# Patient Record
Sex: Male | Born: 1965 | Hispanic: No | Marital: Married
Health system: Midwestern US, Community
[De-identification: ages and names within clinical notes are randomized; demographics above are authoritative.]

## PROBLEM LIST (undated history)

## (undated) DIAGNOSIS — Z1211 Encounter for screening for malignant neoplasm of colon: Secondary | ICD-10-CM

## (undated) DIAGNOSIS — E119 Type 2 diabetes mellitus without complications: Secondary | ICD-10-CM

## (undated) DIAGNOSIS — E785 Hyperlipidemia, unspecified: Secondary | ICD-10-CM

## (undated) DIAGNOSIS — Z86018 Personal history of other benign neoplasm: Secondary | ICD-10-CM

## (undated) DIAGNOSIS — G4730 Sleep apnea, unspecified: Secondary | ICD-10-CM

## (undated) DIAGNOSIS — K219 Gastro-esophageal reflux disease without esophagitis: Secondary | ICD-10-CM

## (undated) DIAGNOSIS — I10 Essential (primary) hypertension: Secondary | ICD-10-CM

## (undated) HISTORY — DX: Gastro-esophageal reflux disease without esophagitis: K21.9

## (undated) HISTORY — DX: Essential (primary) hypertension: I10

## (undated) HISTORY — PX: TONSILLECTOMY AND ADENOIDECTOMY: SUR1326

## (undated) HISTORY — DX: Hyperlipidemia, unspecified: E78.5

## (undated) HISTORY — PX: BACK SURGERY: SHX140

---
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
## 1995-01-13 HISTORY — PX: CHOLECYSTECTOMY: SHX55

## 2002-05-16 ENCOUNTER — Encounter: Payer: Self-pay | Admitting: Family Medicine

## 2002-05-16 LAB — CONVERTED CEMR LAB
Blood Glucose, Fasting: 97 mg/dL
RBC count: 5.26 10*6/uL
TSH: 1.61 microintl units/mL
WBC, blood: 7.6 10*3/uL

## 2002-08-30 ENCOUNTER — Encounter: Payer: Self-pay | Admitting: Family Medicine

## 2002-08-30 LAB — CONVERTED CEMR LAB
Blood Glucose, Fasting: 103 mg/dL
RBC count: 5.05 10*6/uL
WBC, blood: 9.4 10*3/uL

## 2004-03-21 ENCOUNTER — Ambulatory Visit: Payer: Self-pay | Admitting: Chiropractic Medicine

## 2004-03-25 ENCOUNTER — Ambulatory Visit: Payer: Self-pay | Admitting: Family Medicine

## 2004-05-08 ENCOUNTER — Ambulatory Visit: Payer: Self-pay | Admitting: Family Medicine

## 2004-05-08 LAB — CONVERTED CEMR LAB
Blood Glucose, Fasting: 103 mg/dL
TSH: 1.89 u[IU]/mL

## 2004-05-12 HISTORY — PX: LUMBAR DISC SURGERY: SHX700

## 2004-05-13 ENCOUNTER — Ambulatory Visit: Payer: Self-pay | Admitting: Family Medicine

## 2004-05-21 ENCOUNTER — Ambulatory Visit (HOSPITAL_COMMUNITY): Admission: RE | Admit: 2004-05-21 | Discharge: 2004-05-22 | Payer: Self-pay | Admitting: Neurosurgery

## 2004-08-14 ENCOUNTER — Ambulatory Visit: Payer: Self-pay | Admitting: Family Medicine

## 2004-09-26 ENCOUNTER — Ambulatory Visit: Payer: Self-pay | Admitting: Family Medicine

## 2005-01-20 ENCOUNTER — Ambulatory Visit: Payer: Self-pay | Admitting: Family Medicine

## 2005-01-27 ENCOUNTER — Ambulatory Visit: Payer: Self-pay | Admitting: Family Medicine

## 2005-04-01 ENCOUNTER — Encounter: Payer: Self-pay | Admitting: Family Medicine

## 2005-04-01 LAB — CONVERTED CEMR LAB: PSA: 0.95 ng/mL

## 2005-04-21 ENCOUNTER — Ambulatory Visit: Payer: Self-pay | Admitting: Family Medicine

## 2005-08-25 ENCOUNTER — Ambulatory Visit: Payer: Self-pay | Admitting: Family Medicine

## 2005-08-25 LAB — CONVERTED CEMR LAB
Blood Glucose, Fasting: 101 mg/dL
TSH: 1.38 microintl units/mL

## 2005-08-27 ENCOUNTER — Ambulatory Visit: Payer: Self-pay | Admitting: Family Medicine

## 2005-12-17 ENCOUNTER — Ambulatory Visit: Payer: Self-pay | Admitting: Family Medicine

## 2006-08-26 ENCOUNTER — Encounter: Payer: Self-pay | Admitting: Family Medicine

## 2006-08-26 DIAGNOSIS — K589 Irritable bowel syndrome without diarrhea: Secondary | ICD-10-CM | POA: Insufficient documentation

## 2006-08-26 DIAGNOSIS — R7309 Other abnormal glucose: Secondary | ICD-10-CM | POA: Insufficient documentation

## 2006-08-26 DIAGNOSIS — K219 Gastro-esophageal reflux disease without esophagitis: Secondary | ICD-10-CM | POA: Insufficient documentation

## 2006-08-26 DIAGNOSIS — E785 Hyperlipidemia, unspecified: Secondary | ICD-10-CM | POA: Insufficient documentation

## 2006-08-26 DIAGNOSIS — F17200 Nicotine dependence, unspecified, uncomplicated: Secondary | ICD-10-CM | POA: Insufficient documentation

## 2006-08-26 DIAGNOSIS — K2940 Chronic atrophic gastritis without bleeding: Secondary | ICD-10-CM | POA: Insufficient documentation

## 2006-08-26 DIAGNOSIS — I10 Essential (primary) hypertension: Secondary | ICD-10-CM | POA: Insufficient documentation

## 2006-08-31 ENCOUNTER — Ambulatory Visit: Payer: Self-pay | Admitting: Family Medicine

## 2006-08-31 LAB — CONVERTED CEMR LAB
ALT: 37 units/L (ref 0–53)
AST: 34 units/L (ref 0–37)
Albumin: 4.2 g/dL (ref 3.5–5.2)
Alkaline Phosphatase: 35 units/L — ABNORMAL LOW (ref 39–117)
BUN: 15 mg/dL (ref 6–23)
Basophils Absolute: 0 10*3/uL (ref 0.0–0.1)
Basophils Relative: 0.2 % (ref 0.0–1.0)
Bilirubin, Direct: 0.1 mg/dL (ref 0.0–0.3)
CO2: 30 meq/L (ref 19–32)
Calcium: 9.1 mg/dL (ref 8.4–10.5)
Chloride: 107 meq/L (ref 96–112)
Cholesterol: 194 mg/dL (ref 0–200)
Creatinine, Ser: 1 mg/dL (ref 0.4–1.5)
Eosinophils Absolute: 0.3 10*3/uL (ref 0.0–0.6)
Eosinophils Relative: 4.1 % (ref 0.0–5.0)
GFR calc Af Amer: 106 mL/min
GFR calc non Af Amer: 88 mL/min
Glucose, Bld: 109 mg/dL — ABNORMAL HIGH (ref 70–99)
HCT: 41.4 % (ref 39.0–52.0)
HDL: 29.5 mg/dL — ABNORMAL LOW (ref >39.0)
Hemoglobin: 14 g/dL (ref 13.0–17.0)
LDL Cholesterol: 130 mg/dL — ABNORMAL HIGH (ref 0–99)
Lymphocytes Relative: 34.1 % (ref 12.0–46.0)
MCHC: 33.9 g/dL (ref 30.0–36.0)
MCV: 90.9 fL (ref 78.0–100.0)
Monocytes Absolute: 0.6 10*3/uL (ref 0.2–0.7)
Monocytes Relative: 8.8 % (ref 3.0–11.0)
Neutro Abs: 3.3 10*3/uL (ref 1.4–7.7)
Neutrophils Relative %: 52.8 % (ref 43.0–77.0)
Platelets: 203 10*3/uL (ref 150–400)
Potassium: 4.4 meq/L (ref 3.5–5.1)
RBC: 4.55 M/uL (ref 4.22–5.81)
RDW: 12.3 % (ref 11.5–14.6)
Sodium: 143 meq/L (ref 135–145)
TSH: 1.73 microintl units/mL (ref 0.35–5.50)
Total Bilirubin: 0.7 mg/dL (ref 0.3–1.2)
Total CHOL/HDL Ratio: 6.6
Total Protein: 6.4 g/dL (ref 6.0–8.3)
Triglycerides: 174 mg/dL — ABNORMAL HIGH (ref 0–149)
VLDL: 35 mg/dL (ref 0–40)
WBC: 6.3 10*3/uL (ref 4.5–10.5)

## 2006-09-03 ENCOUNTER — Ambulatory Visit: Payer: Self-pay | Admitting: Family Medicine

## 2007-02-24 ENCOUNTER — Telehealth: Payer: Self-pay | Admitting: Family Medicine

## 2007-09-08 ENCOUNTER — Ambulatory Visit: Payer: Self-pay | Admitting: Family Medicine

## 2007-09-12 ENCOUNTER — Ambulatory Visit: Payer: Self-pay | Admitting: Family Medicine

## 2007-09-12 LAB — CONVERTED CEMR LAB
ALT: 30 units/L (ref 0–53)
AST: 26 units/L (ref 0–37)
Albumin: 4.4 g/dL (ref 3.5–5.2)
Alkaline Phosphatase: 39 units/L (ref 39–117)
BUN: 23 mg/dL (ref 6–23)
Basophils Absolute: 0 10*3/uL (ref 0.0–0.1)
Basophils Relative: 0.5 % (ref 0.0–3.0)
Bilirubin, Direct: 0.1 mg/dL (ref 0.0–0.3)
CO2: 28 meq/L (ref 19–32)
Calcium: 9.4 mg/dL (ref 8.4–10.5)
Chloride: 108 meq/L (ref 96–112)
Cholesterol: 248 mg/dL (ref 0–200)
Creatinine, Ser: 1.1 mg/dL (ref 0.4–1.5)
Creatinine,U: 203.5 mg/dL
Direct LDL: 115.8 mg/dL
Eosinophils Absolute: 0.3 10*3/uL (ref 0.0–0.7)
Eosinophils Relative: 4.4 % (ref 0.0–5.0)
GFR calc Af Amer: 95 mL/min
GFR calc non Af Amer: 78 mL/min
Glucose, Bld: 98 mg/dL (ref 70–99)
HCT: 42 % (ref 39.0–52.0)
HDL: 36.8 mg/dL — ABNORMAL LOW (ref >39.0)
Hemoglobin: 14.6 g/dL (ref 13.0–17.0)
Lymphocytes Relative: 32.8 % (ref 12.0–46.0)
MCHC: 34.7 g/dL (ref 30.0–36.0)
MCV: 88.9 fL (ref 78.0–100.0)
Microalb Creat Ratio: 3.9 mg/g (ref 0.0–30.0)
Microalb, Ur: 0.8 mg/dL (ref 0.0–1.9)
Monocytes Absolute: 0.8 10*3/uL (ref 0.1–1.0)
Monocytes Relative: 11.4 % (ref 3.0–12.0)
Neutro Abs: 3.7 10*3/uL (ref 1.4–7.7)
Neutrophils Relative %: 50.9 % (ref 43.0–77.0)
Platelets: 194 10*3/uL (ref 150–400)
Potassium: 4.5 meq/L (ref 3.5–5.1)
RBC: 4.72 M/uL (ref 4.22–5.81)
RDW: 12.7 % (ref 11.5–14.6)
Sodium: 142 meq/L (ref 135–145)
TSH: 1.41 microintl units/mL (ref 0.35–5.50)
Total Bilirubin: 0.7 mg/dL (ref 0.3–1.2)
Total CHOL/HDL Ratio: 6.7
Total Protein: 6.9 g/dL (ref 6.0–8.3)
Triglycerides: 316 mg/dL (ref 0–149)
VLDL: 63 mg/dL — ABNORMAL HIGH (ref 0–40)
WBC: 7.2 10*3/uL (ref 4.5–10.5)

## 2008-02-23 ENCOUNTER — Telehealth: Payer: Self-pay | Admitting: Family Medicine

## 2008-06-05 ENCOUNTER — Ambulatory Visit: Payer: Self-pay | Admitting: Family Medicine

## 2008-09-05 ENCOUNTER — Ambulatory Visit: Payer: Self-pay | Admitting: Family Medicine

## 2008-09-05 LAB — CONVERTED CEMR LAB
ALT: 42 units/L (ref 0–53)
AST: 58 units/L — ABNORMAL HIGH (ref 0–37)
Albumin: 4.4 g/dL (ref 3.5–5.2)
Alkaline Phosphatase: 44 units/L (ref 39–117)
BUN: 17 mg/dL (ref 6–23)
Basophils Absolute: 0.1 10*3/uL (ref 0.0–0.1)
Basophils Relative: 1.2 % (ref 0.0–3.0)
Bilirubin, Direct: 0 mg/dL (ref 0.0–0.3)
CO2: 29 meq/L (ref 19–32)
Calcium: 9.3 mg/dL (ref 8.4–10.5)
Chloride: 105 meq/L (ref 96–112)
Cholesterol: 204 mg/dL — ABNORMAL HIGH (ref 0–200)
Creatinine, Ser: 1.1 mg/dL (ref 0.4–1.5)
Creatinine,U: 161.7 mg/dL
Direct LDL: 125.8 mg/dL
Eosinophils Absolute: 0.3 10*3/uL (ref 0.0–0.7)
Eosinophils Relative: 3.5 % (ref 0.0–5.0)
GFR calc non Af Amer: 77.87 mL/min (ref >60)
Glucose, Bld: 104 mg/dL — ABNORMAL HIGH (ref 70–99)
HCT: 43.8 % (ref 39.0–52.0)
HDL: 33.7 mg/dL — ABNORMAL LOW (ref >39.00)
Hemoglobin: 15 g/dL (ref 13.0–17.0)
Lymphocytes Relative: 23.7 % (ref 12.0–46.0)
Lymphs Abs: 1.9 10*3/uL (ref 0.7–4.0)
MCHC: 34.3 g/dL (ref 30.0–36.0)
MCV: 89.5 fL (ref 78.0–100.0)
Microalb Creat Ratio: 2.5 mg/g (ref 0.0–30.0)
Microalb, Ur: 0.4 mg/dL (ref 0.0–1.9)
Monocytes Absolute: 0.8 10*3/uL (ref 0.1–1.0)
Monocytes Relative: 9.9 % (ref 3.0–12.0)
Neutro Abs: 4.9 10*3/uL (ref 1.4–7.7)
Neutrophils Relative %: 61.7 % (ref 43.0–77.0)
Platelets: 184 10*3/uL (ref 150.0–400.0)
Potassium: 4.6 meq/L (ref 3.5–5.1)
RBC: 4.9 M/uL (ref 4.22–5.81)
RDW: 13.1 % (ref 11.5–14.6)
Sodium: 140 meq/L (ref 135–145)
TSH: 1.7 microintl units/mL (ref 0.35–5.50)
Total Bilirubin: 0.7 mg/dL (ref 0.3–1.2)
Total CHOL/HDL Ratio: 6
Total Protein: 7.2 g/dL (ref 6.0–8.3)
Triglycerides: 221 mg/dL — ABNORMAL HIGH (ref 0.0–149.0)
VLDL: 44.2 mg/dL — ABNORMAL HIGH (ref 0.0–40.0)
WBC: 8 10*3/uL (ref 4.5–10.5)

## 2008-09-06 LAB — CONVERTED CEMR LAB: Vit D, 25-Hydroxy: 21 ng/mL — ABNORMAL LOW (ref 30–89)

## 2008-09-10 ENCOUNTER — Ambulatory Visit: Payer: Self-pay | Admitting: Family Medicine

## 2008-12-14 ENCOUNTER — Ambulatory Visit: Payer: Self-pay | Admitting: Family Medicine

## 2008-12-16 LAB — CONVERTED CEMR LAB: Vit D, 25-Hydroxy: 12 ng/mL — ABNORMAL LOW (ref 30–89)

## 2008-12-19 ENCOUNTER — Ambulatory Visit: Payer: Self-pay | Admitting: Family Medicine

## 2008-12-19 DIAGNOSIS — E559 Vitamin D deficiency, unspecified: Secondary | ICD-10-CM | POA: Insufficient documentation

## 2008-12-26 ENCOUNTER — Encounter: Payer: Self-pay | Admitting: Family Medicine

## 2009-01-18 ENCOUNTER — Encounter: Payer: Self-pay | Admitting: Family Medicine

## 2009-01-22 DIAGNOSIS — C4359 Malignant melanoma of other part of trunk: Secondary | ICD-10-CM | POA: Insufficient documentation

## 2009-02-07 DIAGNOSIS — C439 Malignant melanoma of skin, unspecified: Secondary | ICD-10-CM

## 2009-02-07 HISTORY — DX: Malignant melanoma of skin, unspecified: C43.9

## 2009-03-18 ENCOUNTER — Ambulatory Visit: Payer: Self-pay | Admitting: Family Medicine

## 2009-03-19 LAB — CONVERTED CEMR LAB: Vit D, 25-Hydroxy: 30 ng/mL (ref 30–89)

## 2009-03-21 ENCOUNTER — Ambulatory Visit: Payer: Self-pay | Admitting: Family Medicine

## 2009-03-21 DIAGNOSIS — J301 Allergic rhinitis due to pollen: Secondary | ICD-10-CM | POA: Insufficient documentation

## 2009-06-17 ENCOUNTER — Ambulatory Visit: Payer: Self-pay | Admitting: Family Medicine

## 2009-06-18 LAB — CONVERTED CEMR LAB: Vit D, 25-Hydroxy: 35 ng/mL (ref 30–89)

## 2009-06-24 ENCOUNTER — Ambulatory Visit: Payer: Self-pay | Admitting: Family Medicine

## 2009-08-20 ENCOUNTER — Encounter (INDEPENDENT_AMBULATORY_CARE_PROVIDER_SITE_OTHER): Payer: Self-pay | Admitting: *Deleted

## 2009-09-19 ENCOUNTER — Ambulatory Visit: Payer: Self-pay | Admitting: Family Medicine

## 2009-09-19 LAB — CONVERTED CEMR LAB
PSA, Free Pct: 34 (ref >25)
PSA, Free: 0.2 ng/mL
PSA: 0.59 ng/mL (ref 0.10–4.00)

## 2009-09-24 ENCOUNTER — Encounter: Payer: Self-pay | Admitting: Family Medicine

## 2009-09-26 LAB — CONVERTED CEMR LAB
BUN: 16 mg/dL (ref 6–23)
CO2: 27 meq/L (ref 19–32)
Calcium: 9.3 mg/dL (ref 8.4–10.5)
Chloride: 104 meq/L (ref 96–112)
Cholesterol: 204 mg/dL — ABNORMAL HIGH (ref 0–200)
Creatinine, Ser: 1 mg/dL (ref 0.4–1.5)
Direct LDL: 133.9 mg/dL
GFR calc non Af Amer: 85.51 mL/min (ref >60)
Glucose, Bld: 106 mg/dL — ABNORMAL HIGH (ref 70–99)
HDL: 31.9 mg/dL — ABNORMAL LOW (ref >39.00)
Potassium: 4.7 meq/L (ref 3.5–5.1)
Sodium: 140 meq/L (ref 135–145)
Total CHOL/HDL Ratio: 6
Triglycerides: 219 mg/dL — ABNORMAL HIGH (ref 0.0–149.0)
VLDL: 43.8 mg/dL — ABNORMAL HIGH (ref 0.0–40.0)

## 2010-01-16 ENCOUNTER — Ambulatory Visit
Admission: RE | Admit: 2010-01-16 | Discharge: 2010-01-16 | Payer: Self-pay | Source: Home / Self Care | Attending: Family Medicine | Admitting: Family Medicine

## 2010-01-16 DIAGNOSIS — J018 Other acute sinusitis: Secondary | ICD-10-CM | POA: Insufficient documentation

## 2010-02-11 NOTE — Assessment & Plan Note (Signed)
Summary: 3 MONTH FOLLOW UP/[facility]   Vital Signs:  Patient profile:   45 year old male Weight:      265.75 pounds Temp:     98.6 degrees F oral Pulse rate:   76 / minute Pulse rhythm:   regular BP sitting:   134 / 80  (left arm) Cuff size:   large  Vitals Entered By: Sydell Axon LPN (March 21, 2009 9:00 AM) CC: 3 Month follow-up, has nasal congestion and right ear pain   History of Present Illness: Larry Edwards is here today for a 3 mo f/u on Vit D labs which were measured at 12 3 months ago.  He also has complaints of congestion and bilateral ear pain which is worse on the right side.  He also has watery eyes to the point of not wearing his contacts, some clear sputum production and some slight headaches.  The symptoms have lasted 3-4 days.  He denies any fever, cough, or sorethroat.  There is a history of allergies this time of the year.    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 Take 1 Tablet By Mouth Once A Day 2)  Vitamin D 1000 Unit Tabs (Cholecalciferol) .... Take Two By Mouth Daily 3)  Ergocalciferol 50000 Unit Caps (Ergocalciferol) .... One Tab By Mouth Weekly.  Allergies: No Known Drug Allergies  Physical Exam  General:  Well-developed,well-nourished,in no acute distress; alert,appropriate and cooperative throughout examination Head:  Normocephalic and atraumatic without obvious abnormalities. No apparent alopecia or balding. Eyes:  Conjunctiva clear bilat. Ears:  External ear exam shows no significant lesions or deformities.  Otoscopic examination  reveals clear canals, tympanic membranes are intact without bulging but show some dullness. Nose:  External nasal examination shows no deformity or inflammation. Nasal mucosa are pink and moist without lesions or exudates. Mouth:  Oral mucosa and oropharynx without lesions or exudates.  Teeth in good repair. Neck:  No deformities, masses, or tenderness noted. Lungs:  Normal respiratory effort, chest expands symmetrically. Lungs are clear to auscultation, no crackles or wheezes. Heart:  Normal rate and regular rhythm. S1 and S2 normal without gallop, murmur, click, rub or other extra sounds.   Impression & Recommendations:  Problem # 1:  VITAMIN D DEFICIENCY (ICD-268.9) Assessment Improved Will switch from weekly to two times a day daily.  Problem # 2:  HAY FEVER (ICD-477.0) Assessment: New Will try OTC Antihist and Tyl for ETD. May need Patanol if things worsen. Give a call.  Problem # 3:  HYPERTENSION (ICD-401.9) Assessment: Unchanged Good control. Cannot use NSAIDS for inflammation however. His updated medication list for this problem includes:    Atenolol 50 Mg Tabs (Atenolol) .Marland Kitchen... Take 1 tablet by mouth once a day  BP today: 134/80 Prior BP: 134/74 (12/19/2008)  Labs Reviewed: K+: 4.6 (09/05/2008) Creat: : 1.1 (09/05/2008)   Chol: 204 (09/05/2008)   HDL: 33.70 (09/05/2008)   LDL: DEL (09/08/2007)   TG: 221.0 (09/05/2008)  Complete Medication List: 1)  Atenolol 50 Mg Tabs (Atenolol) .... Take 1 tablet by mouth once a day 2)  Ergocalciferol 50000 Unit Caps (Ergocalciferol) .... One tab by mouth weekly.  Patient Instructions: 1)  RTC 3mos, Vit D lvl prior 2)  Take Vit D 1000Iu two times a day.  3)  Take Claritin, Allegra or Zyrtec daily  4)  Take Tyl ES 2 tabs by mouth three times a day   Current Allergies (reviewed today): No known allergies

## 2010-02-11 NOTE — Consult Note (Signed)
Summary: Dr[facility],Note  Dr[facility],Note   Imported By: Beau Fanny 01/23/2009 15:49:27  _____________________________________________________________________  External Attachment:    Type:   Image     Comment:   External Document  Appended Document: Dr[facility],Note    Clinical Lists Changes  Problems: Added new problem of MALIGNANT MELANOMA IN SITU OF BACK (ICD-172.5)

## 2010-02-11 NOTE — Assessment & Plan Note (Signed)
Summary: CPX/DR SCHALLER PT/RBH R/S FROM 9/6   Vital Signs:  Patient profile:   45 year old male Height:      70.50 inches Weight:      270 pounds BMI:     38.33 Temp:     99.3 degrees F oral Pulse rate:   76 / minute Pulse rhythm:   regular BP sitting:   122 / 82  (right arm) Cuff size:   large  Vitals Entered By: Linde Gillis CMA Duncan Dull) (September 19, 2009 8:23 AM) CC: complete physicial, Schaller patient   History of Present Illness: 45 yo pt new to me here for CPX with no complaints.  Vitamin D deficiency- records reviewed, at one point Vit D was 12, most recently 35.  Feels much less tired.  Still taking 1000 International Units two times a day.  PSA was offered at work, wonders if he should get one while he is here. Has not had labs drawn since last year.  Denies any difficulty starting stream, weak stream or nocturia.  Melanoma insitu- s/p wide resection.  Doing well.  Current Medications (verified): 1)  Atenolol 50 Mg Tabs (Atenolol) .... Take 1 Tablet By Mouth Once A Day 2)  Vitamin D 1000 Unit Tabs (Cholecalciferol) .... Take One By Mouth Two Times A Day  Allergies (verified): No Known Drug Allergies  Past History:  Past Medical History: Last updated: 08/26/2006 [id number] Hyperlipidemia: 04/2002 Hypertension: 05/2002  Past Surgical History: Last updated: 09/03/2006 TONSILS AND ADNEOIDS REMOVED AT AGE 53 LUMBAR, L5-S1:(12/02/1995) CHOLECYSTECTOMY:(01/1995) BARIUM ENEMA NML EGD:(04/1995) NML ABD. ULTRASOUND : NORMAL(DR,SIEGAL):04/1995 LUMBAR SURGERY , DISECTOMY:(05/2004)  Family History: Last updated: 09/13/2008 Father:DECEASED 51 YOA CHF Mother: DECEASED 49 YOA, Ovarian Ca  SISTER A 38 CV: + FATHER HBP: NEGATIVE DM: + MOTHER, + FATHER GOUT/ARTHRITIS: PROSTATE CANCER: BREAST/OVARIAN/UTERINE CANCER:  COLON CANCER: DEPRESSION: NEGATIVE ETOH/DRUG ABUSE: NEGATIVE OTHER: STROKE - NEGATIVE  Social History: Last updated: 08/26/2006 Marital  Status: Married 02/2005 Children:NONE  Occupation:[employer] [place]   Risk Factors: Alcohol Use: <1 (2008-09-13) Caffeine Use: 4 (09-13-2008) Exercise: yes (Sep 13, 2008)  Risk Factors: Smoking Status: current (Sep 13, 2008) Packs/Day: <1 PPD SINCE 23 YOA (September 13, 2008) Cans of tobacco/wk: no (2008/09/13) Passive Smoke Exposure: no (09-13-2008)  Review of Systems      See HPI General:  Denies malaise. Eyes:  Denies blurring. ENT:  Denies difficulty swallowing. CV:  Denies chest pain or discomfort. Resp:  Denies shortness of breath. GI:  Denies abdominal pain, bloody stools, and change in bowel habits. GU:  Denies dysuria, erectile dysfunction, urinary frequency, and urinary hesitancy. MS:  Denies joint pain, joint redness, and joint swelling. Derm:  Denies rash. Neuro:  Denies weakness. Psych:  Denies anxiety and depression. Endo:  Denies cold intolerance and heat intolerance. Heme:  Denies abnormal bruising and bleeding.  Physical Exam  General:  Well-developed,well-nourished,in no acute distress; alert,appropriate and cooperative throughout examination Head:  Normocephalic and atraumatic without obvious abnormalities. No apparent alopecia or balding. Eyes:  Conjunctiva clear bilat. Ears:  External ear exam shows no significant lesions or deformities.  Otoscopic examination reveals clear canals, tympanic membranes are intact without bulging but show some dullness. Nose:  External nasal examination shows no deformity or inflammation. Nasal mucosa are pink and moist without lesions or exudates. Mouth:  Oral mucosa and oropharynx without lesions or exudates.  Teeth in good repair. Lungs:  Normal respiratory effort, chest expands symmetrically. Lungs are clear to auscultation, no crackles or wheezes. Heart:  Normal rate and regular rhythm. S1  and S2 normal without gallop, murmur, click, rub or other extra sounds. Abdomen:  Bowel sounds positive,abdomen soft and non-tender  without masses, organomegaly or hernias noted. Mildly obese and protuberant. Msk:  No deformity or scoliosis noted of thoracic or lumbar spine.   Extremities:  No clubbing, cyanosis, edema, or deformity noted with normal full range of motion of all joints.   Neurologic:  No cranial nerve deficits noted. Station and gait are normal. Plantar reflexes are down-going bilaterally. DTRs are symmetrical throughout. Sensory, motor and coordinative functions appear intact. Skin:  Intact without suspicious lesions or rashes but for two lesions on the back,large well healed scar on upper back (right) Psych:  Cognition and judgment appear intact. Alert and cooperative with normal attention span and concentration. No apparent delusions, illusions, hallucinations   Impression & Recommendations:  Problem # 1:  Preventive Health Care (ICD-V70.0) Reviewed preventive care protocols, scheduled due services, and updated immunizations Discussed nutrition, exercise, diet, and healthy lifestyle.  FLP, BMET, PSA today.  Problem # 2:  VITAMIN D DEFICIENCY (ICD-268.9) Assessment: Unchanged stable on 1000 International Units two times a day.  Complete Medication List: 1)  Atenolol 50 Mg Tabs (Atenolol) .... Take 1 tablet by mouth once a day 2)  Vitamin D 1000 Unit Tabs (Cholecalciferol) .... Take one by mouth two times a day  Other Orders: Venipuncture ([address]) TLB-Lipid Panel (80061-LIPID) TLB-BMP (Basic Metabolic Panel-BMET) (80048-METABOL) T-PSA Total ([address])  Current Allergies (reviewed today): No known allergies

## 2010-02-11 NOTE — Letter (Signed)
Summary: Nadara Eaton letter  [place] at [facility]  [address] [place], [place] [address]   Phone: [phone]  Fax: [phone]       08/20/2009 MRN: [id number]  DAQUAWN SEELMAN [address] [place], [place]  [address]  Dear Mr. [facility] announce the retirement of Arta Silence, M.D., from full-time practice at the [facility] office effective July 11, 2009 and his plans of returning part-time.  It is important to Dr. Hetty Ely and to our practice that you understand that [facility] has seven physicians in our office for your health care needs.  We will continue to offer the same exceptional care that you have today.    Dr. Hetty Ely has spoken to many of you about his plans for retirement and returning part-time in the fall.   We will continue to work with you through the transition to schedule appointments for you in the office and meet the high standards that [facility] is committed to.   Again, it is with great pleasure that we share the news that Dr. Hetty Ely will return to [facility] in October of 2011 with a reduced schedule.    If you have any questions, or would like to request an appointment with one of our physicians, please call us at [phone] and press the option for Scheduling an appointment.  We take pleasure in providing you with excellent patient care and look forward to seeing you at your next office visit.  Our [facility] Physicians are:  Tillman Abide, M.D. Laurita Quint, M.D. Roxy Manns, M.D. Kerby Nora, M.D. Hannah Beat, M.D. Ruthe Mannan, M.D. We proudly welcomed Raechel Ache, M.D. and Eustaquio Boyden, M.D. to the practice in July/August 2011.  Sincerely,  [facility]

## 2010-02-11 NOTE — Progress Notes (Signed)
Summary: refill atenolol  Phone Note Refill Request Message from:  Fax from Pharmacy  Refills Requested: Medication #1:  ATENOLOL 50 MG TABS Take 1 tablet by mouth once a day. Faxed form from [facility] is on your shelf.  Initial call taken by: Lowella Petties,  February 23, 2008 8:48 AM  Follow-up for Phone Call        signed.  Additional Follow-up for Phone Call Additional follow up Details #1::        Faxed to [facility]. Additional Follow-up by: Lowella Petties,  February 23, 2008 9:12 AM

## 2010-02-11 NOTE — Letter (Signed)
Summary: Generic Letter  [place] at [facility]  [address] [place], [place] [address]   Phone: [phone]  Fax: [phone]    09/24/2009  ADOM SCHOENECK [address] [place], [place]  [address]  Dear Mr. Whiston,     We have been unable to reach you by telephone.  Please call our office at your earliest convenience.  When calling ask to speak with Lowella Bandy, Medical Assistant for Dr. Ruthe Mannan.        Sincerely,  Linde Gillis CMA (AAMA), for  Dr. Ruthe Mannan, MD

## 2010-02-11 NOTE — Assessment & Plan Note (Signed)
Summary: [email or web address]   Vital Signs:  Patient profile:   45 year old male Height:      70.50 inches Weight:      265 pounds BMI:     37.62 Temp:     98.8 degrees F oral Pulse rate:   72 / minute Pulse rhythm:   regular BP sitting:   130 / 60  (left arm) Cuff size:   large  Vitals Entered By: Providence Crosby LPN (September 21, 2008 2:51 PM) CC: check up   History of Present Illness: Pt here for pinched nerve in the back, seeing a Chiropracter for that also. He also has some plantar fascitis and has seen a podiatrist who showed him stretching.  Preventive Screening-Counseling & Management  Alcohol-Tobacco     Alcohol drinks/day: <1     Alcohol type: occas beer 1/mo     Smoking Status: current     Smoking Cessation Counseling: no     Packs/Day: <1 PPD SINCE 23 YOA     Year Started: 1988     Cans of tobacco/week: no     Passive Smoke Exposure: no  Caffeine-Diet-Exercise     Caffeine use/day: 4     Does Patient Exercise: yes     Type of exercise: gym     Times/week: 3  Problems Prior to Update: 1)  Uri  (ICD-465.9) 2)  Examination, Routine Medical  (ICD-V70.0) 3)  Hyperglycemia  (ICD-790.29) 4)  Tobacco Abuse  (ICD-305.1) 5)  Gastritis, Chronic  (ICD-535.10) 6)  Irritable Bowel Syndrome  (ICD-564.1) 7)  Hypertension  (ICD-401.9) 8)  Hyperlipidemia  (ICD-272.4) 9)  Gerd  (ICD-530.81)  Medications Prior to Update: 1)  Atenolol 50 Mg Tabs (Atenolol) .... Take 1 Tablet By Mouth Once A Day 2)  Augmentin 500-125 Mg Tabs (Amoxicillin-Pot Clavulanate) .... One Tab By Mouth Three Times A Day  Allergies (verified): No Known Drug Allergies  Past History:  Past Medical History: Last updated: 08/26/2006 [id number] Hyperlipidemia: 04/2002 Hypertension: 05/2002  Past Surgical History: Last updated: 09/03/2006 TONSILS AND ADNEOIDS REMOVED AT AGE 42 LUMBAR, L5-S1:(12/02/1995) CHOLECYSTECTOMY:(01/1995) BARIUM ENEMA NML EGD:(04/1995) NML ABD. ULTRASOUND : NORMAL(DR,SIEGAL):04/1995  LUMBAR SURGERY , DISECTOMY:(05/2004)  Family History: Last updated: September 21, 2008 Father:DECEASED 17 YOA CHF Mother: DECEASED 58 YOA, Ovarian Ca  SISTER A 38 CV: + FATHER HBP: NEGATIVE DM: + MOTHER, + FATHER GOUT/ARTHRITIS: PROSTATE CANCER: BREAST/OVARIAN/UTERINE CANCER:  COLON CANCER: DEPRESSION: NEGATIVE ETOH/DRUG ABUSE: NEGATIVE OTHER: STROKE - NEGATIVE  Social History: Last updated: 08/26/2006 Marital Status: Married 02/2005 Children:NONE  Occupation:[employer] - [place]   Risk Factors: Alcohol Use: <1 (09/21/2008) Caffeine Use: 4 (Sep 21, 2008) Exercise: yes (2008/09/21)  Risk Factors: Smoking Status: current (2008-09-21) Packs/Day: <1 PPD SINCE 23 YOA (2008-09-21) Cans of tobacco/wk: no (09/21/2008) Passive Smoke Exposure: no (Sep 21, 2008)  Family History: Father:DECEASED 6 YOA CHF Mother: DECEASED 70 YOA, Ovarian Ca  SISTER A 38 CV: + FATHER HBP: NEGATIVE DM: + MOTHER, + FATHER GOUT/ARTHRITIS: PROSTATE CANCER: BREAST/OVARIAN/UTERINE CANCER:  COLON CANCER: DEPRESSION: NEGATIVE ETOH/DRUG ABUSE: NEGATIVE OTHER: STROKE - NEGATIVE  Social History: Packs/Day:  <1 PPD SINCE 23 YOA Caffeine use/day:  4 Does Patient Exercise:  yes  Review of Systems General:  Denies chills, fatigue, fever, loss of appetite, malaise, sleep disorder, sweats, weakness, and weight loss. Eyes:  Denies blurring, discharge, double vision, eye irritation, eye pain, halos, itching, light sensitivity, red eye, vision loss-1 eye, and vision loss-both eyes. ENT:  Denies decreased hearing, difficulty swallowing, ear discharge, earache, hoarseness, nasal congestion, nosebleeds, postnasal drainage, ringing in  ears, sinus pressure, and sore throat. CV:  Denies bluish discoloration of lips or nails, chest pain or discomfort, difficulty breathing at night, difficulty breathing while lying down, fainting, fatigue, leg cramps with exertion, lightheadness, near fainting, palpitations, shortness of  breath with exertion, swelling of feet, swelling of hands, and weight gain. Resp:  Denies chest discomfort, chest pain with inspiration, cough, coughing up blood, excessive snoring, hypersomnolence, morning headaches, pleuritic, shortness of breath, sputum productive, and wheezing. GI:  Denies abdominal pain, bloody stools, change in bowel habits, constipation, dark tarry stools, diarrhea, excessive appetite, gas, hemorrhoids, indigestion, loss of appetite, nausea, vomiting, vomiting blood, and yellowish skin color. GU:  Denies decreased libido, discharge, dysuria, erectile dysfunction, genital sores, hematuria, incontinence, nocturia, urinary frequency, and urinary hesitancy. MS:  Complains of joint pain; sere HPI. Derm:  Denies changes in color of skin, changes in nail beds, dryness, excessive perspiration, flushing, hair loss, insect bite(s), itching, lesion(s), poor wound healing, and rash. Neuro:  Denies brief paralysis, difficulty with concentration, disturbances in coordination, falling down, headaches, inability to speak, memory loss, numbness, poor balance, seizures, sensation of room spinning, tingling, tremors, visual disturbances, and weakness.   Impression & Recommendations:  Problem # 1:  EXAMINATION, ROUTINE MEDICAL (ICD-V70.0) Assessment Comment Only  Problem # 2:  HYPERGLYCEMIA (ICD-790.29) Assessment: Unchanged  Stable, watch sweets and carbs.  Labs Reviewed: Creat: 1.1 (09/05/2008)     Problem # 3:  TOBACCO ABUSE (ICD-305.1) Assessment: Improved Down to 3/4 pack. Try to cont decreasing to none. If I can do anything, let me know.  Problem # 4:  GASTRITIS, CHRONIC (ICD-535.10) Assessment: Unchanged  Stable.  Discussed use of medication, as well as lifestyle changes.   Problem # 5:  IRRITABLE BOWEL SYNDROME (ICD-564.1) Assessment: Unchanged Well controlled.  Problem # 6:  HYPERTENSION (ICD-401.9) Assessment: Improved Stable, cont curr meds. His updated  medication list for this problem includes:    Atenolol 50 Mg Tabs (Atenolol) .Marland Kitchen... Take 1 tablet by mouth once a day  BP today: 130/60 Prior BP: 140/70 (06/05/2008)  Labs Reviewed: K+: 4.6 (09/05/2008) Creat: : 1.1 (09/05/2008)   Chol: 204 (09/05/2008)   HDL: 33.70 (09/05/2008)   LDL: DEL (09/08/2007)   TG: 221.0 (09/05/2008)  Problem # 7:  HYPERLIPIDEMIA (ICD-272.4) Assessment: Unchanged Adequate but can be improved...discussed approaches for Trig, LDL, and HDL Labs Reviewed: SGOT: 58 (09/05/2008)   SGPT: 42 (09/05/2008)   HDL:33.70 (09/05/2008), 36.8 (09/08/2007)  LDL:DEL (09/08/2007), 130 (08/31/2006)  Chol:204 (09/05/2008), 248 (09/08/2007)  Trig:221.0 (09/05/2008), 316 (09/08/2007)  Problem # 8:  GERD (ICD-530.81) Assessment: Unchanged Stable.  Complete Medication List: 1)  Atenolol 50 Mg Tabs (Atenolol) .... Take 1 tablet by mouth once a day  Patient Instructions: 1)  Take Vit D 1000IU twice a day. 2)  RTC 3 mos for recheck Vit D lvl prior 286.9  Appended Document: [email or web address].dlo    Clinical Lists Changes  Observations: Added new observation of PSYCH COMM: Cognition and judgment appear intact. Alert and cooperative with normal attention span and concentration. No apparent delusions, illusions, hallucinations (09/10/2008 15:17) Added new observation of INGUIN NODES: No significant adenopathy (09/10/2008 15:17) Added new observation of CERVIC NODES: No lymphadenopathy noted (09/10/2008 15:17) Added new observation of INSTRUCTIONS: See Derm. (09/10/2008 15:17) Added new observation of SKIN SQ INSP: Intact without suspicious lesions or rashes but for two lesions on the back, one variously hyperpigmented upper back above scapulae on right side, second lumbar area midline like vascular confluence. (09/10/2008 15:17) Added new observation of NEURO EXAM:  No cranial nerve deficits noted. Station and gait are normal. Plantar reflexes are down-going bilaterally. DTRs are symmetrical  throughout. Sensory, motor and coordinative functions appear intact. (09/10/2008 15:17) Added new observation of EXTREMITIES: No clubbing, cyanosis, edema, or deformity noted with normal full range of motion of all joints.   (09/10/2008 15:17) Added new observation of PEDAL PULSE: R and L carotid,radial,femoral,dorsalis pedis and posterior tibial pulses are full and equal bilaterally (09/10/2008 15:17) Added new observation of MSK EXAM: No deformity or scoliosis noted of thoracic or lumbar spine.   (09/10/2008 15:17) Added new observation of PROSTATEEXAM: Prostate gland firm and smooth, no enlargement, nodularity, tenderness, mass, asymmetry or induration. 10-20gms. (09/10/2008 15:17) Added new observation of GU EXAM GEN: Testes bilaterally descended without nodularity, tenderness or masses. No scrotal masses or lesions. No penis lesions or urethral discharge. (09/10/2008 15:17) Added new observation of RECTAL EXAM: No external abnormalities noted. Normal sphincter tone. No rectal masses or tenderness. G neg. (09/10/2008 15:17) Added new observation of ABDOMEN EXAM: Bowel sounds positive,abdomen soft and non-tender without masses, organomegaly or hernias noted. Mildly obese and protuberant. (09/10/2008 15:17) Added new observation of HEART EXAM: Normal rate and regular rhythm. S1 and S2 normal without gallop, murmur, click, rub or other extra sounds. (09/10/2008 15:17) Added new observation of LUNG EXAM: Normal respiratory effort, chest expands symmetrically. Lungs are clear to auscultation, no crackles or wheezes. (09/10/2008 15:17) Added new observation of BREAST INSP: No masses or gynecomastia noted (09/10/2008 15:17) Added new observation of PE CHST WALL: No deformities, masses, tenderness or gynecomastia noted. (09/10/2008 15:17) Added new observation of NECK EXAM: No deformities, masses, or tenderness noted. (09/10/2008 15:17) Added new observation of ORAL EXAM: Oral mucosa and oropharynx without  lesions or exudates.  Teeth in good repair. (09/10/2008 15:17) Added new observation of NOSE EXAM: External nasal examination shows no deformity or inflammation. Nasal mucosa are pink and moist without lesions or exudates but inflamed. (09/10/2008 15:17) Added new observation of EAR EXAM: External ear exam shows no significant lesions or deformities.  Otoscopic examination reveals clear canals, tympanic membranes are intact bilaterally without bulging, retraction, inflammation or discharge. Hearing is grossly normal bilaterally. (09/10/2008 15:17) Added new observation of EYE EXAM: Conjunctiva clear bilaterally.  (09/10/2008 15:17) Added new observation of HD/FACE INSP: Normocephalic and atraumatic without obvious abnormalities. No apparent alopecia or balding. Sinuses NT. (09/10/2008 15:17) Added new observation of PEADULT: Shaune Leeks MD ~General`Gen appear ~Head`hd/face insp ~Eyes`Eye exam ~Ears`Ear exam ~Nose`Nose exam ~Mouth`Oral exam ~Neck`NECK EXAM ~Chest Wall`PE Chst Wall ~Breasts`Breast Insp ~Lungs`lung exam ~Heart`Heart exam ~Abdomen`Abdomen exam ~Rectal`Rectal exam ~Genitalia`GU exam gen ~Prostate`ProstateExam ~Msk`MSK EXAM ~Pulses`pedal pulse ~Extremities`Extremities ~Neurologic`Neuro exam ~Skin`skin sq insp ~Cervical Nodes`cervic nodes ~Inguinal Nodes`inguin nodes ~Psych`psych comm (09/10/2008 15:17) Added new observation of GEN APPEAR: Well-developed,well-nourished,in no acute distress; alert,appropriate and cooperative throughout examination, carries his weight well and does not appear obese. (09/10/2008 15:17)        Physical Exam  General:  Well-developed,well-nourished,in no acute distress; alert,appropriate and cooperative throughout examination, carries his weight well and does not appear obese. Head:  Normocephalic and atraumatic without obvious abnormalities. No apparent alopecia or balding. Sinuses NT. Eyes:  Conjunctiva clear bilaterally.  Ears:  External ear  exam shows no significant lesions or deformities.  Otoscopic examination reveals clear canals, tympanic membranes are intact bilaterally without bulging, retraction, inflammation or discharge. Hearing is grossly normal bilaterally. Nose:  External nasal examination shows no deformity or inflammation. Nasal mucosa are pink and moist without lesions or exudates but inflamed. Mouth:  Oral mucosa and oropharynx without lesions or exudates.  Teeth in good repair. Neck:  No deformities, masses, or tenderness noted. Chest Wall:  No deformities, masses, tenderness or gynecomastia noted. Breasts:  No masses or gynecomastia noted Lungs:  Normal respiratory effort, chest expands symmetrically. Lungs are clear to auscultation, no crackles or wheezes. Heart:  Normal rate and regular rhythm. S1 and S2 normal without gallop, murmur, click, rub or other extra sounds. Abdomen:  Bowel sounds positive,abdomen soft and non-tender without masses, organomegaly or hernias noted. Mildly obese and protuberant. Rectal:  No external abnormalities noted. Normal sphincter tone. No rectal masses or tenderness. G neg. Genitalia:  Testes bilaterally descended without nodularity, tenderness or masses. No scrotal masses or lesions. No penis lesions or urethral discharge. Prostate:  Prostate gland firm and smooth, no enlargement, nodularity, tenderness, mass, asymmetry or induration. 10-20gms. Msk:  No deformity or scoliosis noted of thoracic or lumbar spine.   Pulses:  R and L carotid,radial,femoral,dorsalis pedis and posterior tibial pulses are full and equal bilaterally Extremities:  No clubbing, cyanosis, edema, or deformity noted with normal full range of motion of all joints.   Neurologic:  No cranial nerve deficits noted. Station and gait are normal. Plantar reflexes are down-going bilaterally. DTRs are symmetrical throughout. Sensory, motor and coordinative functions appear intact. Skin:  Intact without suspicious lesions or  rashes but for two lesions on the back, one variously hyperpigmented upper back above scapulae on right side, second lumbar area midline like vascular confluence. Cervical Nodes:  No lymphadenopathy noted Inguinal Nodes:  No significant adenopathy Psych:  Cognition and judgment appear intact. Alert and cooperative with normal attention span and concentration. No apparent delusions, illusions, hallucinations   Patient Instructions: 1)  See Derm.

## 2010-02-11 NOTE — Assessment & Plan Note (Signed)
Summary: 3 MONTHF OLLOW UP/[facility]   Vital Signs:  Patient profile:   45 year old male Weight:      269 pounds BMI:     38.19 Temp:     99.0 degrees F oral Pulse rate:   80 / minute Pulse rhythm:   regular BP sitting:   118 / 78  (left arm) Cuff size:   large  Vitals Entered By: Sydell Axon LPN (June 24, 2009 9:02 AM) CC: 3 Month follow-up   History of Present Illness: Pt here for 3 month f/u. He took Loratidine for the hay fever and has done well. Now no longer on it.  he has foot pain...does stretching. Overall he feel well and has no complaints. He feels slightly less fatigued with Vit D replacement.   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 Take 1 Tablet By Mouth Once A Day 2)  Ergocalciferol 50000 Unit Caps (Ergocalciferol) .... One Tab By Mouth Weekly.  Allergies: No Known Drug Allergies  Physical Exam  General:  Well-developed,well-nourished,in no acute distress; alert,appropriate and cooperative throughout examination Head:  Normocephalic and atraumatic without obvious abnormalities. No apparent alopecia or balding. Eyes:  Conjunctiva clear bilat. Ears:  External ear exam shows no significant lesions or deformities.  Otoscopic examination reveals clear canals, tympanic membranes are intact without bulging but show some dullness. Nose:  External nasal examination shows no deformity or inflammation. Nasal mucosa are pink and moist without lesions or exudates. Mouth:  Oral mucosa and oropharynx without lesions or exudates.   Teeth in good repair. Neck:  No deformities, masses, or tenderness noted. Chest Wall:  No deformities, masses, tenderness or gynecomastia noted. Lungs:  Normal respiratory effort, chest expands symmetrically. Lungs are clear to auscultation, no crackles or wheezes. Heart:  Normal rate and regular rhythm. S1 and S2 normal without gallop, murmur, click, rub or other extra sounds.   Impression & Recommendations:  Problem # 1:  HAY FEVER (ICD-477.0) Assessment Improved Season over. He had good effect with Claritin OTC Generic. Discussed anticipating next year.  Problem # 2:  VITAMIN D DEFICIENCY (ICD-268.9) Assessment: Improved Still therapeutic now on OTC meds. Cont two times a day.  Problem # 3:  HYPERTENSION (ICD-401.9) Assessment: Unchanged Stable. His updated medication list for this problem includes:    Atenolol 50 Mg Tabs (Atenolol) .Marland Kitchen... Take 1 tablet by mouth once a day  BP today: 118/78 Prior BP: 134/80 (03/21/2009)  Labs Reviewed: K+: 4.6 (09/05/2008) Creat: : 1.1 (09/05/2008)   Chol: 204 (09/05/2008)   HDL: 33.70 (09/05/2008)   LDL: DEL (09/08/2007)   TG: 221.0 (09/05/2008)  Complete Medication List: 1)  Atenolol 50 Mg Tabs (Atenolol) .... Take 1 tablet by mouth once a day 2)  Vitamin D 1000 Unit Tabs (Cholecalciferol) .... Take one by mouth two times a day  Patient Instructions: 1)  RTC as scheduled with Dr Dayton Martes.  Current Allergies (reviewed today): No known allergies

## 2010-02-11 NOTE — Assessment & Plan Note (Signed)
Summary: 3 MONTH F/U/[facility] R/S FROM 12/18/08   Vital Signs:  Patient profile:   45 year old male Weight:      265.31 pounds Temp:     98.4 degrees F oral Pulse rate:   76 / minute Pulse rhythm:   regular BP sitting:   134 / 74  (left arm) Cuff size:   large  Vitals Entered By: Sydell Axon LPN (December 19, 2008 9:00 AM) CC: Cold X 2 weeks, cough, congestion   History of Present Illness: Pt here for recheck of Vit D....has been taking 1000 two times a day since seen 3 mos ago when he was at 77, now at 38!!  He has a cold now. He is taking Guaifenesin as directed. It has staarted breaking up and he is fee eling better. Congestion with minimal cough, no headache, reasonably clear discharge.  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 Take 1 Tablet By Mouth Once A Day  Allergies: No Known Drug Allergies  Physical Exam  General:  Well-developed,well-nourished,in no acute distress; alert,appropriate and cooperative throughout examination, carries his weight well and does not appear obese. Head:  Normocephalic and atraumatic without obvious abnormalities. No apparent alopecia or balding. Sinuses NT. Eyes:  Conjunctiva clear bilaterally.  Ears:  External ear exam shows no significant lesions or deformities.  Otoscopic examination reveals clear canals, tympanic membranes are intact bilaterally without bulging, retraction, inflammation or discharge. Hearing is grossly normal bilaterally. Nose:  External nasal examination shows no deformity or inflammation. Nasal mucosa are pink and moist without lesions or  exudates but inflamed bilat, clear discharge.. Mouth:  Oral mucosa and oropharynx without lesions or exudates.  Teeth in good repair. Mild PND. Neck:  No deformities, masses, or tenderness noted. Lungs:  Normal respiratory effort, chest expands symmetrically. Lungs are clear to auscultation, no crackles or wheezes. Heart:  Normal rate and regular rhythm. S1 and S2 normal without gallop, murmur, click, rub or other extra sounds.   Impression & Recommendations:  Problem # 1:  VITAMIN D DEFICIENCY (ICD-268.9) Assessment Deteriorated Go to 50000Iu weekly. Script given.  Recheck 3 mos again.  Problem # 2:  URI (ICD-465.9) Assessment: New Cont guaifenesin. Call if worsens.  Complete Medication List: 1)  Atenolol 50 Mg Tabs (Atenolol) .... Take 1 tablet by mouth once a day 2)  Vitamin D 1000 Unit Tabs (Cholecalciferol) .... Take two by mouth daily 3)  Ergocalciferol 50000 Unit Caps (Ergocalciferol) .... One tab by mouth weekly.  Patient Instructions: 1)  Start Vit D 50000Iu weekly. 2)  RTC 3 mos, Vit D prior 268.9 Prescriptions: ERGOCALCIFEROL 50000 UNIT CAPS (ERGOCALCIFEROL) one tab by mouth weekly.  #12 x 1   Entered and Authorized by:   Shaune Leeks MD   Signed by:   Shaune Leeks MD on 12/19/2008   Method used:   Print then Give to Patient   RxID:   [id number] ERGOCALCIFEROL 50000 UNIT CAPS (ERGOCALCIFEROL) one tab by mouth weekly.  #4 x 4   Entered and Authorized by:   Shaune Leeks MD   Signed by:   Shaune Leeks  MD on 12/19/2008   Method used:   Electronically to        [facility]  [address] (retail)       [address]       [place], [place]  [address]       Ph: [phone] or [phone]       Fax: [phone]   RxID:   [id number]   Current Allergies (reviewed today): No known allergies

## 2010-02-13 NOTE — Assessment & Plan Note (Signed)
Summary: head cold/alc   Vital Signs:  Patient profile:   45 year old male Height:      70.50 inches Weight:      276.75 pounds BMI:     39.29 Temp:     98.9 degrees F oral Pulse rate:   69 / minute Pulse rhythm:   regular BP sitting:   120 / 80  (left arm) Cuff size:   large  Vitals Entered By: Linde Gillis CMA Duncan Dull) (January 16, 2010 9:41 AM) CC: head cold   History of Present Illness: 45 yo here for head cold.  Started over one week ago with runny nose, dry cough. Now has progressed to purulent nasal discharge, sinus pressure, productive cough. Has chills, subjective fevers. Taking Mucinex and Naproxen. Feels symptoms are getting worse. No CP or SOB.  Current Medications (verified): 1)  Atenolol 50 Mg Tabs (Atenolol) .... Take 1 Tablet By Mouth Once A Day 2)  Vitamin D 1000 Unit Tabs (Cholecalciferol) .... Take One By Mouth Two Times A Day 3)  Augmentin 875-125 Mg Tabs (Amoxicillin-Pot Clavulanate) .Marland Kitchen.. 1 By Mouth 2 Times Daily X 10 Days  Allergies (verified): No Known Drug Allergies  Past History:  Past Medical History: Last updated: 08/26/2006 [id number] Hyperlipidemia: 04/2002 Hypertension: 05/2002  Past Surgical History: Last updated: 09/03/2006 TONSILS AND ADNEOIDS REMOVED AT AGE 34 LUMBAR, L5-S1:(12/02/1995) CHOLECYSTECTOMY:(01/1995) BARIUM ENEMA NML EGD:(04/1995) NML ABD. ULTRASOUND : NORMAL(DR,SIEGAL):04/1995 LUMBAR SURGERY , DISECTOMY:(05/2004)  Family History: Last updated: September 21, 2008 Father:DECEASED 74 YOA CHF Mother: DECEASED 48 YOA, Ovarian Ca  SISTER A 38 CV: + FATHER HBP: NEGATIVE DM: + MOTHER, + FATHER GOUT/ARTHRITIS: PROSTATE CANCER: BREAST/OVARIAN/UTERINE CANCER:  COLON CANCER: DEPRESSION: NEGATIVE ETOH/DRUG ABUSE: NEGATIVE OTHER: STROKE - NEGATIVE  Social History: Last updated: 08/26/2006 Marital Status: Married 02/2005 Children:NONE  Occupation:[employer] - [place]   Risk Factors: Alcohol Use: <1  (September 21, 2008) Caffeine Use: 4 (09-21-2008) Exercise: yes (21-Sep-2008)  Risk Factors: Smoking Status: current (2008/09/21) Packs/Day: <1 PPD SINCE 23 YOA (09-21-2008) Cans of tobacco/wk: no (Sep 21, 2008) Passive Smoke Exposure: no (09/21/2008)  Review of Systems      See HPI General:  Complains of chills and fever. ENT:  Complains of earache, nasal congestion, postnasal drainage, sinus pressure, and sore throat. CV:  Denies chest pain or discomfort. Resp:  Complains of cough; denies shortness of breath and wheezing.  Physical Exam  General:  Well-developed,well-nourished,in no acute distress; alert,appropriate and cooperative throughout examination VSS, nontoxic appearing Eyes:  Conjunctiva clear bilat. Ears:  R TM bulging.   Left TM dull Nose:  boggy turbinates, sinuses TTP throughout Mouth:  pharyngeal erythema.   Lungs:  Normal respiratory effort, chest expands symmetrically. Lungs are clear to auscultation, no crackles or wheezes. Heart:  Normal rate and regular rhythm. S1 and S2 normal without gallop, murmur, click, rub or other extra sounds. Extremities:  No clubbing, cyanosis, edema, or deformity noted with normal full range of motion of all joints.   Neurologic:  No cranial nerve deficits noted. Station and gait are normal. Plantar reflexes are down-going bilaterally. DTRs are symmetrical throughout. Sensory, motor and coordinative functions appear intact. Skin:  Intact without suspicious lesions or rashes but for two lesions on the back,large well healed scar on upper back (right) Psych:  Cognition and judgment appear intact. Alert and cooperative with normal attention span and concentration. No apparent delusions, illusions, hallucinations   Impression & Recommendations:  Problem # 1:  OTHER ACUTE SINUSITIS (ICD-461.8) Assessment New Given duration and progression of symptoms, will treat  for bacterial sinusitis with Augmentin.  Supportive care as per pt instructions.    His updated medication list for this problem includes:    Augmentin 875-125 Mg Tabs (Amoxicillin-pot clavulanate) .Marland Kitchen... 1 by mouth 2 times daily x 10 days  Complete Medication List: 1)  Atenolol 50 Mg Tabs (Atenolol) .... Take 1 tablet by mouth once a day 2)  Vitamin D 1000 Unit Tabs (Cholecalciferol) .... Take one by mouth two times a day 3)  Augmentin 875-125 Mg Tabs (Amoxicillin-pot clavulanate) .Marland Kitchen.. 1 by mouth 2 times daily x 10 days  Patient Instructions: 1)  Take antibiotic as directed.  Drink lots of fluids.  Treat sympotmatically with Mucinex, nasal saline irrigation, and Tylenol/Ibuprofen. Also try claritin D or zyrtec D over the counter- two times a day as needed ( have to sign for them at pharmacy). You can use warm compresses.  Cough suppressant at night. Call if not improving as expected in 5-7 days.  Prescriptions: AUGMENTIN 875-125 MG TABS (AMOXICILLIN-POT CLAVULANATE) 1 by mouth 2 times daily x 10 days  #20 x 0   Entered and Authorized by:   Ruthe Mannan MD   Signed by:   Ruthe Mannan MD on 01/16/2010   Method used:   Print then Give to Patient   RxID:   [id number]    Orders Added: 1)  Est. Patient Level III [14782]    Current Allergies (reviewed today): No known allergies  Rx for Atenolol faxed to [facility].  Linde Gillis CMA (AAMA)  January 16, 2010 10:06 AM

## 2010-02-14 NOTE — Consult Note (Signed)
Summary: Dr[facility],Note  Dr[facility],Note   Imported By: Beau Fanny 01/01/2009 11:11:22  _____________________________________________________________________  External Attachment:    Type:   Image     Comment:   External Document

## 2010-05-30 NOTE — Op Note (Signed)
NAME:  Larry Edwards, Larry Edwards             ACCOUNT NO.:  [id number]   MEDICAL RECORD NO.:  [id number]          PATIENT TYPE:  OIB   LOCATION:  2899                         FACILITY:  [facility]   PHYSICIAN:  Donalee Citrin, M.D.        DATE OF BIRTH:  Aug 08, 1965   DATE OF PROCEDURE:  05/21/2004  DATE OF DISCHARGE:                                 OPERATIVE REPORT   PREOPERATIVE DIAGNOSIS:  Right L5 radiculopathy from ruptured disk, L4-5  right.   POSTOPERATIVE DIAGNOSIS:  Right L5 radiculopathy from ruptured disk, L4-5  right.   OPERATION PERFORMED:  Lumbar laminectomy with microscopic diskectomy, L4-5  right, with microscopic dissection of the right L5 nerve root.   SURGEON:  Donalee Citrin, M.D.   ASSISTANT:  Tia Alert, MD   ANESTHESIA:  General endotracheal.   INDICATIONS FOR PROCEDURE:  The patient is a very pleasant 45 year old  gentleman who has had longstanding chronic back pain, who has had  progressive worsening back and right leg pain with posterior thigh pain  radiating down to the top of his foot and big toe as well as the outside of  his foot with numbness and tingling in the same distribution. Preoperative  imaging showed a large ruptured disk extending rightward into the right L5  nerve root as well as migrating a little bit superiorly behind the 4 body  compressing the central canal.  Due to the patient's unilateral symptoms and  failure of conservative treatment, the patient was recommended laminectomy  and microdiskectomy.  I extensively went over the risks and benefits with  him and he understands and agrees to proceed forward.   DESCRIPTION OF PROCEDURE:  The patient was brought to the operating room  where he was induced under general anesthesia, positioned prone on the  Wilson frame.  The back was prepped and draped in the usual sterile fashion.  Preoperative x-ray localized the L4-5 disk space and midline incision was  made after infiltration of 10 cc of lidocaine and  Bovie electrocautery was  used to take down the subcutaneous tissues.  Subperiosteal dissection was  carried out lamina of what was believed to be L4 and L5 on the right.  Intraoperative x-ray confirmed localization of the L5-S1 disk space and  attention was taken one interspace above this and the subperiosteal  dissection was carried out to the lamina of the level above and then the  retractor was repositioned.  Using high speed drill the medial aspect of the  facet complex, inferior aspect of the lamina and the lateral aspect of the  spinous process was drilled down.  Then using a 2 and 3 mm Kerrison punch,  the inferior aspect of the lamina of L4 was removed, medial aspect of the  facet complex, superior aspect of lamina at L5.  The ligament was noted to  be markedly hypertrophied. This was dissected off the thecal sac with a 4  Penfield and removed in piecemeal fashion.  Then the operating microscope  was draped and brought into the field.  Under microscopic illumination, the  remainder of the ligament was freed  up from the lateral gutter and the  laminectomy was extended out laterally underneath the undersurface of the  medial facet complex.  There was noted to be a large piece of inferior facet  compressing the proximal L5 nerve root.  This was all underbitten and  removed, decompressing the dorsal aspect of the L5 nerve root.  Then the L5  nerve root was dissected free from a large bulbous disk bulging fragment  that was still contained in the ligament with a 4 Penfield and using a  D'Errico nerve root retractor, this was reflected medially.  Annulotomy was  then made.  Pituitary rongeurs were used to __________ disk space.  Several  large fragments of disk were removed from underneath the ligament.  The  superior aspect of the disk space behind the 4 body.  After this was all  removed, the annulus was then markedly collapsed.  The disk space was noted  to be cleaned out, the thecal  sac was decompressed, the lateral, medial,  cephalad and caudad aspect of the thecal sac and the L5 nerve root was  explored with a coronary dilator with no evidence of further stenosis.  Then  the wound was copiously irrigated and meticulous hemostasis was maintained.  Gelfoam was overlaid on top of the dura.  The muscle and fascia were  reapproximated with 0 interrupted Vicryl and the subcutaneous tissues with 2-  0 interrupted Vicryl and the skin was closed with running 4-0 subcuticular.  Benzoin and Steri-Strips were applied.  The patient was taken to the  recovery room in stable condition.  At the end of the case all needle and  sponge counts were correct.      GC/MEDQ  D:  05/21/2004  T:  05/21/2004  Job:  [id number]

## 2010-09-22 ENCOUNTER — Other Ambulatory Visit: Payer: Self-pay

## 2010-09-25 ENCOUNTER — Other Ambulatory Visit: Payer: Self-pay | Admitting: Family Medicine

## 2010-09-25 ENCOUNTER — Encounter: Payer: Self-pay | Admitting: Family Medicine

## 2010-09-25 DIAGNOSIS — E559 Vitamin D deficiency, unspecified: Secondary | ICD-10-CM

## 2010-09-25 DIAGNOSIS — I10 Essential (primary) hypertension: Secondary | ICD-10-CM

## 2010-09-25 DIAGNOSIS — C4359 Malignant melanoma of other part of trunk: Secondary | ICD-10-CM

## 2010-09-25 DIAGNOSIS — R7309 Other abnormal glucose: Secondary | ICD-10-CM

## 2010-09-25 DIAGNOSIS — E785 Hyperlipidemia, unspecified: Secondary | ICD-10-CM

## 2010-09-29 ENCOUNTER — Other Ambulatory Visit (INDEPENDENT_AMBULATORY_CARE_PROVIDER_SITE_OTHER): Payer: BC Managed Care – PPO

## 2010-09-29 DIAGNOSIS — C4359 Malignant melanoma of other part of trunk: Secondary | ICD-10-CM

## 2010-09-29 DIAGNOSIS — R7309 Other abnormal glucose: Secondary | ICD-10-CM

## 2010-09-29 DIAGNOSIS — E785 Hyperlipidemia, unspecified: Secondary | ICD-10-CM

## 2010-09-29 DIAGNOSIS — E559 Vitamin D deficiency, unspecified: Secondary | ICD-10-CM

## 2010-09-29 LAB — LDL CHOLESTEROL, DIRECT: Direct LDL: 135.9 mg/dL

## 2010-09-29 LAB — HEPATIC FUNCTION PANEL
ALT: 31 U/L (ref 0–53)
AST: 26 U/L (ref 0–37)
Albumin: 4.4 g/dL (ref 3.5–5.2)
Alkaline Phosphatase: 41 U/L (ref 39–117)
Bilirubin, Direct: 0 mg/dL (ref 0.0–0.3)
Total Bilirubin: 0.2 mg/dL — ABNORMAL LOW (ref 0.3–1.2)
Total Protein: 7.2 g/dL (ref 6.0–8.3)

## 2010-09-29 LAB — CBC WITH DIFFERENTIAL/PLATELET
Basophils Absolute: 0 10*3/uL (ref 0.0–0.1)
Basophils Relative: 0.5 % (ref 0.0–3.0)
Eosinophils Absolute: 0.2 10*3/uL (ref 0.0–0.7)
Eosinophils Relative: 3.3 % (ref 0.0–5.0)
HCT: 44.1 % (ref 39.0–52.0)
Hemoglobin: 14.7 g/dL (ref 13.0–17.0)
Lymphocytes Relative: 33 % (ref 12.0–46.0)
Lymphs Abs: 2.4 10*3/uL (ref 0.7–4.0)
MCHC: 33.3 g/dL (ref 30.0–36.0)
MCV: 89.9 fl (ref 78.0–100.0)
Monocytes Absolute: 0.6 10*3/uL (ref 0.1–1.0)
Monocytes Relative: 8.2 % (ref 3.0–12.0)
Neutro Abs: 3.9 10*3/uL (ref 1.4–7.7)
Neutrophils Relative %: 55 % (ref 43.0–77.0)
Platelets: 194 10*3/uL (ref 150.0–400.0)
RBC: 4.9 Mil/uL (ref 4.22–5.81)
RDW: 13.9 % (ref 11.5–14.6)
WBC: 7.2 10*3/uL (ref 4.5–10.5)

## 2010-09-29 LAB — RENAL FUNCTION PANEL
Albumin: 4.4 g/dL (ref 3.5–5.2)
BUN: 19 mg/dL (ref 6–23)
CO2: 27 mEq/L (ref 19–32)
Calcium: 9.3 mg/dL (ref 8.4–10.5)
Chloride: 105 mEq/L (ref 96–112)
Creatinine, Ser: 1 mg/dL (ref 0.4–1.5)
GFR: 82.28 mL/min (ref >60.00)
Glucose, Bld: 120 mg/dL — ABNORMAL HIGH (ref 70–99)
Phosphorus: 3.1 mg/dL (ref 2.3–4.6)
Potassium: 4.7 mEq/L (ref 3.5–5.1)
Sodium: 139 mEq/L (ref 135–145)

## 2010-09-29 LAB — MICROALBUMIN / CREATININE URINE RATIO
Creatinine,U: 175.5 mg/dL
Microalb Creat Ratio: 0.5 mg/g (ref 0.0–30.0)
Microalb, Ur: 0.8 mg/dL (ref 0.0–1.9)

## 2010-09-29 LAB — LIPID PANEL
Cholesterol: 236 mg/dL — ABNORMAL HIGH (ref 0–200)
HDL: 39.7 mg/dL (ref >39.00)
Total CHOL/HDL Ratio: 6
Triglycerides: 282 mg/dL — ABNORMAL HIGH (ref 0.0–149.0)
VLDL: 56.4 mg/dL — ABNORMAL HIGH (ref 0.0–40.0)

## 2010-09-29 LAB — TSH: TSH: 0.51 u[IU]/mL (ref 0.35–5.50)

## 2010-09-30 LAB — VITAMIN D 25 HYDROXY (VIT D DEFICIENCY, FRACTURES): Vit D, 25-Hydroxy: 47 ng/mL (ref 30–89)

## 2010-10-01 ENCOUNTER — Encounter: Payer: Self-pay | Admitting: Family Medicine

## 2010-10-02 ENCOUNTER — Encounter: Payer: Self-pay | Admitting: Family Medicine

## 2010-10-02 ENCOUNTER — Ambulatory Visit (INDEPENDENT_AMBULATORY_CARE_PROVIDER_SITE_OTHER): Payer: BC Managed Care – PPO | Admitting: Family Medicine

## 2010-10-02 VITALS — BP 130/64 | HR 76 | Temp 98.6°F | Ht 70.0 in | Wt 265.2 lb

## 2010-10-02 DIAGNOSIS — E785 Hyperlipidemia, unspecified: Secondary | ICD-10-CM

## 2010-10-02 DIAGNOSIS — E559 Vitamin D deficiency, unspecified: Secondary | ICD-10-CM

## 2010-10-02 DIAGNOSIS — I10 Essential (primary) hypertension: Secondary | ICD-10-CM

## 2010-10-02 DIAGNOSIS — Z0000 Encounter for general adult medical examination without abnormal findings: Secondary | ICD-10-CM

## 2010-10-02 DIAGNOSIS — F17200 Nicotine dependence, unspecified, uncomplicated: Secondary | ICD-10-CM

## 2010-10-02 DIAGNOSIS — R7309 Other abnormal glucose: Secondary | ICD-10-CM

## 2010-10-02 LAB — HEMOGLOBIN A1C: Hgb A1c MFr Bld: 6.3 % (ref 4.6–6.5)

## 2010-10-02 NOTE — Patient Instructions (Signed)
Good to see you. Triglycerides are too high.  Weight loss (even small amount) can decrease triglycerides.  Decrease added sugars, eliminate trans fats, increase fiber and limit alcohol.  All these changes together can drop triglycerides by almost 50%. Follow eat right diet.

## 2010-10-02 NOTE — Progress Notes (Signed)
45 yo here for CPX.  Hyperglycemia- Fasting CBG continues to increase.   120 this month.  Denies increased thirst or urination. Admits to liking starches and sweet drinks.  He cannot remember if he was actually fasting that morning, thinks he may have had some mountain dew. Has lost weight since earlier this year, trying to walk more and cut back on fried foods.   Wt Readings from Last 3 Encounters:  10/02/10 265 lb 4 oz (120.317 kg)  01/16/10 276 lb 12 oz (125.533 kg)  09/19/09 270 lb (122.471 kg)   Tobacco abuse- smokes 15 cigs per day, not ready to quit again.   Vitamin D deficiency- records reviewed, at one point Vit D was 12, most recently 66. Feels much less tired. Still taking 1000 International Units two times a day.   HLD- Lab Results  Component Value Date   CHOL 236* 09/29/2010   CHOL 204* 09/19/2009   CHOL 204* 09/05/2008   Lab Results  Component Value Date   HDL 39.70 09/29/2010   HDL 16.10* 09/19/2009   HDL 96.04* 09/05/2008   Lab Results  Component Value Date   LDLCALC 130* 08/31/2006   Lab Results  Component Value Date   TRIG 282.0* 09/29/2010   TRIG 219.0* 09/19/2009   TRIG 221.0* 09/05/2008    Melanoma insitu- s/p wide resection. Doing well.   Patient Active Problem List  Diagnoses  . MALIGNANT MELANOMA IN SITU OF BACK  . VITAMIN D DEFICIENCY  . HYPERLIPIDEMIA  . TOBACCO ABUSE  . HYPERTENSION  . HAY FEVER  . GERD  . GASTRITIS, CHRONIC  . IRRITABLE BOWEL SYNDROME  . HYPERGLYCEMIA  . OTHER ACUTE SINUSITIS   Past Medical History  Diagnosis Date  . GERD (gastroesophageal reflux disease)   . Hyperlipidemia   . Hypertension    Past Surgical History  Procedure Date  . Tonsillectomy and adenoidectomy     age 11  . Cholecystectomy 01/1995  . Lumbar disc surgery 05/2004   History  Substance Use Topics  . Smoking status: Not on file  . Smokeless tobacco: Not on file  . Alcohol Use:    Family History  Problem Relation Age of Onset  . Cancer  Mother     ovarian  . Diabetes Mother   . Diabetes Father    Allergies not on file Current Outpatient Prescriptions on File Prior to Visit  Medication Sig Dispense Refill  . atenolol (TENORMIN) 50 MG tablet Take 50 mg by mouth daily.        . cholecalciferol (VITAMIN D) 1000 UNITS tablet Take 1,000 Units by mouth 2 (two) times daily.         The PMH, PSH, Social History, Family History, Medications, and allergies have been reviewed in [facility], and have been updated if relevant.   ROS: Patient reports no  vision/ hearing changes,anorexia, weight change, fever ,adenopathy, persistant / recurrent hoarseness, swallowing issues, chest pain, edema,persistant / recurrent cough, hemoptysis, dyspnea(rest, exertional, paroxysmal nocturnal), gastrointestinal  bleeding (melena, rectal bleeding), abdominal pain, excessive heart burn, GU symptoms(dysuria, hematuria, pyuria, voiding/incontinence  Issues) syncope, focal weakness, severe memory loss, concerning skin lesions, depression, anxiety, abnormal bruising/bleeding, major joint swelling.     Physical Exam  BP 130/64  Pulse 76  Temp(Src) 98.6 F (37 C) (Oral)  Ht 5\' 10"  (1.778 m)  Wt 265 lb 4 oz (120.317 kg)  BMI 38.06 kg/m2  General:  overweght male in NAD Eyes:  PERRL Ears:  External ear exam shows no significant  lesions or deformities.  Otoscopic examination reveals clear canals, tympanic membranes are intact bilaterally without bulging, retraction, inflammation or discharge. Hearing is grossly normal bilaterally. Nose:  External nasal examination shows no deformity or inflammation. Nasal mucosa are pink and moist without lesions or exudates. Mouth:  Oral mucosa and oropharynx without lesions or exudates.  Teeth in good repair. Neck:  no carotid bruit or thyromegaly no cervical or supraclavicular lymphadenopathy  Lungs:  Normal respiratory effort, chest expands symmetrically. Lungs are clear to auscultation, no crackles or wheezes. Heart:   Normal rate and regular rhythm. S1 and S2 normal without gallop, murmur, click, rub or other extra sounds. Abdomen:  Bowel sounds positive,abdomen soft and non-tender without masses, organomegaly or hernias noted. Genitalia:  Testes bilaterally descended without nodularity, tenderness or masses. No scrotal masses or lesions. No penis lesions or urethral discharge. Prostate:  Prostate gland firm and smooth, 1 plus enlargement, no nodularity, tenderness, mass, asymmetry or induration. Pulses:  R and L posterior tibial pulses are full and equal bilaterally  Extremities:  no edema   Assessment and Plan: 1. Routine general medical examination at a health care facility   Reviewed preventive care protocols, scheduled due services, and updated immunizations Discussed nutrition, exercise, diet, and healthy lifestyle.    2. HYPERGLYCEMIA   Deteriorated.   Check a1c today since pt not sure if he was fasting. Eat right handout given and discussed. See pt instructions for details.  HgB A1c  3. HYPERLIPIDEMIA   Elevated TG. See above.   4. HYPERTENSION   Stable.    5. VITAMIN D DEFICIENCY   Resolved.  Doing well.   6. TOBACCO ABUSE   Unchanged.  Counseling given.

## 2010-12-25 ENCOUNTER — Other Ambulatory Visit: Payer: Self-pay | Admitting: Family Medicine

## 2010-12-25 DIAGNOSIS — R7309 Other abnormal glucose: Secondary | ICD-10-CM

## 2010-12-30 ENCOUNTER — Other Ambulatory Visit (INDEPENDENT_AMBULATORY_CARE_PROVIDER_SITE_OTHER): Payer: BC Managed Care – PPO

## 2010-12-30 DIAGNOSIS — R7309 Other abnormal glucose: Secondary | ICD-10-CM

## 2010-12-30 LAB — HEMOGLOBIN A1C: Hgb A1c MFr Bld: 6.1 % (ref 4.6–6.5)

## 2011-02-05 ENCOUNTER — Other Ambulatory Visit: Payer: Self-pay

## 2011-02-05 NOTE — Telephone Encounter (Signed)
Pt said needs new rx from Dr Dayton Mart[facility] 50 mg taking 1 daily. Please call pt when refill done at [phone]. Pt said if problem or need new form to call him since [facility].

## 2011-02-06 MED ORDER — ATENOLOL 50 MG PO TABS: 50.0000 mg | ORAL_TABLET | Freq: Every day | ORAL | Status: DC

## 2011-02-06 NOTE — Telephone Encounter (Signed)
Rx sent to [facility] #[id number] with 3 refills, patient advised via message left on cell phone voicemail.

## 2011-03-04 ENCOUNTER — Ambulatory Visit (INDEPENDENT_AMBULATORY_CARE_PROVIDER_SITE_OTHER): Payer: BC Managed Care – PPO | Admitting: Family Medicine

## 2011-03-04 ENCOUNTER — Encounter: Payer: Self-pay | Admitting: Family Medicine

## 2011-03-04 VITALS — BP 122/78 | HR 68 | Temp 98.8°F | Wt 265.8 lb

## 2011-03-04 DIAGNOSIS — J018 Other acute sinusitis: Secondary | ICD-10-CM

## 2011-03-04 DIAGNOSIS — J029 Acute pharyngitis, unspecified: Secondary | ICD-10-CM

## 2011-03-04 LAB — POCT RAPID STREP A (OFFICE): Rapid Strep A Screen: NEGATIVE

## 2011-03-04 MED ORDER — FLUTICASONE PROPIONATE 50 MCG/ACT NA SUSP: 2.0000 | Freq: Every day | NASAL | Status: DC

## 2011-03-04 MED ORDER — AMOXICILLIN-POT CLAVULANATE 875-125 MG PO TABS: 1.0000 | ORAL_TABLET | Freq: Two times a day (BID) | ORAL | Status: AC

## 2011-03-04 NOTE — Assessment & Plan Note (Signed)
Anticipate developing sinusitis. Discussed likely viral but given duration and progression, provided with WASP script and dicsussed reasons to fill. See pt instructions for supportive care.

## 2011-03-04 NOTE — Progress Notes (Signed)
  Subjective:    Patient ID: Larry Edwards, male    DOB: Jan 23, 1965, 46 y.o.   MRN: [id number]  HPI CC: ST  9d h/o sinus drainage, ST.  Glands feel swollen as well.  Feeling some ear ache R>L.  Concerned moving into chest.  Some abd upset.  +PNdrainage. Initially getting better, but today feeling worse he has felt.  Has tried tylenol and guaifenesin.  No fevers/chills, minimal cough, no rashes, HA, tooth pain.  No smokers at home.  No sick contacts at home.  No h/o asthma, COPD.  Quit smoking 1 wk ago!  Review of Systems Per HPI    Objective:   Physical Exam  Nursing note and vitals reviewed. Constitutional: He appears well-developed and well-nourished. No distress.  HENT:  Head: Normocephalic and atraumatic.  Right Ear: Hearing, tympanic membrane, external ear and ear canal normal.  Left Ear: Hearing, tympanic membrane, external ear and ear canal normal.  Nose: Nose normal. No mucosal edema or rhinorrhea. Right sinus exhibits no maxillary sinus tenderness and no frontal sinus tenderness. Left sinus exhibits no maxillary sinus tenderness and no frontal sinus tenderness.  Mouth/Throat: Uvula is midline and mucous membranes are normal. Posterior oropharyngeal edema and posterior oropharyngeal erythema present. No oropharyngeal exudate or tonsillar abscesses.       + PNDrainage  Eyes: Conjunctivae and EOM are normal. Pupils are equal, round, and reactive to light. No scleral icterus.  Neck: Normal range of motion. Neck supple.  Cardiovascular: Normal rate, regular rhythm, normal heart sounds and intact distal pulses.   No murmur heard. Pulmonary/Chest: Effort normal and breath sounds normal. No respiratory distress. He has no wheezes. He has no rales.  Lymphadenopathy:    He has no cervical adenopathy.  Skin: Skin is warm and dry. No rash noted.       Assessment & Plan:

## 2011-03-04 NOTE — Patient Instructions (Signed)
You have a sinus infection, likely viral. Take medicine as prescribed: flonase for next week.  If not better, start augmentin Push fluids and plenty of rest. Nasal saline irrigation or neti pot to help drain sinuses. May use simple mucinex with plenty of fluid to help mobilize mucous. Let us know if fever >101.5, trouble opening/closing mouth, difficulty swallowing, or worsening - you may need to be seen again.

## 2011-09-23 ENCOUNTER — Other Ambulatory Visit: Payer: Self-pay | Admitting: Family Medicine

## 2011-09-23 DIAGNOSIS — Z0000 Encounter for general adult medical examination without abnormal findings: Secondary | ICD-10-CM

## 2011-09-23 DIAGNOSIS — E785 Hyperlipidemia, unspecified: Secondary | ICD-10-CM

## 2011-09-30 ENCOUNTER — Other Ambulatory Visit (INDEPENDENT_AMBULATORY_CARE_PROVIDER_SITE_OTHER): Payer: BC Managed Care – PPO

## 2011-09-30 DIAGNOSIS — Z0000 Encounter for general adult medical examination without abnormal findings: Secondary | ICD-10-CM

## 2011-09-30 DIAGNOSIS — E785 Hyperlipidemia, unspecified: Secondary | ICD-10-CM

## 2011-09-30 LAB — LIPID PANEL
Cholesterol: 205 mg/dL — ABNORMAL HIGH (ref 0–200)
HDL: 29.7 mg/dL — ABNORMAL LOW (ref >39.00)
Total CHOL/HDL Ratio: 7
Triglycerides: 297 mg/dL — ABNORMAL HIGH (ref 0.0–149.0)
VLDL: 59.4 mg/dL — ABNORMAL HIGH (ref 0.0–40.0)

## 2011-09-30 LAB — COMPREHENSIVE METABOLIC PANEL
ALT: 38 U/L (ref 0–53)
AST: 27 U/L (ref 0–37)
Albumin: 4.1 g/dL (ref 3.5–5.2)
Alkaline Phosphatase: 33 U/L — ABNORMAL LOW (ref 39–117)
BUN: 18 mg/dL (ref 6–23)
CO2: 30 mEq/L (ref 19–32)
Calcium: 9 mg/dL (ref 8.4–10.5)
Chloride: 105 mEq/L (ref 96–112)
Creatinine, Ser: 1.1 mg/dL (ref 0.4–1.5)
GFR: 80.13 mL/min (ref >60.00)
Glucose, Bld: 111 mg/dL — ABNORMAL HIGH (ref 70–99)
Potassium: 4.5 mEq/L (ref 3.5–5.1)
Sodium: 140 mEq/L (ref 135–145)
Total Bilirubin: 0.5 mg/dL (ref 0.3–1.2)
Total Protein: 6.7 g/dL (ref 6.0–8.3)

## 2011-09-30 LAB — LDL CHOLESTEROL, DIRECT: Direct LDL: 117 mg/dL

## 2011-10-05 ENCOUNTER — Encounter: Payer: BC Managed Care – PPO | Admitting: Family Medicine

## 2011-10-06 ENCOUNTER — Ambulatory Visit (INDEPENDENT_AMBULATORY_CARE_PROVIDER_SITE_OTHER): Payer: BC Managed Care – PPO | Admitting: Family Medicine

## 2011-10-06 ENCOUNTER — Encounter: Payer: Self-pay | Admitting: Family Medicine

## 2011-10-06 VITALS — BP 132/80 | HR 68 | Temp 98.0°F | Ht 69.5 in | Wt 274.0 lb

## 2011-10-06 DIAGNOSIS — F17200 Nicotine dependence, unspecified, uncomplicated: Secondary | ICD-10-CM

## 2011-10-06 DIAGNOSIS — R7309 Other abnormal glucose: Secondary | ICD-10-CM

## 2011-10-06 DIAGNOSIS — R739 Hyperglycemia, unspecified: Secondary | ICD-10-CM

## 2011-10-06 DIAGNOSIS — E785 Hyperlipidemia, unspecified: Secondary | ICD-10-CM

## 2011-10-06 DIAGNOSIS — I10 Essential (primary) hypertension: Secondary | ICD-10-CM

## 2011-10-06 MED ORDER — METFORMIN HCL 500 MG PO TABS: 500.0000 mg | ORAL_TABLET | Freq: Every day | ORAL | Status: DC

## 2011-10-06 NOTE — Progress Notes (Signed)
46 yo here for CPX.  Quit smoking 7 months ago- cold [place]. Has gained 9 pounds since he quit smoking.  Hyperglycemia- Fasting CBG still elevated but improved. TG elevated, HDL low.  Lab Results  Component Value Date   CHOL 205* 09/30/2011   HDL 29.70* 09/30/2011   LDLCALC 130* 08/31/2006   LDLDIRECT 117.0 09/30/2011   TRIG 297.0* 09/30/2011   CHOLHDL 7 09/30/2011      Denies increased thirst or urination. Admits to liking starches and sweet drinks.  He cannot remember if he was actually fasting that morning, thinks he may have had some mountain dew. Has lost weight since earlier this year, trying to walk more and cut back on fried foods.  Does have strong FH of DM.  Wt Readings from Last 3 Encounters:  10/06/11 274 lb (124.286 kg)  03/04/11 265 lb 12 oz (120.543 kg)  10/02/10 265 lb 4 oz (120.317 kg)       Patient Active Problem List  Diagnosis  . MALIGNANT MELANOMA IN SITU OF BACK  . VITAMIN D DEFICIENCY  . HYPERLIPIDEMIA  . TOBACCO ABUSE  . HYPERTENSION  . HAY FEVER  . GERD  . GASTRITIS, CHRONIC  . IRRITABLE BOWEL SYNDROME  . HYPERGLYCEMIA  . Other acute sinusitis  . Routine general medical examination at a health care facility   Past Medical History  Diagnosis Date  . GERD (gastroesophageal reflux disease)   . Hyperlipidemia   . Hypertension    Past Surgical History  Procedure Date  . Tonsillectomy and adenoidectomy     age 42  . Cholecystectomy 01/1995  . Lumbar disc surgery 05/2004   History  Substance Use Topics  . Smoking status: Former Smoker    Quit date: 02/25/2011  . Smokeless tobacco: Not on file  . Alcohol Use: Yes     rare   Family History  Problem Relation Age of Onset  . Cancer Mother     ovarian  . Diabetes Mother   . Diabetes Father    No Known Allergies Current Outpatient Prescriptions on File Prior to Visit  Medication Sig Dispense Refill  . atenolol (TENORMIN) 50 MG tablet Take 1 tablet (50 mg total) by mouth daily.  90  tablet  3  . cholecalciferol (VITAMIN D) 1000 UNITS tablet Take 1,000 Units by mouth 2 (two) times daily.        . fish oil-omega-3 fatty acids 1000 MG capsule Take 2 g by mouth 2 (two) times daily.       The PMH, PSH, Social History, Family History, Medications, and allergies have been reviewed in [facility], and have been updated if relevant.   ROS: Patient reports no  vision/ hearing changes,anorexia, weight change, fever ,adenopathy, persistant / recurrent hoarseness, swallowing issues, chest pain, edema,persistant / recurrent cough, hemoptysis, dyspnea(rest, exertional, paroxysmal nocturnal), gastrointestinal  bleeding (melena, rectal bleeding), abdominal pain, excessive heart burn, GU symptoms(dysuria, hematuria, pyuria, voiding/incontinence  Issues) syncope, focal weakness, severe memory loss, concerning skin lesions, depression, anxiety, abnormal bruising/bleeding, major joint swelling.     Physical Exam  BP 132/80  Pulse 68  Temp 98 F (36.7 C)  Ht 5' 9.5" (1.765 m)  Wt 274 lb (124.286 kg)  BMI 39.88 kg/m2 Wt Readings from Last 3 Encounters:  10/06/11 274 lb (124.286 kg)  03/04/11 265 lb 12 oz (120.543 kg)  10/02/10 265 lb 4 oz (120.317 kg)     General:  overweght male in NAD Eyes:  PERRL Ears:  External ear exam shows  no significant lesions or deformities.  Otoscopic examination reveals clear canals, tympanic membranes are intact bilaterally without bulging, retraction, inflammation or discharge. Hearing is grossly normal bilaterally. Nose:  External nasal examination shows no deformity or inflammation. Nasal mucosa are pink and moist without lesions or exudates. Mouth:  Oral mucosa and oropharynx without lesions or exudates.  Teeth in good repair. Neck:  no carotid bruit or thyromegaly no cervical or supraclavicular lymphadenopathy  Lungs:  Normal respiratory effort, chest expands symmetrically. Lungs are clear to auscultation, no crackles or wheezes. Heart:  Normal rate and  regular rhythm. S1 and S2 normal without gallop, murmur, click, rub or other extra sounds. Abdomen:  Bowel sounds positive,abdomen soft and non-tender without masses, organomegaly or hernias noted. Genitalia:  Testes bilaterally descended without nodularity, tenderness or masses. No scrotal masses or lesions. No penis lesions or urethral discharge. Prostate:  Prostate gland firm and smooth, 1 plus enlargement, no nodularity, tenderness, mass, asymmetry or induration. Pulses:  R and L posterior tibial pulses are full and equal bilaterally  Extremities:  no edema   Assessment and Plan: 1. Routine general medical examination at a health care facility   Reviewed preventive care protocols, scheduled due services, and updated immunizations Discussed nutrition, exercise, diet, and healthy lifestyle.    2. HYPERGLYCEMIA   Given low HDL and high TG and strong FH of DM, will start Meformin 500 mg daily. Follow up labs in 3 months. The patient indicates understanding of these issues and agrees with the plan.     3. HYPERLIPIDEMIA   Elevated TG. See above.   4. HYPERTENSION   Stable.

## 2011-10-06 NOTE — Patient Instructions (Addendum)
We are adding Metformin 500 mg daily. Please come in 3 months to recheck your labs- cholesterol, CMET, a1c.  Try Debrox drops over the counter.  Start exercising!!!

## 2011-10-09 ENCOUNTER — Ambulatory Visit (INDEPENDENT_AMBULATORY_CARE_PROVIDER_SITE_OTHER): Payer: BC Managed Care – PPO | Admitting: Family Medicine

## 2011-10-09 ENCOUNTER — Encounter: Payer: Self-pay | Admitting: Family Medicine

## 2011-10-09 VITALS — BP 120/70 | HR 88 | Temp 98.1°F | Ht 69.5 in | Wt 271.5 lb

## 2011-10-09 DIAGNOSIS — H109 Unspecified conjunctivitis: Secondary | ICD-10-CM

## 2011-10-09 MED ORDER — POLYMYXIN B-TRIMETHOPRIM 10000-0.1 UNIT/ML-% OP SOLN: 1.0000 [drp] | OPHTHALMIC | Status: AC

## 2011-10-09 NOTE — Patient Instructions (Addendum)
Bacterial Conjunctivitis Conjunctivitis is an irritation (inflammation) of the clear membrane that covers the white part of the eye (conjunctiva). The irritation can also happen on the underside of the eyelids. Conjunctivitis makes the eye red or pink in color. This is what is commonly known as pink eye. CAUSES   Infection from a germ (bacteria) on the surface of the eye.   Infection from the irritation or injury of nearby tissues such as the eyelids or cornea.   More serious inflammation or infection on the inside of the eye.   Other eye diseases.   The use of certain eye medications.  SYMPTOMS  The normally white color of the eye or the underside of the eyelid is usually pink or red in color. The pink eye is usually associated with irritation, tearing and some sensitivity to light. Bacterial conjunctivitis is often associated with a thick, yellowish discharge from the eye. If a discharge is present, there may also be some blurred vision in the affected eye. DIAGNOSIS  Conjunctivitis is diagnosed by an eye exam. The eye specialist looks for changes in the surface tissues of the eye which take on changes that point to the specific type of conjunctivitis. A sample of any discharge may be collected on a Q-Tip (sterile swap). The sample will be sent to a lab to see whether or not the inflammation is caused by bacterial or viral infection. TREATMENT  Bacterial conjunctivitis is treated with medicines that kill germs (antibiotics). Drops are most often used. However, antibiotic ointments are available and may be preferred by some patients. Antibiotics by mouth (oral) are sometimes used. Artificial tears or eye washes may ease discomfort. HOME CARE INSTRUCTIONS   To ease discomfort, apply a cool, clean wash cloth to the eye for 10 to 20 minutes, 3 to 4 times a day.   Gently wipe away any drainage from the eye with a warm, wet washcloth or a cotton ball.   Wash your hands often with soap. Use paper  towels to dry.   Do not share towels or wash cloths. This may spread the infection.   Change or wash your pillow case every day.   You should not use eye make-up until the infection is gone.   Do not operate machinery or drive if vision is blurred.   Stop using contacts lenses. Ask your eye professional how to sterilize or replace them before using again. This depends on the type of contact lenses used.   Do not touch the edge of the eyelid with the eye drop bottle or ointment tube when applying medications to the affected eye. This will stop you from spreading the infection to the other eye or to others. Do as your caregiver tell you.  SEEK IMMEDIATE MEDICAL CARE IF:   The infection has not improved within 3 days of beginning treatment.   A yellow discharge from the eye develops.   Pain in the eye increases.   The redness is spreading.   Vision becomes blurred.   An oral temperature above 102 F (38.9 C) develops, or as your caregiver suggests.   Facial pain, redness or swelling develops.   Any problems that may be related to the prescribed medicine develops.  MAKE SURE YOU:   Understand these instructions.   Will watch your condition.   Will get help right away if you are not doing well or get worse.  Document Released: 12/29/2004 Document Revised: 12/18/2010 Document Reviewed: 08/18/2007 [employer] Patient Information 2012 [employer]. 

## 2011-10-09 NOTE — Progress Notes (Signed)
SUBJECTIVE:  46 y.o. male with burning, redness, discharge and mattering in right eye for 2 days.  No other symptoms.  No significant prior ophthalmological history. No change in visual acuity, no photophobia, no severe eye pain.  Patient Active Problem List  Diagnosis  . MALIGNANT MELANOMA IN SITU OF BACK  . VITAMIN D DEFICIENCY  . HYPERLIPIDEMIA  . TOBACCO ABUSE  . HYPERTENSION  . HAY FEVER  . GERD  . GASTRITIS, CHRONIC  . IRRITABLE BOWEL SYNDROME  . HYPERGLYCEMIA  . Other acute sinusitis  . Routine general medical examination at a health care facility   Past Medical History  Diagnosis Date  . GERD (gastroesophageal reflux disease)   . Hyperlipidemia   . Hypertension    Past Surgical History  Procedure Date  . Tonsillectomy and adenoidectomy     age 12  . Cholecystectomy 01/1995  . Lumbar disc surgery 05/2004   History  Substance Use Topics  . Smoking status: Former Smoker    Quit date: 02/25/2011  . Smokeless tobacco: Not on file  . Alcohol Use: Yes     rare   Family History  Problem Relation Age of Onset  . Cancer Mother     ovarian  . Diabetes Mother   . Diabetes Father    No Known Allergies Current Outpatient Prescriptions on File Prior to Visit  Medication Sig Dispense Refill  . atenolol (TENORMIN) 50 MG tablet Take 1 tablet (50 mg total) by mouth daily.  90 tablet  3  . cholecalciferol (VITAMIN D) 1000 UNITS tablet Take 1,000 Units by mouth 2 (two) times daily.        . fish oil-omega-3 fatty acids 1000 MG capsule Take 2 g by mouth 2 (two) times daily.      . metFORMIN (GLUCOPHAGE) 500 MG tablet Take 1 tablet (500 mg total) by mouth daily with breakfast.  30 tablet  3   The PMH, PSH, Social History, Family History, Medications, and allergies have been reviewed in [facility], and have been updated if relevant.  OBJECTIVE:  BP 120/70  Pulse 88  Temp 98.1 F (36.7 C) (Oral)  Ht 5' 9.5" (1.765 m)  Wt 271 lb 8 oz (123.152 kg)  BMI 39.52 kg/m2  Patient  appears well, vitals signs are normal. Eyes: both eyes, right > left with findings of typical conjunctivitis noted; erythema and discharge. PERRLA, no foreign body noted. No periorbital cellulitis. The corneas are clear and fundi normal. Visual acuity normal.   ASSESSMENT:  Conjunctivitis - probably bacterial  PLAN:  Antibiotic drops per order. Hygiene discussed. If other family members develop same condition, may use same medication for them if they are not known to be allergic to it. Call prn.

## 2011-10-19 ENCOUNTER — Encounter: Payer: Self-pay | Admitting: Family Medicine

## 2011-10-20 ENCOUNTER — Other Ambulatory Visit: Payer: Self-pay | Admitting: Family Medicine

## 2011-10-20 MED ORDER — METFORMIN HCL 500 MG PO TABS: 500.0000 mg | ORAL_TABLET | Freq: Every day | ORAL | Status: DC

## 2011-10-20 NOTE — Telephone Encounter (Signed)
Script sent to [facility].

## 2011-12-02 ENCOUNTER — Encounter: Payer: Self-pay | Admitting: Family Medicine

## 2011-12-02 ENCOUNTER — Ambulatory Visit (INDEPENDENT_AMBULATORY_CARE_PROVIDER_SITE_OTHER): Payer: BC Managed Care – PPO | Admitting: Family Medicine

## 2011-12-02 VITALS — BP 138/80 | HR 68 | Temp 98.3°F | Wt 267.0 lb

## 2011-12-02 DIAGNOSIS — M25522 Pain in left elbow: Secondary | ICD-10-CM

## 2011-12-02 DIAGNOSIS — M25529 Pain in unspecified elbow: Secondary | ICD-10-CM

## 2011-12-02 MED ORDER — MELOXICAM 15 MG PO TABS: 15.0000 mg | ORAL_TABLET | Freq: Every day | ORAL | Status: DC

## 2011-12-02 NOTE — Patient Instructions (Addendum)
Good to see you. Do exercises in hand out.   Meloxicam 15 mg daily with food for pain.  Call me with an update in the next few weeks.

## 2011-12-02 NOTE — Progress Notes (Signed)
SUBJECTIVE: Larry Edwards is a 46 y.o. male who complains of left elbow pain x 1 month.  No known injury but has been exercising more- lost 10 pounds since last OV!  Symptoms have been chronic since that time. Prior history of related problems: no prior problems with this area in the past.  Patient Active Problem List  Diagnosis  . MALIGNANT MELANOMA IN SITU OF BACK  . VITAMIN D DEFICIENCY  . HYPERLIPIDEMIA  . TOBACCO ABUSE  . HYPERTENSION  . HAY FEVER  . GERD  . GASTRITIS, CHRONIC  . IRRITABLE BOWEL SYNDROME  . HYPERGLYCEMIA  . Other acute sinusitis  . Routine general medical examination at a health care facility  . Elbow pain, left   Past Medical History  Diagnosis Date  . GERD (gastroesophageal reflux disease)   . Hyperlipidemia   . Hypertension    Past Surgical History  Procedure Date  . Tonsillectomy and adenoidectomy     age 28  . Cholecystectomy 01/1995  . Lumbar disc surgery 05/2004   History  Substance Use Topics  . Smoking status: Former Smoker    Quit date: 02/25/2011  . Smokeless tobacco: Not on file  . Alcohol Use: Yes     Comment: rare   Family History  Problem Relation Age of Onset  . Cancer Mother     ovarian  . Diabetes Mother   . Diabetes Father    No Known Allergies Current Outpatient Prescriptions on File Prior to Visit  Medication Sig Dispense Refill  . atenolol (TENORMIN) 50 MG tablet Take 1 tablet (50 mg total) by mouth daily.  90 tablet  3  . cholecalciferol (VITAMIN D) 1000 UNITS tablet Take 1,000 Units by mouth 2 (two) times daily.        . fish oil-omega-3 fatty acids 1000 MG capsule Take 2 g by mouth 2 (two) times daily.      . metFORMIN (GLUCOPHAGE) 500 MG tablet Take 1 tablet (500 mg total) by mouth daily with breakfast.  90 tablet  3   The PMH, PSH, Social History, Family History, Medications, and allergies have been reviewed in [facility], and have been updated if relevant.  OBJECTIVE: BP 138/80  Pulse 68  Temp 98.3 F (36.8 C)   Wt 267 lb (121.11 kg)  Appearance: alert, well appearing, and in no distress. Elbow exam: lateral epicondylar tenderness, FROM X-ray: not indicated.  ASSESSMENT: Tennis elbow  PLAN: rest the injured area as much as practical, apply ice packs, elbow strap, exercises given from sports med advisor. Rx for meloxicam sent in as well. See orders for this visit as documented in the electronic medical record.

## 2011-12-03 DIAGNOSIS — Z86018 Personal history of other benign neoplasm: Secondary | ICD-10-CM

## 2011-12-03 HISTORY — DX: Personal history of other benign neoplasm: Z86.018

## 2012-01-01 ENCOUNTER — Other Ambulatory Visit (INDEPENDENT_AMBULATORY_CARE_PROVIDER_SITE_OTHER): Payer: BC Managed Care – PPO

## 2012-01-01 DIAGNOSIS — R7309 Other abnormal glucose: Secondary | ICD-10-CM

## 2012-01-01 DIAGNOSIS — E785 Hyperlipidemia, unspecified: Secondary | ICD-10-CM

## 2012-01-01 DIAGNOSIS — R739 Hyperglycemia, unspecified: Secondary | ICD-10-CM

## 2012-01-01 LAB — COMPREHENSIVE METABOLIC PANEL
ALT: 29 U/L (ref 0–53)
AST: 22 U/L (ref 0–37)
Albumin: 4.3 g/dL (ref 3.5–5.2)
Alkaline Phosphatase: 38 U/L — ABNORMAL LOW (ref 39–117)
BUN: 22 mg/dL (ref 6–23)
CO2: 27 mEq/L (ref 19–32)
Calcium: 9.2 mg/dL (ref 8.4–10.5)
Chloride: 104 mEq/L (ref 96–112)
Creatinine, Ser: 1.2 mg/dL (ref 0.4–1.5)
GFR: 72.85 mL/min (ref >60.00)
Glucose, Bld: 107 mg/dL — ABNORMAL HIGH (ref 70–99)
Potassium: 4.4 mEq/L (ref 3.5–5.1)
Sodium: 138 mEq/L (ref 135–145)
Total Bilirubin: 0.4 mg/dL (ref 0.3–1.2)
Total Protein: 6.7 g/dL (ref 6.0–8.3)

## 2012-01-01 LAB — LIPID PANEL
Cholesterol: 191 mg/dL (ref 0–200)
HDL: 30.7 mg/dL — ABNORMAL LOW (ref >39.00)
Total CHOL/HDL Ratio: 6
Triglycerides: 285 mg/dL — ABNORMAL HIGH (ref 0.0–149.0)
VLDL: 57 mg/dL — ABNORMAL HIGH (ref 0.0–40.0)

## 2012-01-01 LAB — LDL CHOLESTEROL, DIRECT: Direct LDL: 105.6 mg/dL

## 2012-01-01 LAB — HEMOGLOBIN A1C: Hgb A1c MFr Bld: 6.4 % (ref 4.6–6.5)

## 2012-01-08 ENCOUNTER — Other Ambulatory Visit: Payer: Self-pay | Admitting: Family Medicine

## 2012-01-11 ENCOUNTER — Encounter: Payer: Self-pay | Admitting: Family Medicine

## 2012-01-11 NOTE — Telephone Encounter (Signed)
Please call pt to let him know that I would like to increase his Metformin to 500 mg twice daily if he is tolerating Metformin ok. Follow up a1c and lipid panel in 3 months.

## 2012-01-12 NOTE — Telephone Encounter (Signed)
Advised patient.  Lab appt scheduled.  He will call back when he needs new script.

## 2012-01-28 ENCOUNTER — Encounter: Payer: Self-pay | Admitting: Family Medicine

## 2012-02-16 ENCOUNTER — Ambulatory Visit (INDEPENDENT_AMBULATORY_CARE_PROVIDER_SITE_OTHER): Payer: BC Managed Care – PPO | Admitting: Family Medicine

## 2012-02-16 ENCOUNTER — Encounter: Payer: Self-pay | Admitting: Family Medicine

## 2012-02-16 VITALS — BP 140/92 | HR 67 | Temp 98.3°F | Ht 69.5 in | Wt 267.8 lb

## 2012-02-16 DIAGNOSIS — J069 Acute upper respiratory infection, unspecified: Secondary | ICD-10-CM

## 2012-02-16 NOTE — Assessment & Plan Note (Signed)
symptomatic care. 

## 2012-02-16 NOTE — Patient Instructions (Addendum)
You have a virus, no antibiotics are needed. Mucinex DM (guafenesin), nasal saline irrigation 2-3 times a day.  Tylenol or ibuprofen for fever. Expect cough to worsen in next 5-7 days but overall imprvoe day 7-10 of illness. Call if shortness of breath starting, fever continued/rtestarts  late in illness or prolonged symptoms as these could indicated bacterial infection.

## 2012-02-16 NOTE — Progress Notes (Signed)
  Subjective:    Patient ID: Larry Edwards, male    DOB: 08/31/1965, 47 y.o.   MRN: [id number]  Cough This is a new problem. The current episode started in the past 7 days (3 days). The problem has been rapidly worsening. The cough is non-productive. Associated symptoms include chills, ear congestion, nasal congestion and postnasal drip. Pertinent negatives include no myalgias or shortness of breath. Associated symptoms comments: 100.5   no sinus pressure. Nothing aggravates the symptoms. Risk factors for lung disease include smoking/tobacco exposure (Former smoker 20 pack year history, quit 02/25/2011). Treatments tried: guafenesin and ibuprofen. There is no history of asthma, bronchiectasis, bronchitis, COPD, emphysema, environmental allergies or pneumonia.      Review of Systems  Constitutional: Positive for chills.  HENT: Positive for postnasal drip.   Respiratory: Negative for shortness of breath.   Musculoskeletal: Negative for myalgias.  Hematological: Negative for environmental allergies.       Objective:   Physical Exam  Constitutional: Vital signs are normal. He appears well-developed and well-nourished.  Non-toxic appearance. He does not appear ill. No distress.  HENT:  Head: Normocephalic and atraumatic.  Right Ear: Hearing, tympanic membrane, external ear and ear canal normal. No tenderness. No foreign bodies. Tympanic membrane is not retracted and not bulging.  Left Ear: Hearing, tympanic membrane, external ear and ear canal normal. No tenderness. No foreign bodies. Tympanic membrane is not retracted and not bulging.  Nose: Nose normal. No mucosal edema or rhinorrhea. Right sinus exhibits no maxillary sinus tenderness and no frontal sinus tenderness. Left sinus exhibits no maxillary sinus tenderness and no frontal sinus tenderness.  Mouth/Throat: Uvula is midline, oropharynx is clear and moist and mucous membranes are normal. Normal dentition. No dental caries. No  oropharyngeal exudate or tonsillar abscesses.  Eyes: Conjunctivae normal, EOM and lids are normal. Pupils are equal, round, and reactive to light. No foreign bodies found.  Neck: Trachea normal, normal range of motion and phonation normal. Neck supple. Carotid bruit is not present. No mass and no thyromegaly present.  Cardiovascular: Normal rate, regular rhythm, S1 normal, S2 normal, normal heart sounds, intact distal pulses and normal pulses.  Exam reveals no gallop.   No murmur heard. Pulmonary/Chest: Effort normal and breath sounds normal. No respiratory distress. He has no wheezes. He has no rhonchi. He has no rales.  Abdominal: Soft. Normal appearance and bowel sounds are normal. There is no hepatosplenomegaly. There is no tenderness. There is no rebound, no guarding and no CVA tenderness. No hernia.  Neurological: He is alert. He has normal reflexes.  Skin: Skin is warm, dry and intact. No rash noted.  Psychiatric: He has a normal mood and affect. His speech is normal and behavior is normal. Judgment normal.          Assessment & Plan:

## 2012-02-21 ENCOUNTER — Encounter: Payer: Self-pay | Admitting: Family Medicine

## 2012-02-22 ENCOUNTER — Other Ambulatory Visit: Payer: Self-pay | Admitting: *Deleted

## 2012-02-22 MED ORDER — METFORMIN HCL 500 MG PO TABS: 500.0000 mg | ORAL_TABLET | Freq: Two times a day (BID) | ORAL | Status: DC

## 2012-02-22 NOTE — Telephone Encounter (Signed)
Medicine list updated, [facility].

## 2012-04-12 ENCOUNTER — Other Ambulatory Visit (INDEPENDENT_AMBULATORY_CARE_PROVIDER_SITE_OTHER): Payer: BC Managed Care – PPO

## 2012-04-12 DIAGNOSIS — R7309 Other abnormal glucose: Secondary | ICD-10-CM

## 2012-04-12 DIAGNOSIS — R739 Hyperglycemia, unspecified: Secondary | ICD-10-CM

## 2012-04-12 DIAGNOSIS — E785 Hyperlipidemia, unspecified: Secondary | ICD-10-CM

## 2012-04-12 LAB — LIPID PANEL
Cholesterol: 209 mg/dL — ABNORMAL HIGH (ref 0–200)
HDL: 28.8 mg/dL — ABNORMAL LOW (ref >39.00)
Total CHOL/HDL Ratio: 7
Triglycerides: 359 mg/dL — ABNORMAL HIGH (ref 0.0–149.0)
VLDL: 71.8 mg/dL — ABNORMAL HIGH (ref 0.0–40.0)

## 2012-04-12 LAB — LDL CHOLESTEROL, DIRECT: Direct LDL: 116.8 mg/dL

## 2012-04-12 LAB — HEMOGLOBIN A1C: Hgb A1c MFr Bld: 6.2 % (ref 4.6–6.5)

## 2012-04-13 ENCOUNTER — Other Ambulatory Visit: Payer: Self-pay | Admitting: Family Medicine

## 2012-04-13 MED ORDER — FENOFIBRATE 48 MG PO TABS: 48.0000 mg | ORAL_TABLET | Freq: Every day | ORAL | Status: DC

## 2012-04-15 ENCOUNTER — Telehealth: Payer: Self-pay

## 2012-04-15 NOTE — Telephone Encounter (Signed)
Unable to go under result note; pt left v/m requesting lab results.Please advise.

## 2012-04-18 ENCOUNTER — Encounter: Payer: Self-pay | Admitting: Family Medicine

## 2012-04-19 NOTE — Telephone Encounter (Signed)
Lab appt scheduled, advised patient.  He said he's ok with the tricor for now, since he's already paid for it, but he may want something less costly at next go round.

## 2012-06-02 ENCOUNTER — Other Ambulatory Visit (INDEPENDENT_AMBULATORY_CARE_PROVIDER_SITE_OTHER): Payer: BC Managed Care – PPO

## 2012-06-02 ENCOUNTER — Encounter: Payer: Self-pay | Admitting: *Deleted

## 2012-06-02 DIAGNOSIS — E785 Hyperlipidemia, unspecified: Secondary | ICD-10-CM

## 2012-06-02 DIAGNOSIS — R739 Hyperglycemia, unspecified: Secondary | ICD-10-CM

## 2012-06-02 DIAGNOSIS — R7309 Other abnormal glucose: Secondary | ICD-10-CM

## 2012-06-02 DIAGNOSIS — I10 Essential (primary) hypertension: Secondary | ICD-10-CM

## 2012-06-02 LAB — COMPREHENSIVE METABOLIC PANEL WITH GFR
ALT: 24 U/L (ref 0–53)
AST: 21 U/L (ref 0–37)
Albumin: 4.2 g/dL (ref 3.5–5.2)
Alkaline Phosphatase: 36 U/L — ABNORMAL LOW (ref 39–117)
BUN: 17 mg/dL (ref 6–23)
CO2: 27 meq/L (ref 19–32)
Calcium: 8.9 mg/dL (ref 8.4–10.5)
Chloride: 105 meq/L (ref 96–112)
Creatinine, Ser: 1.1 mg/dL (ref 0.4–1.5)
GFR: 74.97 mL/min (ref >60.00)
Glucose, Bld: 105 mg/dL — ABNORMAL HIGH (ref 70–99)
Potassium: 4.3 meq/L (ref 3.5–5.1)
Sodium: 138 meq/L (ref 135–145)
Total Bilirubin: 0.5 mg/dL (ref 0.3–1.2)
Total Protein: 6.8 g/dL (ref 6.0–8.3)

## 2012-06-02 LAB — LIPID PANEL
Cholesterol: 177 mg/dL (ref 0–200)
HDL: 31.1 mg/dL — ABNORMAL LOW
LDL Cholesterol: 123 mg/dL — ABNORMAL HIGH (ref 0–99)
Total CHOL/HDL Ratio: 6
Triglycerides: 117 mg/dL (ref 0.0–149.0)
VLDL: 23.4 mg/dL (ref 0.0–40.0)

## 2012-06-08 ENCOUNTER — Encounter: Payer: Self-pay | Admitting: Family Medicine

## 2012-06-09 ENCOUNTER — Other Ambulatory Visit: Payer: Self-pay | Admitting: Family Medicine

## 2012-06-09 MED ORDER — FENOFIBRATE 54 MG PO TABS: 54.0000 mg | ORAL_TABLET | Freq: Every day | ORAL | Status: DC

## 2012-07-31 ENCOUNTER — Other Ambulatory Visit: Payer: Self-pay | Admitting: Family Medicine

## 2012-10-04 ENCOUNTER — Other Ambulatory Visit: Payer: Self-pay | Admitting: Family Medicine

## 2012-10-04 DIAGNOSIS — I10 Essential (primary) hypertension: Secondary | ICD-10-CM

## 2012-10-04 DIAGNOSIS — R739 Hyperglycemia, unspecified: Secondary | ICD-10-CM

## 2012-10-04 DIAGNOSIS — E785 Hyperlipidemia, unspecified: Secondary | ICD-10-CM

## 2012-10-04 DIAGNOSIS — E559 Vitamin D deficiency, unspecified: Secondary | ICD-10-CM

## 2012-10-04 DIAGNOSIS — Z0000 Encounter for general adult medical examination without abnormal findings: Secondary | ICD-10-CM

## 2012-10-05 ENCOUNTER — Other Ambulatory Visit (INDEPENDENT_AMBULATORY_CARE_PROVIDER_SITE_OTHER): Payer: BC Managed Care – PPO

## 2012-10-05 DIAGNOSIS — E785 Hyperlipidemia, unspecified: Secondary | ICD-10-CM

## 2012-10-05 DIAGNOSIS — I10 Essential (primary) hypertension: Secondary | ICD-10-CM

## 2012-10-05 DIAGNOSIS — R7309 Other abnormal glucose: Secondary | ICD-10-CM

## 2012-10-05 DIAGNOSIS — E559 Vitamin D deficiency, unspecified: Secondary | ICD-10-CM

## 2012-10-05 DIAGNOSIS — R739 Hyperglycemia, unspecified: Secondary | ICD-10-CM

## 2012-10-05 LAB — COMPREHENSIVE METABOLIC PANEL
ALT: 29 U/L (ref 0–53)
AST: 23 U/L (ref 0–37)
Albumin: 4.4 g/dL (ref 3.5–5.2)
Alkaline Phosphatase: 32 U/L — ABNORMAL LOW (ref 39–117)
BUN: 19 mg/dL (ref 6–23)
CO2: 27 mEq/L (ref 19–32)
Calcium: 9.2 mg/dL (ref 8.4–10.5)
Chloride: 106 mEq/L (ref 96–112)
Creatinine, Ser: 1.1 mg/dL (ref 0.4–1.5)
GFR: 78.91 mL/min (ref >60.00)
Glucose, Bld: 108 mg/dL — ABNORMAL HIGH (ref 70–99)
Potassium: 4.6 mEq/L (ref 3.5–5.1)
Sodium: 138 mEq/L (ref 135–145)
Total Bilirubin: 0.4 mg/dL (ref 0.3–1.2)
Total Protein: 7.1 g/dL (ref 6.0–8.3)

## 2012-10-05 LAB — LIPID PANEL
Cholesterol: 204 mg/dL — ABNORMAL HIGH (ref 0–200)
HDL: 32.8 mg/dL — ABNORMAL LOW (ref >39.00)
Total CHOL/HDL Ratio: 6
Triglycerides: 207 mg/dL — ABNORMAL HIGH (ref 0.0–149.0)
VLDL: 41.4 mg/dL — ABNORMAL HIGH (ref 0.0–40.0)

## 2012-10-05 LAB — LDL CHOLESTEROL, DIRECT: Direct LDL: 130.5 mg/dL

## 2012-10-05 LAB — HEMOGLOBIN A1C: Hgb A1c MFr Bld: 6.4 % (ref 4.6–6.5)

## 2012-10-06 LAB — VITAMIN D 25 HYDROXY (VIT D DEFICIENCY, FRACTURES): Vit D, 25-Hydroxy: 40 ng/mL (ref 30–89)

## 2012-10-07 ENCOUNTER — Other Ambulatory Visit: Payer: Self-pay | Admitting: Family Medicine

## 2012-10-10 ENCOUNTER — Encounter: Payer: Self-pay | Admitting: Family Medicine

## 2012-10-10 ENCOUNTER — Ambulatory Visit (INDEPENDENT_AMBULATORY_CARE_PROVIDER_SITE_OTHER): Payer: BC Managed Care – PPO | Admitting: Family Medicine

## 2012-10-10 VITALS — BP 130/66 | HR 67 | Temp 98.0°F | Ht 69.0 in | Wt 265.0 lb

## 2012-10-10 DIAGNOSIS — E785 Hyperlipidemia, unspecified: Secondary | ICD-10-CM

## 2012-10-10 DIAGNOSIS — R7309 Other abnormal glucose: Secondary | ICD-10-CM

## 2012-10-10 DIAGNOSIS — Z0000 Encounter for general adult medical examination without abnormal findings: Secondary | ICD-10-CM

## 2012-10-10 DIAGNOSIS — F411 Generalized anxiety disorder: Secondary | ICD-10-CM | POA: Insufficient documentation

## 2012-10-10 DIAGNOSIS — I10 Essential (primary) hypertension: Secondary | ICD-10-CM

## 2012-10-10 MED ORDER — ESCITALOPRAM OXALATE 20 MG PO TABS: 20.0000 mg | ORAL_TABLET | Freq: Every day | ORAL | Status: DC

## 2012-10-10 MED ORDER — SCOPOLAMINE 1 MG/3DAYS TD PT72: 1.0000 | MEDICATED_PATCH | TRANSDERMAL | Status: DC

## 2012-10-10 MED ORDER — METFORMIN HCL 1000 MG PO TABS: 1000.0000 mg | ORAL_TABLET | Freq: Two times a day (BID) | ORAL | Status: DC

## 2012-10-10 NOTE — Progress Notes (Signed)
47 yo pleasant male here for CPX.  Quit smoking cold [place] 02/2011.    Increased anxiety- past few months, increased "snappiness" and anxiety under situations that normally would not bother him.  Wife has noticed this as well.  Not sleeping well.  Denies feeling depressed.  No SI or HI. Has had more work stressors lately.  Lab Results  Component Value Date   CHOL 204* 10/05/2012   HDL 32.80* 10/05/2012   LDLCALC 123* 06/02/2012   LDLDIRECT 130.5 10/05/2012   TRIG 207.0* 10/05/2012   CHOLHDL 6 10/05/2012   Prediabetes- on Metformin 500 mg twice daily.  Unfortunately, a1c has deteriorated.  He does talk with a diabetic nutritionist at work.  Also, his wife is following a low carb diet.  He feels his diet "is pretty good."  Admits to not exercising.  Lab Results  Component Value Date   HGBA1C 6.4 10/05/2012   Denies increased thirst or urination.  Does have strong FH of DM.  Wt Readings from Last 3 Encounters:  10/10/12 265 lb (120.203 kg)  02/16/12 267 lb 12 oz (121.451 kg)  12/02/11 267 lb (121.11 kg)        Patient Active Problem List   Diagnosis Date Noted  . Elbow pain, left 12/02/2011  . Routine general medical examination at a health care facility 10/02/2010  . Other acute sinusitis 01/16/2010  . HAY FEVER 03/21/2009  . 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 VITAMIN D DEFICIENCY 12/19/2008  . HYPERLIPIDEMIA 08/26/2006  . TOBACCO ABUSE 08/26/2006  . HYPERTENSION 08/26/2006  . GERD 08/26/2006  . GASTRITIS, CHRONIC 08/26/2006  . IRRITABLE BOWEL SYNDROME 08/26/2006  . HYPERGLYCEMIA 08/26/2006   Past Medical History  Diagnosis Date  . GERD (gastroesophageal reflux disease)   . Hyperlipidemia   . Hypertension    Past Surgical History  Procedure Laterality Date  . Tonsillectomy and adenoidectomy      age 32  . Cholecystectomy  01/1995  . Lumbar disc surgery  05/2004   History  Substance Use Topics  . Smoking status: Former Smoker    Quit date:  02/25/2011  . Smokeless tobacco: Not on file  . Alcohol Use: Yes     Comment: rare   Family History  Problem Relation Age of Onset  . Cancer Mother     ovarian  . Diabetes Mother   . Diabetes Father    No Known Allergies Current Outpatient Prescriptions on File Prior to Visit  Medication Sig Dispense Refill  . atenolol (TENORMIN) 50 MG tablet TAKE 1 TABLET DAILY  90 tablet  2  . cholecalciferol (VITAMIN D) 1000 UNITS tablet Take 1,000 Units by mouth 2 (two) times daily.        . fenofibrate 54 MG tablet Take 1 tablet (54 mg total) by mouth daily.  90 tablet  3  . metFORMIN (GLUCOPHAGE) 500 MG tablet TAKE 1 TABLET TWICE A DAY  180 tablet  0   No current facility-administered medications on file prior to visit.   The PMH, PSH, Social History, Family History, Medications, and allergies have been reviewed in [facility], and have been updated if relevant.   ROS: Patient reports no  vision/ hearing changes,anorexia, weight change, fever ,adenopathy, persistant / recurrent hoarseness, swallowing issues, chest pain, edema,persistant / recurrent cough, hemoptysis, dyspnea(rest, exertional, paroxysmal nocturnal), gastrointestinal  bleeding (melena, rectal bleeding), abdominal pain, excessive heart burn, GU symptoms(dysuria, hematuria, pyuria, voiding/incontinence  Issues) syncope, focal weakness, severe memory loss, concerning skin lesions, depression, abnormal  bruising/bleeding, major joint swelling.     Physical Exam  BP 130/66  Pulse 67  Temp(Src) 98 F (36.7 C) (Oral)  Ht 5\' 9"  (1.753 m)  Wt 265 lb (120.203 kg)  BMI 39.12 kg/m2  SpO2 97% Wt Readings from Last 3 Encounters:  10/10/12 265 lb (120.203 kg)  02/16/12 267 lb 12 oz (121.451 kg)  12/02/11 267 lb (121.11 kg)     General:  overweght male in NAD Eyes:  PERRL Ears:  External ear exam shows no significant lesions or deformities.  Otoscopic examination reveals clear canals, tympanic membranes are intact bilaterally without  bulging, retraction, inflammation or discharge. Hearing is grossly normal bilaterally. Nose:  External nasal examination shows no deformity or inflammation. Nasal mucosa are pink and moist without lesions or exudates. Mouth:  Oral mucosa and oropharynx without lesions or exudates.  Teeth in good repair. Neck:  no carotid bruit or thyromegaly no cervical or supraclavicular lymphadenopathy  Lungs:  Normal respiratory effort, chest expands symmetrically. Lungs are clear to auscultation, no crackles or wheezes. Heart:  Normal rate and regular rhythm. S1 and S2 normal without gallop, murmur, click, rub or other extra sounds. Abdomen:  Bowel sounds positive,abdomen soft and non-tender without masses, organomegaly or hernias noted. Extremities:  no edema   Assessment and Plan:  1. Routine general medical examination at a health care facility Reviewed preventive care protocols, scheduled due services, and updated immunizations Discussed nutrition, exercise, diet, and healthy lifestyle.   2. HYPERTENSION Stable.  No rx changes.  3. HYPERLIPIDEMIA Deteriorated- advised increased physical activity.  4. HYPERGLYCEMIA Very close to DM at this point.  Increase Metformin to 1000 mg twice daily. Repeat a1c in 3 months.  Increase physical activity. The patient indicates understanding of these issues and agrees with the plan.   5. Anxiety state, unspecified New. Discussed tx options.  He would like to try low dose SSRI.  Will start Lexapro 20 mg daily- start out taking 10 mg daily for first week. Follow up by my chart or phone in 3 weeks. The patient indicates understanding of these issues and agrees with the plan.

## 2012-10-10 NOTE — Patient Instructions (Addendum)
We are increasing your metformin to 1000 mg twice daily. Let's plan on rechecking an a1c in 3 months (make lab appointment).  We are starting 10 mg nightly.  Send me a message with an update.

## 2012-11-07 LAB — HM DIABETES EYE EXAM

## 2012-11-17 ENCOUNTER — Other Ambulatory Visit: Payer: Self-pay

## 2012-12-22 ENCOUNTER — Encounter: Payer: Self-pay | Admitting: Family Medicine

## 2012-12-23 MED ORDER — ESCITALOPRAM OXALATE 20 MG PO TABS: 20.0000 mg | ORAL_TABLET | Freq: Every day | ORAL | Status: DC

## 2013-01-04 ENCOUNTER — Other Ambulatory Visit: Payer: Self-pay | Admitting: Family Medicine

## 2013-01-04 DIAGNOSIS — E119 Type 2 diabetes mellitus without complications: Secondary | ICD-10-CM

## 2013-01-09 ENCOUNTER — Other Ambulatory Visit (INDEPENDENT_AMBULATORY_CARE_PROVIDER_SITE_OTHER): Payer: BC Managed Care – PPO

## 2013-01-09 DIAGNOSIS — E119 Type 2 diabetes mellitus without complications: Secondary | ICD-10-CM

## 2013-01-09 LAB — HEMOGLOBIN A1C: Hgb A1c MFr Bld: 6.4 % (ref 4.6–6.5)

## 2013-01-27 ENCOUNTER — Telehealth: Payer: Self-pay

## 2013-01-27 NOTE — Telephone Encounter (Signed)
Relevant patient education assigned to patient using Emmi. ° °

## 2013-02-02 ENCOUNTER — Encounter: Payer: Self-pay | Admitting: Family Medicine

## 2013-03-25 ENCOUNTER — Other Ambulatory Visit: Payer: Self-pay | Admitting: Family Medicine

## 2013-04-20 ENCOUNTER — Other Ambulatory Visit: Payer: Self-pay

## 2013-04-23 ENCOUNTER — Encounter: Payer: Self-pay | Admitting: Family Medicine

## 2013-05-09 ENCOUNTER — Encounter: Payer: Self-pay | Admitting: Family Medicine

## 2013-05-09 ENCOUNTER — Ambulatory Visit (INDEPENDENT_AMBULATORY_CARE_PROVIDER_SITE_OTHER): Payer: BC Managed Care – PPO | Admitting: Family Medicine

## 2013-05-09 VITALS — BP 124/68 | HR 62 | Temp 97.8°F | Ht 69.5 in | Wt 268.5 lb

## 2013-05-09 DIAGNOSIS — R7309 Other abnormal glucose: Secondary | ICD-10-CM

## 2013-05-09 DIAGNOSIS — E785 Hyperlipidemia, unspecified: Secondary | ICD-10-CM

## 2013-05-09 DIAGNOSIS — M25561 Pain in right knee: Secondary | ICD-10-CM | POA: Insufficient documentation

## 2013-05-09 DIAGNOSIS — F411 Generalized anxiety disorder: Secondary | ICD-10-CM

## 2013-05-09 DIAGNOSIS — R7303 Prediabetes: Secondary | ICD-10-CM | POA: Insufficient documentation

## 2013-05-09 DIAGNOSIS — M25569 Pain in unspecified knee: Secondary | ICD-10-CM

## 2013-05-09 DIAGNOSIS — M25562 Pain in left knee: Secondary | ICD-10-CM

## 2013-05-09 LAB — COMPREHENSIVE METABOLIC PANEL
ALT: 26 U/L (ref 0–53)
AST: 21 U/L (ref 0–37)
Albumin: 4.5 g/dL (ref 3.5–5.2)
Alkaline Phosphatase: 40 U/L (ref 39–117)
BUN: 21 mg/dL (ref 6–23)
CO2: 25 mEq/L (ref 19–32)
Calcium: 9.5 mg/dL (ref 8.4–10.5)
Chloride: 104 mEq/L (ref 96–112)
Creatinine, Ser: 1.1 mg/dL (ref 0.4–1.5)
GFR: 77.87 mL/min (ref >60.00)
Glucose, Bld: 106 mg/dL — ABNORMAL HIGH (ref 70–99)
Potassium: 4.3 mEq/L (ref 3.5–5.1)
Sodium: 137 mEq/L (ref 135–145)
Total Bilirubin: 0.1 mg/dL — ABNORMAL LOW (ref 0.3–1.2)
Total Protein: 7.1 g/dL (ref 6.0–8.3)

## 2013-05-09 LAB — LIPID PANEL
Cholesterol: 211 mg/dL — ABNORMAL HIGH (ref 0–200)
HDL: 29 mg/dL — ABNORMAL LOW
LDL Cholesterol: 92 mg/dL (ref 0–99)
Total CHOL/HDL Ratio: 7
Triglycerides: 452 mg/dL — ABNORMAL HIGH (ref 0.0–149.0)
VLDL: 90.4 mg/dL — ABNORMAL HIGH (ref 0.0–40.0)

## 2013-05-09 LAB — HEMOGLOBIN A1C: Hgb A1c MFr Bld: 6 % (ref 4.6–6.5)

## 2013-05-09 MED ORDER — ATENOLOL 50 MG PO TABS: ORAL_TABLET | ORAL | Status: DC

## 2013-05-09 NOTE — Patient Instructions (Signed)
Good to see you. We will call you with your lab results.   

## 2013-05-09 NOTE — Assessment & Plan Note (Signed)
Improved with lexapro. No changes to current dose.

## 2013-05-09 NOTE — Progress Notes (Signed)
Pre visit review using our clinic review tool, if applicable. No additional management support is needed unless otherwise documented below in the visit note. 

## 2013-05-09 NOTE — Progress Notes (Signed)
48 yo pleasant male here for follow up.  Bilateral knee pain- ongoing for several months.  Seems to be worse when sitting or standing from seated position.  No known injury.  He has not been walking much due to plantar fascitis.  HLD- Lab Results  Component Value Date   CHOL 204* 10/05/2012   HDL 32.80* 10/05/2012   LDLCALC 123* 06/02/2012   LDLDIRECT 130.5 10/05/2012   TRIG 207.0* 10/05/2012   CHOLHDL 6 10/05/2012   Prediabetes- on Metformin 1000 mg twice daily- increased in 09/2012.  He does talk with a diabetic nutritionist at work.  Also, his wife is following a low carb diet.  Admits to not exercising. Denies any loose stools or abdominal discomfort.  Lab Results  Component Value Date   HGBA1C 6.4 01/09/2013   Denies increased thirst or urination.  Does have strong FH of DM.  Wt Readings from Last 3 Encounters:  05/09/13 268 lb 8 oz (121.791 kg)  10/10/12 265 lb (120.203 kg)  02/16/12 267 lb 12 oz (121.451 kg)    Anxiety- started Lexapro 20 mg daily in 09/2012.  He has noticed improvement in how he reacts to certain situations.  He is irritated today because "we made him follow up today."    Patient Active Problem List   Diagnosis Date Noted  . Anxiety state, unspecified 10/10/2012  . Elbow pain, left 12/02/2011  . Routine general medical examination at a health care facility 10/02/2010  . HAY FEVER 03/21/2009  . 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 VITAMIN D DEFICIENCY 12/19/2008  . HYPERLIPIDEMIA 08/26/2006  . TOBACCO ABUSE 08/26/2006  . HYPERTENSION 08/26/2006  . GERD 08/26/2006  . GASTRITIS, CHRONIC 08/26/2006  . IRRITABLE BOWEL SYNDROME 08/26/2006  . HYPERGLYCEMIA 08/26/2006   Past Medical History  Diagnosis Date  . GERD (gastroesophageal reflux disease)   . Hyperlipidemia   . Hypertension    Past Surgical History  Procedure Laterality Date  . Tonsillectomy and adenoidectomy      age 3  . Cholecystectomy  01/1995  . Lumbar disc surgery   05/2004   History  Substance Use Topics  . Smoking status: Former Smoker    Quit date: 02/25/2011  . Smokeless tobacco: Not on file  . Alcohol Use: Yes     Comment: rare   Family History  Problem Relation Age of Onset  . Cancer Mother     ovarian  . Diabetes Mother   . Diabetes Father    No Known Allergies Current Outpatient Prescriptions on File Prior to Visit  Medication Sig Dispense Refill  . cholecalciferol (VITAMIN D) 1000 UNITS tablet Take 1,000 Units by mouth 2 (two) times daily.        Marland Kitchen escitalopram (LEXAPRO) 20 MG tablet Take 1 tablet (20 mg total) by mouth daily.  90 tablet  1  . fenofibrate 54 MG tablet Take 1 tablet (54 mg total) by mouth daily.  90 tablet  3  . metFORMIN (GLUCOPHAGE) 1000 MG tablet Take 1 tablet (1,000 mg total) by mouth 2 (two) times daily with a meal.  180 tablet  3  . scopolamine (TRANSDERM-SCOP) 1.5 MG Place 1 patch (1.5 mg total) onto the skin every 3 (three) days.  10 patch  12   No current facility-administered medications on file prior to visit.   The PMH, PSH, Social History, Family History, Medications, and allergies have been reviewed in [facility], and have been updated if relevant.   ROS: See HPI  Physical Exam  BP 124/68  Pulse 62  Temp(Src) 97.8 F (36.6 C) (Oral)  Ht 5' 9.5" (1.765 m)  Wt 268 lb 8 oz (121.791 kg)  BMI 39.10 kg/m2  SpO2 97% Wt Readings from Last 3 Encounters:  05/09/13 268 lb 8 oz (121.791 kg)  10/10/12 265 lb (120.203 kg)  02/16/12 267 lb 12 oz (121.451 kg)     General:  overweght male in NAD Eyes:  PERRL Ears:  External ear exam shows no significant lesions or deformities.  Otoscopic examination reveals clear canals, tympanic membranes are intact bilaterally without bulging, retraction, inflammation or discharge. Hearing is grossly normal bilaterally. Nose:  External nasal examination shows no deformity or inflammation. Nasal mucosa are pink and moist without lesions or exudates. Mouth:  Oral mucosa and  oropharynx without lesions or exudates.  Teeth in good repair. Neck:  no carotid bruit or thyromegaly no cervical or supraclavicular lymphadenopathy  Lungs:  Normal respiratory effort, chest expands symmetrically. Lungs are clear to auscultation, no crackles or wheezes. Heart:  Normal rate and regular rhythm. S1 and S2 normal without gallop, murmur, click, rub or other extra sounds. Abdomen:  Bowel sounds positive,abdomen soft and non-tender without masses, organomegaly or hernias noted. Extremities:  no edema  MSK:   + crepitus bilaterally No effusions or warmth Neg anterior drawer and neg grind tests

## 2013-05-09 NOTE — Assessment & Plan Note (Signed)
Recheck a1c today. 

## 2013-05-09 NOTE — Assessment & Plan Note (Signed)
Probable OA. Explained weight loss and increased activity will help with his symptoms.

## 2013-05-09 NOTE — Assessment & Plan Note (Signed)
Recheck labs today. 

## 2013-05-19 ENCOUNTER — Other Ambulatory Visit: Payer: Self-pay | Admitting: Family Medicine

## 2013-05-22 ENCOUNTER — Other Ambulatory Visit: Payer: Self-pay | Admitting: Family Medicine

## 2013-05-22 MED ORDER — FENOFIBRATE 145 MG PO TABS: 145.0000 mg | ORAL_TABLET | Freq: Every day | ORAL | Status: DC

## 2013-05-22 NOTE — Progress Notes (Signed)
rx sent

## 2013-05-25 ENCOUNTER — Other Ambulatory Visit: Payer: Self-pay | Admitting: Family Medicine

## 2013-05-25 MED ORDER — FENOFIBRATE MICRONIZED 134 MG PO CAPS: 134.0000 mg | ORAL_CAPSULE | Freq: Every day | ORAL | Status: DC

## 2013-05-25 NOTE — Telephone Encounter (Signed)
Contacted pt and confirm that I had also spoken with the pharmacy and they are awaiting a call from accounts receivable to receive a full refund. He will be credited the remaining amount; what he was billed versus what he has to pay.

## 2013-05-26 ENCOUNTER — Encounter: Payer: Self-pay | Admitting: Family Medicine

## 2013-05-29 ENCOUNTER — Encounter: Payer: Self-pay | Admitting: Family Medicine

## 2013-05-29 NOTE — Telephone Encounter (Signed)
Pt responded to informing him that 134mg  is what he requested to be sent to him.

## 2013-05-30 NOTE — Telephone Encounter (Signed)
Attempted to contact pt. Phone answered but line d/c

## 2013-07-04 LAB — HM DIABETES EYE EXAM

## 2013-07-27 ENCOUNTER — Other Ambulatory Visit: Payer: Self-pay | Admitting: Family Medicine

## 2013-07-27 DIAGNOSIS — E785 Hyperlipidemia, unspecified: Secondary | ICD-10-CM

## 2013-07-31 ENCOUNTER — Encounter: Payer: Self-pay | Admitting: Family Medicine

## 2013-07-31 ENCOUNTER — Other Ambulatory Visit (INDEPENDENT_AMBULATORY_CARE_PROVIDER_SITE_OTHER): Payer: BC Managed Care – PPO

## 2013-07-31 DIAGNOSIS — E785 Hyperlipidemia, unspecified: Secondary | ICD-10-CM

## 2013-07-31 LAB — LIPID PANEL
Cholesterol: 180 mg/dL (ref 0–200)
HDL: 31.4 mg/dL — ABNORMAL LOW (ref >39.00)
LDL Cholesterol: 98 mg/dL (ref 0–99)
NonHDL: 148.6
Total CHOL/HDL Ratio: 6
Triglycerides: 251 mg/dL — ABNORMAL HIGH (ref 0.0–149.0)
VLDL: 50.2 mg/dL — ABNORMAL HIGH (ref 0.0–40.0)

## 2013-07-31 LAB — HEPATIC FUNCTION PANEL
ALT: 26 U/L (ref 0–53)
AST: 22 U/L (ref 0–37)
Albumin: 4.2 g/dL (ref 3.5–5.2)
Alkaline Phosphatase: 34 U/L — ABNORMAL LOW (ref 39–117)
Bilirubin, Direct: 0 mg/dL (ref 0.0–0.3)
Total Bilirubin: 0.4 mg/dL (ref 0.2–1.2)
Total Protein: 6.7 g/dL (ref 6.0–8.3)

## 2013-08-02 ENCOUNTER — Other Ambulatory Visit: Payer: Self-pay | Admitting: Family Medicine

## 2013-08-07 MED ORDER — FENOFIBRATE 160 MG PO TABS: 160.0000 mg | ORAL_TABLET | Freq: Every day | ORAL | Status: DC

## 2013-08-07 NOTE — Telephone Encounter (Signed)
Pt responded to via mychart; Rx sent to requested pharmacy

## 2013-08-25 ENCOUNTER — Encounter: Payer: Self-pay | Admitting: Family Medicine

## 2013-08-31 ENCOUNTER — Encounter: Payer: Self-pay | Admitting: Family Medicine

## 2013-09-03 NOTE — Progress Notes (Signed)
Formatting of this note is different from the original.  Subjective:      Patient ID: Andres Johnson is a 48 y.o. male.    HPI     Pink Eye   The problem is  new.  This problem has existed for  1 day.  This problem  occurs constantly.  Since onset, the problem has been  gradually worsening (Gradually getting redder and increased discharge).  The  right eye is affected.  Associated risk factors include contact lenses.  Associated symptoms include discharge, redness and itching (burning sensation at times ).  Negative for blurred vision, swelling, decreased vision, limited eye movement, photophobia, tearing and foreign body sensation.  Treatments tried include a compress.  Treatments provided  mild relief.        Comments   Pt is now starting to feel like the left eye is getting irritated     Crusting on lashes this morning. Pt wiping eye through out the day              Objective:     Physical Exam   Constitutional: He appears well-developed and well-nourished.   Eyes: EOM and lids are normal. Pupils are equal, round, and reactive to light. Right conjunctiva is injected. Left conjunctiva is injected (mild).   Right eye: sclera injection mostly to right portion on sclera. Conjunctiva injected. Crusting to lid. Catha Nottingham noted in corner of conjunctiva ( pt does not feel lash in eye )    Used moisten sterile q-tip to grab the outter part of lash that was on lid-corner to remove. Pt tolerated well       Lymphadenopathy:        Head (right side): No submental, no submandibular, no tonsillar, no preauricular, no posterior auricular and no occipital adenopathy present.        Head (left side): No submental, no tonsillar, no preauricular, no posterior auricular and no occipital adenopathy present.     He has no cervical adenopathy.   Vitals reviewed.      Assessment/Plan:        Conjunctivitis   Vigamox   Go immediately to the ER for visual changes, increased pain in the eye, if the light causes eye sensitivity or an increase in  eye discharge.     Follow up with primary care provider if no improvement within 24 hours         Electronically signed by Madaline Savage Id-Din, NP at 09/03/2013  2:37 PM EDT

## 2013-10-02 ENCOUNTER — Other Ambulatory Visit: Payer: Self-pay | Admitting: Family Medicine

## 2013-10-02 DIAGNOSIS — R7309 Other abnormal glucose: Secondary | ICD-10-CM

## 2013-10-02 DIAGNOSIS — E785 Hyperlipidemia, unspecified: Secondary | ICD-10-CM

## 2013-10-02 DIAGNOSIS — I10 Essential (primary) hypertension: Secondary | ICD-10-CM

## 2013-10-02 DIAGNOSIS — Z125 Encounter for screening for malignant neoplasm of prostate: Secondary | ICD-10-CM

## 2013-10-02 DIAGNOSIS — Z0000 Encounter for general adult medical examination without abnormal findings: Secondary | ICD-10-CM

## 2013-10-06 ENCOUNTER — Other Ambulatory Visit (INDEPENDENT_AMBULATORY_CARE_PROVIDER_SITE_OTHER): Payer: BC Managed Care – PPO

## 2013-10-06 DIAGNOSIS — E785 Hyperlipidemia, unspecified: Secondary | ICD-10-CM

## 2013-10-06 DIAGNOSIS — I10 Essential (primary) hypertension: Secondary | ICD-10-CM

## 2013-10-06 DIAGNOSIS — Z0000 Encounter for general adult medical examination without abnormal findings: Secondary | ICD-10-CM

## 2013-10-06 DIAGNOSIS — R7309 Other abnormal glucose: Secondary | ICD-10-CM

## 2013-10-06 DIAGNOSIS — Z125 Encounter for screening for malignant neoplasm of prostate: Secondary | ICD-10-CM

## 2013-10-06 LAB — COMPREHENSIVE METABOLIC PANEL
ALT: 27 U/L (ref 0–53)
AST: 20 U/L (ref 0–37)
Albumin: 4.5 g/dL (ref 3.5–5.2)
Alkaline Phosphatase: 34 U/L — ABNORMAL LOW (ref 39–117)
BUN: 20 mg/dL (ref 6–23)
CO2: 23 mEq/L (ref 19–32)
Calcium: 9.2 mg/dL (ref 8.4–10.5)
Chloride: 105 mEq/L (ref 96–112)
Creatinine, Ser: 1.3 mg/dL (ref 0.4–1.5)
GFR: 65.66 mL/min (ref >60.00)
Glucose, Bld: 103 mg/dL — ABNORMAL HIGH (ref 70–99)
Potassium: 4.8 mEq/L (ref 3.5–5.1)
Sodium: 135 mEq/L (ref 135–145)
Total Bilirubin: 0.4 mg/dL (ref 0.2–1.2)
Total Protein: 6.9 g/dL (ref 6.0–8.3)

## 2013-10-06 LAB — CBC WITH DIFFERENTIAL/PLATELET
Basophils Absolute: 0 10*3/uL (ref 0.0–0.1)
Basophils Relative: 0.6 % (ref 0.0–3.0)
Eosinophils Absolute: 0.3 10*3/uL (ref 0.0–0.7)
Eosinophils Relative: 3.6 % (ref 0.0–5.0)
HCT: 42 % (ref 39.0–52.0)
Hemoglobin: 13.9 g/dL (ref 13.0–17.0)
Lymphocytes Relative: 34.9 % (ref 12.0–46.0)
Lymphs Abs: 2.5 10*3/uL (ref 0.7–4.0)
MCHC: 33.1 g/dL (ref 30.0–36.0)
MCV: 87.1 fl (ref 78.0–100.0)
Monocytes Absolute: 0.7 10*3/uL (ref 0.1–1.0)
Monocytes Relative: 9.8 % (ref 3.0–12.0)
Neutro Abs: 3.6 10*3/uL (ref 1.4–7.7)
Neutrophils Relative %: 51.1 % (ref 43.0–77.0)
Platelets: 237 10*3/uL (ref 150.0–400.0)
RBC: 4.82 Mil/uL (ref 4.22–5.81)
RDW: 13.8 % (ref 11.5–15.5)
WBC: 7.1 10*3/uL (ref 4.0–10.5)

## 2013-10-06 LAB — PSA: PSA: 0.62 ng/mL (ref 0.10–4.00)

## 2013-10-06 LAB — LIPID PANEL
Cholesterol: 191 mg/dL (ref 0–200)
HDL: 32.1 mg/dL — ABNORMAL LOW (ref >39.00)
LDL Cholesterol: 131 mg/dL — ABNORMAL HIGH (ref 0–99)
NonHDL: 158.9
Total CHOL/HDL Ratio: 6
Triglycerides: 139 mg/dL (ref 0.0–149.0)
VLDL: 27.8 mg/dL (ref 0.0–40.0)

## 2013-10-09 ENCOUNTER — Ambulatory Visit (INDEPENDENT_AMBULATORY_CARE_PROVIDER_SITE_OTHER): Payer: BC Managed Care – PPO | Admitting: Family Medicine

## 2013-10-09 ENCOUNTER — Encounter: Payer: Self-pay | Admitting: Family Medicine

## 2013-10-09 VITALS — BP 136/74 | HR 62 | Temp 98.5°F | Ht 69.5 in | Wt 265.8 lb

## 2013-10-09 DIAGNOSIS — E785 Hyperlipidemia, unspecified: Secondary | ICD-10-CM

## 2013-10-09 DIAGNOSIS — Z0000 Encounter for general adult medical examination without abnormal findings: Secondary | ICD-10-CM

## 2013-10-09 DIAGNOSIS — F411 Generalized anxiety disorder: Secondary | ICD-10-CM

## 2013-10-09 MED ORDER — METFORMIN HCL 1000 MG PO TABS: 1000.0000 mg | ORAL_TABLET | Freq: Two times a day (BID) | ORAL | Status: DC

## 2013-10-09 NOTE — Patient Instructions (Signed)
Great to see you. Let's continue current medicines, please work on your diet. Start biking again. Return in 2-3 months for labs.

## 2013-10-09 NOTE — Assessment & Plan Note (Signed)
Deteriorated. Discussed dietary changes and hand out given. Recheck lipid panel, a1c in 3 months.

## 2013-10-09 NOTE — Assessment & Plan Note (Signed)
Stable on current rx. No changes made. 

## 2013-10-09 NOTE — Progress Notes (Signed)
48 yo pleasant male here for CPX.  Quit smoking cold [place] 02/2011.    Sees a dermatologist twice a year- next appt first week in November.   Anxiety- feels better on lexapro.  Denies any side effects.  Wife has noticed he is less "snappy" when he takes it.  Denies feeling depressed.  No SI or HI. Has had more work stressors lately.  HLD- LDL increased this month.  He is taking his rx as prescribed.  Admits to eating out more.   Lab Results  Component Value Date   CHOL 191 10/06/2013   HDL 32.10* 10/06/2013   LDLCALC 131* 10/06/2013   LDLDIRECT 130.5 10/05/2012   TRIG 139.0 10/06/2013   CHOLHDL 6 10/06/2013   Prediabetes- on Metformin 1000 mg twice daily.  Lab Results  Component Value Date   HGBA1C 6.0 05/09/2013   Denies increased thirst or urination.  Does have strong FH of DM.  Wt Readings from Last 3 Encounters:  10/09/13 265 lb 12 oz (120.543 kg)  05/09/13 268 lb 8 oz (121.791 kg)  10/10/12 265 lb (120.203 kg)        Patient Active Problem List   Diagnosis Date Noted  . Prediabetes 05/09/2013  . Knee pain, bilateral 05/09/2013  . Anxiety state, unspecified 10/10/2012  . Elbow pain, left 12/02/2011  . Routine general medical examination at a health care facility 10/02/2010  . HAY FEVER 03/21/2009  . 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 VITAMIN D DEFICIENCY 12/19/2008  . HYPERLIPIDEMIA 08/26/2006  . TOBACCO ABUSE 08/26/2006  . HYPERTENSION 08/26/2006  . GERD 08/26/2006  . GASTRITIS, CHRONIC 08/26/2006  . IRRITABLE BOWEL SYNDROME 08/26/2006  . HYPERGLYCEMIA 08/26/2006   Past Medical History  Diagnosis Date  . GERD (gastroesophageal reflux disease)   . Hyperlipidemia   . Hypertension    Past Surgical History  Procedure Laterality Date  . Tonsillectomy and adenoidectomy      age 18  . Cholecystectomy  01/1995  . Lumbar disc surgery  05/2004   History  Substance Use Topics  . Smoking status: Former Smoker    Quit date: 02/25/2011  .  Smokeless tobacco: Not on file  . Alcohol Use: Yes     Comment: rare   Family History  Problem Relation Age of Onset  . Cancer Mother     ovarian  . Diabetes Mother   . Diabetes Father    No Known Allergies Current Outpatient Prescriptions on File Prior to Visit  Medication Sig Dispense Refill  . atenolol (TENORMIN) 50 MG tablet TAKE 1 TABLET DAILY (OFFICE VISIT REQUIRED FOR ADDITIONAL REFILLS)  90 tablet  1  . cholecalciferol (VITAMIN D) 1000 UNITS tablet Take 1,000 Units by mouth 2 (two) times daily.        Marland Kitchen escitalopram (LEXAPRO) 20 MG tablet Take 1 tablet (20 mg total) by mouth daily.  90 tablet  1  . fenofibrate 160 MG tablet Take 1 tablet (160 mg total) by mouth daily.  90 tablet  0   No current facility-administered medications on file prior to visit.   The PMH, PSH, Social History, Family History, Medications, and allergies have been reviewed in [facility], and have been updated if relevant.   ROS: Patient reports no  vision/ hearing changes,anorexia, weight change, fever ,adenopathy, persistant / recurrent hoarseness, swallowing issues, chest pain, edema,persistant / recurrent cough, hemoptysis, dyspnea(rest, exertional, paroxysmal nocturnal), gastrointestinal  bleeding (melena, rectal bleeding), abdominal pain, excessive heart burn, GU symptoms(dysuria, hematuria, pyuria, voiding/incontinence  Issues) syncope, focal weakness, severe memory loss, concerning skin lesions, depression, abnormal bruising/bleeding, major joint swelling.     Physical Exam  BP 136/74  Pulse 62  Temp(Src) 98.5 F (36.9 C) (Oral)  Ht 5' 9.5" (1.765 m)  Wt 265 lb 12 oz (120.543 kg)  BMI 38.69 kg/m2  SpO2 97% Wt Readings from Last 3 Encounters:  10/09/13 265 lb 12 oz (120.543 kg)  05/09/13 268 lb 8 oz (121.791 kg)  10/10/12 265 lb (120.203 kg)     General:  overweght male in NAD Eyes:  PERRL Ears:  External ear exam shows no significant lesions or deformities.  Otoscopic examination reveals  clear canals, tympanic membranes are intact bilaterally without bulging, retraction, inflammation or discharge. Hearing is grossly normal bilaterally. Nose:  External nasal examination shows no deformity or inflammation. Nasal mucosa are pink and moist without lesions or exudates. Mouth:  Oral mucosa and oropharynx without lesions or exudates.  Teeth in good repair. Neck:  no carotid bruit or thyromegaly no cervical or supraclavicular lymphadenopathy  Lungs:  Normal respiratory effort, chest expands symmetrically. Lungs are clear to auscultation, no crackles or wheezes. Heart:  Normal rate and regular rhythm. S1 and S2 normal without gallop, murmur, click, rub or other extra sounds. Abdomen:  Bowel sounds positive,abdomen soft and non-tender without masses, organomegaly or hernias noted. Extremities:  no edema

## 2013-10-09 NOTE — Assessment & Plan Note (Signed)
Reviewed preventive care protocols, scheduled due services, and updated immunizations Discussed nutrition, exercise, diet, and healthy lifestyle.  

## 2013-11-23 ENCOUNTER — Ambulatory Visit (INDEPENDENT_AMBULATORY_CARE_PROVIDER_SITE_OTHER): Payer: BC Managed Care – PPO | Admitting: Family Medicine

## 2013-11-23 ENCOUNTER — Encounter: Payer: Self-pay | Admitting: Family Medicine

## 2013-11-23 VITALS — BP 128/78 | HR 61 | Temp 98.3°F | Wt 268.8 lb

## 2013-11-23 DIAGNOSIS — R1030 Lower abdominal pain, unspecified: Secondary | ICD-10-CM

## 2013-11-23 DIAGNOSIS — R109 Unspecified abdominal pain: Secondary | ICD-10-CM

## 2013-11-23 DIAGNOSIS — R1031 Right lower quadrant pain: Secondary | ICD-10-CM | POA: Insufficient documentation

## 2013-11-23 NOTE — Patient Instructions (Signed)
Good to see you. Please bring a urine sample by the office tomorrow. Stop taking your Ibuprofen and update me with your symptoms.

## 2013-11-23 NOTE — Progress Notes (Signed)
Pre visit review using our clinic review tool, if applicable. No additional management support is needed unless otherwise documented below in the visit note. 

## 2013-11-23 NOTE — Assessment & Plan Note (Signed)
Pain currently resolved. He was unable to leave urine sample.  Advised him to return to lab for urine sample or to bring one in. Exam reassuring- cannot rule out kidney stones or other process.  I did suggest he stop Ibuprofen to see if pain returns.  Would need imaging such as CT scan/scrotal US if symptoms persist. The patient indicates understanding of these issues and agrees with the plan.

## 2013-11-23 NOTE — Progress Notes (Signed)
Subjective:    Patient ID: Larry Edwards, male    DOB: April 18, 1965, 48 y.o.   MRN: [id number]  HPI  48 yo pleasant male here for:  Right sided groin pain- noticed some right suprapubic pain 3 days ago- constant, did not seem to be worse with certain activities.  Then his right testicle became "sensitive."  He is not sure if was swollen.  Some back pain.  Some nausea without vomiting. No hematuria or difficulty urinating.  No changes in his bowel habits.  Since yesterday, has been taking Ibuprofen regularly and symptoms have resolved.  No h/o kidney stones.  Current Outpatient Prescriptions on File Prior to Visit  Medication Sig Dispense Refill  . atenolol (TENORMIN) 50 MG tablet TAKE 1 TABLET DAILY (OFFICE VISIT REQUIRED FOR ADDITIONAL REFILLS) 90 tablet 1  . cholecalciferol (VITAMIN D) 1000 UNITS tablet Take 1,000 Units by mouth 2 (two) times daily.      Marland Kitchen escitalopram (LEXAPRO) 20 MG tablet Take 1 tablet (20 mg total) by mouth daily. 90 tablet 1  . fenofibrate 160 MG tablet Take 1 tablet (160 mg total) by mouth daily. 90 tablet 0  . metFORMIN (GLUCOPHAGE) 1000 MG tablet Take 1 tablet (1,000 mg total) by mouth 2 (two) times daily with a meal. 180 tablet 1   No current facility-administered medications on file prior to visit.    No Known Allergies  Past Medical History  Diagnosis Date  . GERD (gastroesophageal reflux disease)   . Hyperlipidemia   . Hypertension     Past Surgical History  Procedure Laterality Date  . Tonsillectomy and adenoidectomy      age 48  . Cholecystectomy  01/1995  . Lumbar disc surgery  05/2004    Family History  Problem Relation Age of Onset  . Cancer Mother     ovarian  . Diabetes Mother   . Diabetes Father     History   Social History  . Marital Status: Single    Spouse Name: N/A    Number of Children: N/A  . Years of Education: N/A   Occupational History  . Not on file.   Social History Main Topics  . Smoking status:  Former Smoker    Quit date: 02/25/2011  . Smokeless tobacco: Not on file  . Alcohol Use: Yes     Comment: rare  . Drug Use: No  . Sexual Activity: Not on file   Other Topics Concern  . Not on file   Social History Narrative   The PMH, PSH, Social History, Family History, Medications, and allergies have been reviewed in [facility], and have been updated if relevant.   Review of Systems  Constitutional: Negative for fever.  Gastrointestinal: Negative for abdominal pain, diarrhea, constipation and abdominal distention.  Genitourinary: Positive for flank pain and testicular pain. Negative for dysuria, urgency, hematuria, decreased urine volume and penile pain.  All other systems reviewed and are negative.      Objective:   Physical Exam  Constitutional: He is oriented to person, place, and time. He appears well-developed. No distress.  obese  Abdominal: Soft. Bowel sounds are normal. He exhibits no distension and no mass. There is no tenderness. There is no rebound and no guarding. Hernia confirmed negative in the right inguinal area.  Genitourinary: Right testis shows no mass, no swelling and no tenderness.  Lymphadenopathy:       Right: No inguinal adenopathy present.  Neurological: He is alert and oriented to person, place, and  time.  Skin: Skin is warm, dry and intact.  Psychiatric: He has a normal mood and affect. His behavior is normal. Thought content normal.  Nursing note and vitals reviewed.   BP 128/78 mmHg  Pulse 61  Temp(Src) 98.3 F (36.8 C) (Oral)  Wt 268 lb 12 oz (121.904 kg)  SpO2 96%       Assessment & Plan:

## 2013-11-24 ENCOUNTER — Encounter: Payer: Self-pay | Admitting: Family Medicine

## 2013-11-24 LAB — POCT URINALYSIS DIPSTICK
Bilirubin, UA: NEGATIVE
Blood, UA: NEGATIVE
Glucose, UA: NEGATIVE
Ketones, UA: NEGATIVE
Leukocytes, UA: NEGATIVE
Nitrite, UA: NEGATIVE
Protein, UA: NEGATIVE
Spec Grav, UA: 1.025
Urobilinogen, UA: 0.2
pH, UA: 6

## 2013-11-24 NOTE — Addendum Note (Signed)
Addended by: Modena Nunnery on: 11/24/2013 01:03 PM   Modules accepted: Orders

## 2013-11-24 NOTE — Telephone Encounter (Signed)
Pt came into office today and provided urine sample for urinalysis. Results entered.

## 2013-12-10 ENCOUNTER — Other Ambulatory Visit: Payer: Self-pay | Admitting: Internal Medicine

## 2013-12-10 ENCOUNTER — Other Ambulatory Visit: Payer: Self-pay | Admitting: Family Medicine

## 2013-12-11 NOTE — Telephone Encounter (Signed)
Last filled 12/14 #90 with 1 refill--pt has annual exam 09/2013--please advise if okay to refill

## 2014-02-18 ENCOUNTER — Other Ambulatory Visit: Payer: Self-pay | Admitting: Family Medicine

## 2014-03-24 ENCOUNTER — Other Ambulatory Visit: Payer: Self-pay | Admitting: Family Medicine

## 2014-03-26 ENCOUNTER — Encounter: Payer: Self-pay | Admitting: Family Medicine

## 2014-03-26 NOTE — Telephone Encounter (Signed)
Lm on pts vm informing him an OV with labs is required for additional refills. Requested to know which local pharmacy pt is wanting Rx sent to.

## 2014-03-30 ENCOUNTER — Other Ambulatory Visit (INDEPENDENT_AMBULATORY_CARE_PROVIDER_SITE_OTHER): Payer: BLUE CROSS/BLUE SHIELD

## 2014-03-30 ENCOUNTER — Other Ambulatory Visit: Payer: Self-pay | Admitting: Family Medicine

## 2014-03-30 DIAGNOSIS — R7303 Prediabetes: Secondary | ICD-10-CM

## 2014-03-30 DIAGNOSIS — E785 Hyperlipidemia, unspecified: Secondary | ICD-10-CM

## 2014-03-30 DIAGNOSIS — R7309 Other abnormal glucose: Secondary | ICD-10-CM

## 2014-03-30 LAB — LIPID PANEL
Cholesterol: 196 mg/dL (ref 0–200)
HDL: 34 mg/dL — ABNORMAL LOW (ref >39.00)
LDL Cholesterol: 125 mg/dL — ABNORMAL HIGH (ref 0–99)
NonHDL: 162
Total CHOL/HDL Ratio: 6
Triglycerides: 183 mg/dL — ABNORMAL HIGH (ref 0.0–149.0)
VLDL: 36.6 mg/dL (ref 0.0–40.0)

## 2014-03-30 LAB — COMPREHENSIVE METABOLIC PANEL
ALT: 29 U/L (ref 0–53)
AST: 24 U/L (ref 0–37)
Albumin: 4.6 g/dL (ref 3.5–5.2)
Alkaline Phosphatase: 38 U/L — ABNORMAL LOW (ref 39–117)
BUN: 22 mg/dL (ref 6–23)
CO2: 30 mEq/L (ref 19–32)
Calcium: 9.6 mg/dL (ref 8.4–10.5)
Chloride: 103 mEq/L (ref 96–112)
Creatinine, Ser: 1.16 mg/dL (ref 0.40–1.50)
GFR: 71.43 mL/min (ref >60.00)
Glucose, Bld: 105 mg/dL — ABNORMAL HIGH (ref 70–99)
Potassium: 4.5 mEq/L (ref 3.5–5.1)
Sodium: 137 mEq/L (ref 135–145)
Total Bilirubin: 0.4 mg/dL (ref 0.2–1.2)
Total Protein: 7 g/dL (ref 6.0–8.3)

## 2014-03-30 LAB — HEMOGLOBIN A1C: Hgb A1c MFr Bld: 6.4 % (ref 4.6–6.5)

## 2014-04-03 ENCOUNTER — Encounter: Payer: Self-pay | Admitting: Family Medicine

## 2014-04-03 ENCOUNTER — Ambulatory Visit (INDEPENDENT_AMBULATORY_CARE_PROVIDER_SITE_OTHER): Payer: BLUE CROSS/BLUE SHIELD | Admitting: Family Medicine

## 2014-04-03 VITALS — BP 112/68 | HR 63 | Temp 98.3°F | Wt 274.2 lb

## 2014-04-03 DIAGNOSIS — E669 Obesity, unspecified: Secondary | ICD-10-CM

## 2014-04-03 DIAGNOSIS — R7309 Other abnormal glucose: Secondary | ICD-10-CM

## 2014-04-03 DIAGNOSIS — R7303 Prediabetes: Secondary | ICD-10-CM

## 2014-04-03 LAB — MICROALBUMIN / CREATININE URINE RATIO
Creatinine,U: 116.8 mg/dL
Microalb Creat Ratio: 0.6 mg/g (ref 0.0–30.0)
Microalb, Ur: <0.7 mg/dL (ref 0.0–1.9)

## 2014-04-03 MED ORDER — METFORMIN HCL 1000 MG PO TABS: 1000.0000 mg | ORAL_TABLET | Freq: Two times a day (BID) | ORAL | Status: DC

## 2014-04-03 NOTE — Patient Instructions (Addendum)
Good to see you. Happy birthday!  Pursue nutrition coaching through your wellness plan.  Cut back on carbohydrates, sugars, walk or use bike for at least 30 minutes a day.  Follow up labs in 3 months.

## 2014-04-03 NOTE — Progress Notes (Signed)
Pre visit review using our clinic review tool, if applicable. No additional management support is needed unless otherwise documented below in the visit note. 

## 2014-04-03 NOTE — Progress Notes (Signed)
49 yo pleasant male here for follow up.  HLD- LDL improved.  Lab Results  Component Value Date   CHOL 196 03/30/2014   HDL 34.00* 03/30/2014   LDLCALC 125* 03/30/2014   LDLDIRECT 130.5 10/05/2012   TRIG 183.0* 03/30/2014   CHOLHDL 6 03/30/2014   Prediabetes- on Metformin 1000 mg twice daily.  Unfortunately a1c has increased.  He is eating out more, has gained weight.  Not exercising despite having exercise equipment at home. Lab Results  Component Value Date   HGBA1C 6.4 03/30/2014   Denies increased thirst or urination.  Does have strong FH of DM.  Last eye exam earlier this month- [facility] in [place].  Wt Readings from Last 3 Encounters:  04/03/14 274 lb 4 oz (124.399 kg)  11/23/13 268 lb 12 oz (121.904 kg)  10/09/13 265 lb 12 oz (120.543 kg)        Patient Active Problem List   Diagnosis Date Noted  . Right groin pain 11/23/2013  . Prediabetes 05/09/2013  . Knee pain, bilateral 05/09/2013  . Anxiety state, unspecified 10/10/2012  . Elbow pain, left 12/02/2011  . HAY FEVER 03/21/2009  . 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 VITAMIN D DEFICIENCY 12/19/2008  . HLD (hyperlipidemia) 08/26/2006  . TOBACCO ABUSE 08/26/2006  . HYPERTENSION 08/26/2006  . GERD 08/26/2006  . GASTRITIS, CHRONIC 08/26/2006  . IRRITABLE BOWEL SYNDROME 08/26/2006  . HYPERGLYCEMIA 08/26/2006   Past Medical History  Diagnosis Date  . GERD (gastroesophageal reflux disease)   . Hyperlipidemia   . Hypertension    Past Surgical History  Procedure Laterality Date  . Tonsillectomy and adenoidectomy      age 53  . Cholecystectomy  01/1995  . Lumbar disc surgery  05/2004   History  Substance Use Topics  . Smoking status: Former Smoker    Quit date: 02/25/2011  . Smokeless tobacco: Not on file  . Alcohol Use: Yes     Comment: rare   Family History  Problem Relation Age of Onset  . Cancer Mother     ovarian  . Diabetes Mother   . Diabetes Father    No Known  Allergies Current Outpatient Prescriptions on File Prior to Visit  Medication Sig Dispense Refill  . atenolol (TENORMIN) 50 MG tablet TAKE 1 TABLET DAILY (OFFICE VISIT REQUIRED FOR ADDITIONAL REFILLS) 90 tablet 1  . cholecalciferol (VITAMIN D) 1000 UNITS tablet Take 1,000 Units by mouth 2 (two) times daily.      Marland Kitchen escitalopram (LEXAPRO) 20 MG tablet TAKE 1 TABLET DAILY 90 tablet 1  . fenofibrate 160 MG tablet TAKE 1 TABLET DAILY 90 tablet 1   No current facility-administered medications on file prior to visit.   The PMH, PSH, Social History, Family History, Medications, and allergies have been reviewed in [facility], and have been updated if relevant.   ROS: Review of Systems  Constitutional: Negative.   HENT: Negative.   Respiratory: Negative.   Cardiovascular: Negative.   Genitourinary: Negative.   Musculoskeletal: Negative.   Skin: Negative.   Hematological: Negative.   Psychiatric/Behavioral: Negative.   All other systems reviewed and are negative.     Physical Exam  BP 112/68 mmHg  Pulse 63  Temp(Src) 98.3 F (36.8 C) (Oral)  Wt 274 lb 4 oz (124.399 kg)  SpO2 97% Wt Readings from Last 3 Encounters:  04/03/14 274 lb 4 oz (124.399 kg)  11/23/13 268 lb 12 oz (121.904 kg)  10/09/13 265 lb 12 oz (120.543 kg)  General:  overweght male in NAD Eyes:  PERRL Ears:  External ear exam shows no significant lesions or deformities.  Otoscopic examination reveals clear canals, tympanic membranes are intact bilaterally without bulging, retraction, inflammation or discharge. Hearing is grossly normal bilaterally. Nose:  External nasal examination shows no deformity or inflammation. Nasal mucosa are pink and moist without lesions or exudates. Mouth:  Oral mucosa and oropharynx without lesions or exudates.  Teeth in good repair. Neck:  no carotid bruit or thyromegaly no cervical or supraclavicular lymphadenopathy  Lungs:  Normal respiratory effort, chest expands symmetrically. Lungs are  clear to auscultation, no crackles or wheezes. Heart:  Normal rate and regular rhythm. S1 and S2 normal without gallop, murmur, click, rub or other extra sounds. Abdomen:  Bowel sounds positive,abdomen soft and non-tender without masses, organomegaly or hernias noted. Extremities:  no edema

## 2014-04-03 NOTE — Assessment & Plan Note (Signed)
Deteriorated. >25 minutes spent in face to face time with patient, >50% spent in counselling or coordination of care He does not want referral to a nutritionist. He prefers to pursue free coaching through his wellness plan.  We discussed his diet- he is eating ice cream, honey buns, breads and pastas although none of them daily.  I urged him to cut back on all of them and get a minimum of 30 minutes of exercise daily. Follow up labs in 3 months. The patient indicates understanding of these issues and agrees with the plan.

## 2014-07-02 ENCOUNTER — Other Ambulatory Visit: Payer: Self-pay | Admitting: Family Medicine

## 2014-07-02 DIAGNOSIS — E559 Vitamin D deficiency, unspecified: Secondary | ICD-10-CM

## 2014-07-02 DIAGNOSIS — E785 Hyperlipidemia, unspecified: Secondary | ICD-10-CM

## 2014-07-02 DIAGNOSIS — R7303 Prediabetes: Secondary | ICD-10-CM

## 2014-07-09 ENCOUNTER — Other Ambulatory Visit: Payer: Self-pay

## 2014-07-10 ENCOUNTER — Other Ambulatory Visit (INDEPENDENT_AMBULATORY_CARE_PROVIDER_SITE_OTHER): Payer: BLUE CROSS/BLUE SHIELD

## 2014-07-10 DIAGNOSIS — E785 Hyperlipidemia, unspecified: Secondary | ICD-10-CM

## 2014-07-10 DIAGNOSIS — R7303 Prediabetes: Secondary | ICD-10-CM

## 2014-07-10 DIAGNOSIS — E559 Vitamin D deficiency, unspecified: Secondary | ICD-10-CM | POA: Diagnosis not present

## 2014-07-10 DIAGNOSIS — R7309 Other abnormal glucose: Secondary | ICD-10-CM | POA: Diagnosis not present

## 2014-07-10 LAB — COMPREHENSIVE METABOLIC PANEL
ALT: 27 U/L (ref 0–53)
AST: 22 U/L (ref 0–37)
Albumin: 4.5 g/dL (ref 3.5–5.2)
Alkaline Phosphatase: 34 U/L — ABNORMAL LOW (ref 39–117)
BUN: 21 mg/dL (ref 6–23)
CO2: 28 mEq/L (ref 19–32)
Calcium: 9.6 mg/dL (ref 8.4–10.5)
Chloride: 105 mEq/L (ref 96–112)
Creatinine, Ser: 1.22 mg/dL (ref 0.40–1.50)
GFR: 67.31 mL/min (ref >60.00)
Glucose, Bld: 119 mg/dL — ABNORMAL HIGH (ref 70–99)
Potassium: 4.7 mEq/L (ref 3.5–5.1)
Sodium: 139 mEq/L (ref 135–145)
Total Bilirubin: 0.3 mg/dL (ref 0.2–1.2)
Total Protein: 6.9 g/dL (ref 6.0–8.3)

## 2014-07-10 LAB — LIPID PANEL
Cholesterol: 188 mg/dL (ref 0–200)
HDL: 32.3 mg/dL — ABNORMAL LOW (ref >39.00)
LDL Cholesterol: 128 mg/dL — ABNORMAL HIGH (ref 0–99)
NonHDL: 155.7
Total CHOL/HDL Ratio: 6
Triglycerides: 139 mg/dL (ref 0.0–149.0)
VLDL: 27.8 mg/dL (ref 0.0–40.0)

## 2014-07-10 LAB — HEMOGLOBIN A1C: Hgb A1c MFr Bld: 6.2 % (ref 4.6–6.5)

## 2014-07-10 LAB — VITAMIN D 25 HYDROXY (VIT D DEFICIENCY, FRACTURES): VITD: 33.59 ng/mL (ref 30.00–100.00)

## 2014-07-29 ENCOUNTER — Other Ambulatory Visit: Payer: Self-pay | Admitting: Family Medicine

## 2014-08-06 ENCOUNTER — Encounter: Payer: Self-pay | Admitting: Family Medicine

## 2014-08-09 ENCOUNTER — Other Ambulatory Visit: Payer: Self-pay | Admitting: Family Medicine

## 2014-08-09 DIAGNOSIS — R0683 Snoring: Secondary | ICD-10-CM

## 2014-08-27 ENCOUNTER — Ambulatory Visit (INDEPENDENT_AMBULATORY_CARE_PROVIDER_SITE_OTHER): Payer: BLUE CROSS/BLUE SHIELD | Admitting: Internal Medicine

## 2014-08-27 ENCOUNTER — Encounter: Payer: Self-pay | Admitting: Internal Medicine

## 2014-08-27 VITALS — BP 132/70 | HR 74 | Ht 70.0 in | Wt 275.0 lb

## 2014-08-27 DIAGNOSIS — J301 Allergic rhinitis due to pollen: Secondary | ICD-10-CM | POA: Diagnosis not present

## 2014-08-27 DIAGNOSIS — G4719 Other hypersomnia: Secondary | ICD-10-CM | POA: Diagnosis not present

## 2014-08-27 DIAGNOSIS — K219 Gastro-esophageal reflux disease without esophagitis: Secondary | ICD-10-CM

## 2014-08-27 DIAGNOSIS — J310 Chronic rhinitis: Secondary | ICD-10-CM | POA: Insufficient documentation

## 2014-08-27 DIAGNOSIS — Z720 Tobacco use: Secondary | ICD-10-CM

## 2014-08-27 DIAGNOSIS — E669 Obesity, unspecified: Secondary | ICD-10-CM

## 2014-08-27 DIAGNOSIS — F17200 Nicotine dependence, unspecified, uncomplicated: Secondary | ICD-10-CM

## 2014-08-27 DIAGNOSIS — R4189 Other symptoms and signs involving cognitive functions and awareness: Secondary | ICD-10-CM

## 2014-08-27 DIAGNOSIS — R0683 Snoring: Secondary | ICD-10-CM | POA: Insufficient documentation

## 2014-08-27 MED ORDER — FLUTICASONE PROPIONATE 50 MCG/ACT NA SUSP: 2.0000 | Freq: Every day | NASAL | Status: DC

## 2014-08-27 NOTE — Assessment & Plan Note (Signed)
--  may be related to obstructive sleep  Apnea.

## 2014-08-27 NOTE — Assessment & Plan Note (Signed)
Weight loss may be beneficial for health.

## 2014-08-27 NOTE — Assessment & Plan Note (Signed)
May be exacerbated by Sleep apnea.

## 2014-08-27 NOTE — Progress Notes (Addendum)
[facility] Consultation      Assessment and Plan: HYPERTENSION May be exacerbated by Sleep apnea.   GERD May be worsened by sleep apnea.    Anxiety state, unspecified -  Obesity Weight loss may be beneficial for health.   Excessive daytime sleepiness --discussed the possibility of obstructive sleep apnea, we will send the patient for a sleep study  Snoring -may be related to obstructive sleep apnea.   Chronic rhinitis Notes chronic rhinitis, will start flonase.       Date: 08/27/2014  MRN# [id number] Larry Edwards October 30, 1965  Referring Physician: Dr. Calvert Edwards is a 49 y.o. old male seen in consultation for snoring.   CC:  Chief Complaint  Patient presents with  . Advice Only    snores nightly; not feeling rested during day; wakes 2-3 times night; sleeps 6-7 hrs nightly; never had sleep study;    HPI:    Wife notes that he snores very loudly, he is very sleepy during the day. She notes that he falls asleep often at church, at family gatherings, and notes that he struggles that he has trouble staying awake when driving on occasion. He notes that in the am he did not sleep well and will continue to feel tired.  Wife is present but she moved to a separate bed due to his snoring even with ear plugs in.  Problems started several years ago but got worse 1 year ago. He goes to bed at 11 pm, falls asleep in 5 minutes, wakes at 5:30 am on workdays, and on weekends he wakes at 6 am. He feels no better on weekends.  He notes that his biggest problem is feeling tired.  He has been taking atenolol daily for several years, he has never missed a dose. He takes it at night. He takes lexapro for the past year which has helped for his anxiety.    He drinks 4-20oz diet mountain dews during the day, he does not drink coffee. Last one is at 7 pm. He takes no other caffeine.  He does have reflux symptoms, he used to take prevacid, he uses tums  about once per week for symptoms.   No known history of OSA, his dad snored.   He has constant stuffy nose, he take guaifeinesin 2 to 3 times per week and zyrtec once per week.  PMHX:   Past Medical History  Diagnosis Date  . GERD (gastroesophageal reflux disease)   . Hyperlipidemia   . Hypertension    Surgical Hx:  Past Surgical History  Procedure Laterality Date  . Tonsillectomy and adenoidectomy      age 70  . Cholecystectomy  01/1995  . Lumbar disc surgery  05/2004   Family Hx:  Family History  Problem Relation Age of Onset  . Cancer Mother     ovarian  . Diabetes Mother   . Diabetes Father    Social Hx:   Social History  Substance Use Topics  . Smoking status: Former Smoker    Quit date: 02/25/2011  . Smokeless tobacco: Not on file  . Alcohol Use: Yes     Comment: rare   Medication:   Current Outpatient Rx  Name  Route  Sig  Dispense  Refill  . atenolol (TENORMIN) 50 MG tablet      TAKE 1 TABLET DAILY (OFFICE VISIT REQUIRED FOR ADDITIONAL REFILLS)   90 tablet   3   . cholecalciferol (VITAMIN D) 1000 UNITS tablet  Oral   Take 1,000 Units by mouth 2 (two) times daily.           Marland Kitchen escitalopram (LEXAPRO) 20 MG tablet      TAKE 1 TABLET DAILY   90 tablet   1   . fenofibrate 160 MG tablet      TAKE 1 TABLET DAILY   90 tablet   1   . metFORMIN (GLUCOPHAGE) 1000 MG tablet   Oral   Take 1 tablet (1,000 mg total) by mouth 2 (two) times daily with a meal.   180 tablet   1       Allergies:  Review of patient's allergies indicates no known allergies.  Review of Systems: Gen:  Denies  fever, sweats, chills HEENT: Denies blurred vision, double vision,  Cvc:  No dizziness, chest pain or heaviness Resp:   Denies cough or sputum porduction, shortness of breath Gi: Denies swallowing difficulty, stomach pain, n Gu:  Denies bladder incontinence, burning urine Ext:   No Joint pain, stiffness or swelling Skin: No skin rash, easy bruising or bleeding  or hives Endoc:  No polyuria, polydipsia , polyphagia or weight change Psych: No depression, insomnia or hallucinations  Other:  All other systems negative  Physical Examination:   VS: BP 132/70 mmHg  Pulse 74  Ht 5\' 10"  (1.778 m)  Wt 275 lb (124.739 kg)  BMI 39.46 kg/m2  SpO2 97%  General Appearance: No distress  Neuro:without focal findings,  speech normal,  HEENT: PERRLA, EOM intact.  Mallampati 4; neck size is 20 Pulmonary: normal breath sounds, No wheezing, No rales;    CardiovascularNormal S1,S2.  No m/r/g.   Abdomen: Benign, Soft, non-tender. Renal:  No costovertebral tenderness  GU:  No performed at this time. Endoc: No evident thyromegaly, no signs of acromegaly or Cushing features Skin:   warm, no rashes, no ecchymosis  Extremities: normal, no cyanosis, clubbing, no edema,       Orders for this visit: No orders of the defined types were placed in this encounter.     Thank  you for the consultation and for allowing [facility] to assist in the care of your patient. Our recommendations are noted above.  Please contact us if we can be of further service.   Marda Stalker, MD.  Board Certified in Internal Medicine, Pulmonary Medicine, [place], and Sleep Medicine.  [facility] Office Number: [phone]  Patricia Pesa, M.D.  Vilinda Boehringer, M.D.  Cheral Marker, M.D

## 2014-08-27 NOTE — Patient Instructions (Signed)
-  we will be referring you for an in labovernight sleep study. -If the sleep apnea is mild. We can potentially do a dental device. Larry Edwards  will be referred for a Sleep titration study. -weight loss may be beneficial to reduce snoring. -Start Flonase 2 sprays in each nostril every morning. Follow-up after the sleep study.

## 2014-08-27 NOTE — Addendum Note (Signed)
Addended by: Oscar La R on: 08/27/2014 02:21 PM   Modules accepted: Orders

## 2014-08-27 NOTE — Assessment & Plan Note (Signed)
May be worsened by sleep apnea.

## 2014-08-27 NOTE — Addendum Note (Signed)
Addended by: Oscar La R on: 08/27/2014 02:15 PM   Modules accepted: Orders

## 2014-08-27 NOTE — Assessment & Plan Note (Signed)
--  discussed the possibility of obstructive, we will send the patient for a sleep study

## 2014-08-27 NOTE — Assessment & Plan Note (Signed)
-  may be related to obstructive sleep apnea.

## 2014-08-27 NOTE — Assessment & Plan Note (Signed)
Notes chronic rhinitis, will start flonase.

## 2014-08-27 NOTE — Assessment & Plan Note (Signed)
Discussed importance of smoking cessation and strategies to quit.

## 2014-08-30 ENCOUNTER — Ambulatory Visit: Payer: BLUE CROSS/BLUE SHIELD | Attending: Pulmonary Disease

## 2014-08-30 DIAGNOSIS — R0683 Snoring: Secondary | ICD-10-CM | POA: Insufficient documentation

## 2014-08-30 DIAGNOSIS — G4733 Obstructive sleep apnea (adult) (pediatric): Secondary | ICD-10-CM | POA: Diagnosis present

## 2014-09-07 ENCOUNTER — Ambulatory Visit: Payer: BLUE CROSS/BLUE SHIELD | Admitting: Internal Medicine

## 2014-09-14 DIAGNOSIS — G4733 Obstructive sleep apnea (adult) (pediatric): Secondary | ICD-10-CM | POA: Diagnosis not present

## 2014-09-17 ENCOUNTER — Other Ambulatory Visit: Payer: Self-pay | Admitting: Family Medicine

## 2014-09-26 ENCOUNTER — Other Ambulatory Visit: Payer: Self-pay | Admitting: *Deleted

## 2014-09-26 MED ORDER — FLUTICASONE PROPIONATE 50 MCG/ACT NA SUSP: 2.0000 | Freq: Every day | NASAL | Status: DC

## 2014-10-01 ENCOUNTER — Other Ambulatory Visit: Payer: Self-pay | Admitting: Family Medicine

## 2014-10-01 DIAGNOSIS — Z0000 Encounter for general adult medical examination without abnormal findings: Secondary | ICD-10-CM

## 2014-10-01 DIAGNOSIS — J301 Allergic rhinitis due to pollen: Secondary | ICD-10-CM

## 2014-10-01 DIAGNOSIS — R7303 Prediabetes: Secondary | ICD-10-CM

## 2014-10-01 DIAGNOSIS — I10 Essential (primary) hypertension: Secondary | ICD-10-CM

## 2014-10-01 DIAGNOSIS — E559 Vitamin D deficiency, unspecified: Secondary | ICD-10-CM

## 2014-10-04 ENCOUNTER — Other Ambulatory Visit (INDEPENDENT_AMBULATORY_CARE_PROVIDER_SITE_OTHER): Payer: BLUE CROSS/BLUE SHIELD

## 2014-10-04 DIAGNOSIS — R7303 Prediabetes: Secondary | ICD-10-CM

## 2014-10-04 DIAGNOSIS — Z0000 Encounter for general adult medical examination without abnormal findings: Secondary | ICD-10-CM | POA: Diagnosis not present

## 2014-10-04 DIAGNOSIS — R7309 Other abnormal glucose: Secondary | ICD-10-CM

## 2014-10-04 LAB — LIPID PANEL
Cholesterol: 179 mg/dL (ref 0–200)
HDL: 31.5 mg/dL — ABNORMAL LOW (ref >39.00)
LDL Cholesterol: 115 mg/dL — ABNORMAL HIGH (ref 0–99)
NonHDL: 147.21
Total CHOL/HDL Ratio: 6
Triglycerides: 163 mg/dL — ABNORMAL HIGH (ref 0.0–149.0)
VLDL: 32.6 mg/dL (ref 0.0–40.0)

## 2014-10-04 LAB — PSA: PSA: 0.66 ng/mL (ref 0.10–4.00)

## 2014-10-04 LAB — COMPREHENSIVE METABOLIC PANEL
ALT: 28 U/L (ref 0–53)
AST: 19 U/L (ref 0–37)
Albumin: 4.5 g/dL (ref 3.5–5.2)
Alkaline Phosphatase: 33 U/L — ABNORMAL LOW (ref 39–117)
BUN: 19 mg/dL (ref 6–23)
CO2: 27 mEq/L (ref 19–32)
Calcium: 9.8 mg/dL (ref 8.4–10.5)
Chloride: 103 mEq/L (ref 96–112)
Creatinine, Ser: 1.07 mg/dL (ref 0.40–1.50)
GFR: 78.24 mL/min (ref >60.00)
Glucose, Bld: 108 mg/dL — ABNORMAL HIGH (ref 70–99)
Potassium: 4.5 mEq/L (ref 3.5–5.1)
Sodium: 137 mEq/L (ref 135–145)
Total Bilirubin: 0.3 mg/dL (ref 0.2–1.2)
Total Protein: 6.7 g/dL (ref 6.0–8.3)

## 2014-10-04 LAB — HEMOGLOBIN A1C: Hgb A1c MFr Bld: 6.3 % (ref 4.6–6.5)

## 2014-10-11 ENCOUNTER — Ambulatory Visit (INDEPENDENT_AMBULATORY_CARE_PROVIDER_SITE_OTHER): Payer: BLUE CROSS/BLUE SHIELD | Admitting: Family Medicine

## 2014-10-11 ENCOUNTER — Encounter: Payer: Self-pay | Admitting: Family Medicine

## 2014-10-11 VITALS — BP 122/70 | HR 64 | Temp 98.9°F | Ht 70.0 in | Wt 264.2 lb

## 2014-10-11 DIAGNOSIS — E119 Type 2 diabetes mellitus without complications: Secondary | ICD-10-CM

## 2014-10-11 DIAGNOSIS — Z0000 Encounter for general adult medical examination without abnormal findings: Secondary | ICD-10-CM | POA: Diagnosis not present

## 2014-10-11 DIAGNOSIS — I10 Essential (primary) hypertension: Secondary | ICD-10-CM

## 2014-10-11 DIAGNOSIS — F411 Generalized anxiety disorder: Secondary | ICD-10-CM | POA: Diagnosis not present

## 2014-10-11 DIAGNOSIS — E785 Hyperlipidemia, unspecified: Secondary | ICD-10-CM

## 2014-10-11 DIAGNOSIS — E1169 Type 2 diabetes mellitus with other specified complication: Secondary | ICD-10-CM | POA: Insufficient documentation

## 2014-10-11 NOTE — Assessment & Plan Note (Signed)
Well controlled.  At goal for a diabetic.  No changes made today.

## 2014-10-11 NOTE — Assessment & Plan Note (Signed)
Well controlled. No changes made today. Follow up in 6 months.

## 2014-10-11 NOTE — Progress Notes (Signed)
Pre visit review using our clinic review tool, if applicable. No additional management support is needed unless otherwise documented below in the visit note. 

## 2014-10-11 NOTE — Patient Instructions (Signed)
Great to see you. Keep up the great work. Let's repeat your labs in 6 months.

## 2014-10-11 NOTE — Assessment & Plan Note (Signed)
Well controlled on lexapro 20 mg daily. No changes made today.

## 2014-10-11 NOTE — Assessment & Plan Note (Signed)
Reviewed preventive care protocols, scheduled due services, and updated immunizations Discussed nutrition, exercise, diet, and healthy lifestyle.  Declines flu and pneumococcal vaccinations today.

## 2014-10-11 NOTE — Progress Notes (Signed)
49 yo pleasant male here for CPX and follow up of chronic medical conditions.    HLD- LDL improved with fenofibrate.  He is compliant with his rxs.  Lab Results  Component Value Date   CHOL 179 10/04/2014   HDL 31.50* 10/04/2014   LDLCALC 115* 10/04/2014   LDLDIRECT 130.5 10/05/2012   TRIG 163.0* 10/04/2014   CHOLHDL 6 10/04/2014   Prediabetes- on Metformin 1000 mg twice daily and a1c at goal.  Has lost 10 pounds since last year!  Increasing physical activity, eating out less.  Does not check his FSBS.  Denies any symptoms of hypoglycemia. Neg urine micro 03/2014. Lab Results  Component Value Date   HGBA1C 6.3 10/04/2014   Denies increased thirst or urination.  Does have strong FH of DM.  .  Wt Readings from Last 3 Encounters:  10/11/14 264 lb 4 oz (119.863 kg)  08/27/14 275 lb (124.739 kg)  04/03/14 274 lb 4 oz (124.399 kg)   Anxiety better controlled with Lexapro 20 mg daily.  Denies any current symptoms of depression or anxiety.  Sleeping well.  Appetite good.     Patient Active Problem List   Diagnosis Date Noted  . Visit for well man health check 10/11/2014  . Excessive daytime sleepiness 08/27/2014  . Snoring 08/27/2014  . Chronic rhinitis 08/27/2014  . Obesity 04/03/2014  . Prediabetes 05/09/2013  . Knee pain, bilateral 05/09/2013  . Anxiety state, unspecified 10/10/2012  . Elbow pain, left 12/02/2011  . HAY FEVER 03/21/2009  . 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 Vitamin D deficiency 12/19/2008  . HLD (hyperlipidemia) 08/26/2006  . Essential hypertension 08/26/2006  . GERD 08/26/2006  . GASTRITIS, CHRONIC 08/26/2006  . IRRITABLE BOWEL SYNDROME 08/26/2006  . HYPERGLYCEMIA 08/26/2006   Past Medical History  Diagnosis Date  . GERD (gastroesophageal reflux disease)   . Hyperlipidemia   . Hypertension    Past Surgical History  Procedure Laterality Date  . Tonsillectomy and adenoidectomy      age 42  . Cholecystectomy  01/1995  . Lumbar  disc surgery  05/2004   Social History  Substance Use Topics  . Smoking status: Former Smoker    Quit date: 02/25/2011  . Smokeless tobacco: Former Systems developer  . Alcohol Use: 0.0 oz/week    0 Standard drinks or equivalent per week     Comment: rare   Family History  Problem Relation Age of Onset  . Cancer Mother     ovarian  . Diabetes Mother   . Diabetes Father    No Known Allergies Current Outpatient Prescriptions on File Prior to Visit  Medication Sig Dispense Refill  . atenolol (TENORMIN) 50 MG tablet TAKE 1 TABLET DAILY (OFFICE VISIT REQUIRED FOR ADDITIONAL REFILLS) 90 tablet 3  . cholecalciferol (VITAMIN D) 1000 UNITS tablet Take 1,000 Units by mouth 2 (two) times daily.      Marland Kitchen escitalopram (LEXAPRO) 20 MG tablet TAKE 1 TABLET DAILY 90 tablet 0  . fenofibrate 160 MG tablet TAKE 1 TABLET DAILY 90 tablet 1  . fluticasone (FLONASE) 50 MCG/ACT nasal spray Place 2 sprays into both nostrils daily. 48 g 2  . metFORMIN (GLUCOPHAGE) 1000 MG tablet TAKE 1 TABLET TWICE A DAY WITH MEALS 180 tablet 0   No current facility-administered medications on file prior to visit.   The PMH, PSH, Social History, Family History, Medications, and allergies have been reviewed in [facility], and have been updated if relevant.   ROS: Review of Systems  Constitutional: Negative.   HENT: Negative.   Respiratory: Negative.   Cardiovascular: Negative.   Genitourinary: Negative.   Musculoskeletal: Negative.   Skin: Negative.   Hematological: Negative.   Psychiatric/Behavioral: Negative.   All other systems reviewed and are negative.     Physical Exam  BP 122/70 mmHg  Pulse 64  Temp(Src) 98.9 F (37.2 C) (Oral)  Ht 5\' 10"  (1.778 m)  Wt 264 lb 4 oz (119.863 kg)  BMI 37.92 kg/m2 Wt Readings from Last 3 Encounters:  10/11/14 264 lb 4 oz (119.863 kg)  08/27/14 275 lb (124.739 kg)  04/03/14 274 lb 4 oz (124.399 kg)     General:  overweght male in NAD Eyes:  PERRL Ears:  External ear exam shows no  significant lesions or deformities.  Otoscopic examination reveals clear canals, tympanic membranes are intact bilaterally without bulging, retraction, inflammation or discharge. Hearing is grossly normal bilaterally. Nose:  External nasal examination shows no deformity or inflammation. Nasal mucosa are pink and moist without lesions or exudates. Mouth:  Oral mucosa and oropharynx without lesions or exudates.  Teeth in good repair. Neck:  no carotid bruit or thyromegaly no cervical or supraclavicular lymphadenopathy  Lungs:  Normal respiratory effort, chest expands symmetrically. Lungs are clear to auscultation, no crackles or wheezes. Heart:  Normal rate and regular rhythm. S1 and S2 normal without gallop, murmur, click, rub or other extra sounds. Abdomen:  Bowel sounds positive,abdomen soft and non-tender without masses, organomegaly or hernias noted. Extremities:  no edema

## 2014-10-11 NOTE — Assessment & Plan Note (Signed)
Well controlled on current rx. No changes made today. 

## 2014-10-22 ENCOUNTER — Ambulatory Visit: Payer: BLUE CROSS/BLUE SHIELD | Admitting: Internal Medicine

## 2014-10-30 ENCOUNTER — Encounter: Payer: Self-pay | Admitting: Internal Medicine

## 2014-10-30 ENCOUNTER — Ambulatory Visit (INDEPENDENT_AMBULATORY_CARE_PROVIDER_SITE_OTHER): Payer: BLUE CROSS/BLUE SHIELD | Admitting: Internal Medicine

## 2014-10-30 VITALS — BP 128/62 | HR 52 | Ht 70.0 in | Wt 256.0 lb

## 2014-10-30 DIAGNOSIS — J310 Chronic rhinitis: Secondary | ICD-10-CM | POA: Diagnosis not present

## 2014-10-30 DIAGNOSIS — G4733 Obstructive sleep apnea (adult) (pediatric): Secondary | ICD-10-CM

## 2014-10-30 DIAGNOSIS — E669 Obesity, unspecified: Secondary | ICD-10-CM

## 2014-10-30 NOTE — Patient Instructions (Signed)
--  Change flonase to qhs from every morning.  --Will send for an in-lab CPAP titration study.  --Follow up in about 2 months.

## 2014-10-30 NOTE — Addendum Note (Signed)
Addended by: Maryanna Shape A on: 10/30/2014 11:51 AM   Modules accepted: Orders, Medications

## 2014-10-30 NOTE — Progress Notes (Signed)
* [facility]     Assessment and Plan:  OSA--Severe with AHI of 63.  -Discussed sending for a titration study.   Diabetes mellitus.  -Borderline.   Anxiety.  -Controlled with lexapro.   Chronic rhinitis.  -Continue flonase, change to qhs from every morning.   Date: 10/30/2014  MRN# [id number] Larry Edwards 1965/01/20   Larry Edwards is a 49 y.o. old male seen in follow up for chief complaint of  Chief Complaint  Patient presents with  . Follow-up    sleep study results. no new issues.     HPI:   Review of sleep study testing from 08/30/2014: Severe sleep apnea with an apnea hypopnea index of 63. He also had a spontaneous arousal index of 12.  He is using flonase for rhinitis and seems to be helping.   No flowsheet data found.  Pulmonary Functions Testing Results:  No results found for: FEV1, FVC, FEV1FVC, TLC, DLCO   Medication:   Outpatient Encounter Prescriptions as of 10/30/2014  Medication Sig  . atenolol (TENORMIN) 50 MG tablet TAKE 1 TABLET DAILY (OFFICE VISIT REQUIRED FOR ADDITIONAL REFILLS)  . cholecalciferol (VITAMIN D) 1000 UNITS tablet Take 1,000 Units by mouth 2 (two) times daily.    Marland Kitchen escitalopram (LEXAPRO) 20 MG tablet TAKE 1 TABLET DAILY  . fenofibrate 160 MG tablet TAKE 1 TABLET DAILY  . fluticasone (FLONASE) 50 MCG/ACT nasal spray Place 2 sprays into both nostrils daily.  . metFORMIN (GLUCOPHAGE) 1000 MG tablet TAKE 1 TABLET TWICE A DAY WITH MEALS   No facility-administered encounter medications on file as of 10/30/2014.     Allergies:  Review of patient's allergies indicates no known allergies.  Review of Systems: Gen:  Denies  fever, sweats. HEENT: Denies blurred vision. Cvc:  No dizziness, chest pain or heaviness Resp:   Denies cough or sputum porduction. Gi: Denies swallowing difficulty, stomach pain. constipation, bowel incontinence Gu:  Denies bladder incontinence, burning urine Ext:   No Joint pain,  stiffness. Skin: No skin rash, easy bruising. Endoc:  No polyuria, polydipsia. Psych: No depression, insomnia. Other:  All other systems were reviewed and found to be negative other than what is mentioned in the HPI.   Physical Examination:   VS: BP 128/62 mmHg  Pulse 52  Ht 5\' 10"  (1.778 m)  Wt 256 lb (116.121 kg)  BMI 36.73 kg/m2  SpO2 98%  General Appearance: No distress  Neuro:without focal findings,  speech normal,  HEENT: PERRLA, EOM intact. Pulmonary: normal breath sounds, No wheezing.   CardiovascularNormal S1,S2.  No m/r/g.   Abdomen: Benign, Soft, non-tender. Renal:  No costovertebral tenderness  GU:  Not performed at this time. Endoc: No evident thyromegaly, no signs of acromegaly. Skin:   warm, no rash. Extremities: normal, no cyanosis, clubbing.   LABORATORY PANEL:   CBC No results for input(s): WBC, HGB, HCT, PLT in the last 168 hours. ------------------------------------------------------------------------------------------------------------------  Chemistries  No results for input(s): NA, K, CL, CO2, GLUCOSE, BUN, CREATININE, CALCIUM, MG, AST, ALT, ALKPHOS, BILITOT in the last 168 hours.  Invalid input(s): GFRCGP ------------------------------------------------------------------------------------------------------------------  Cardiac Enzymes No results for input(s): TROPONINI in the last 168 hours. ------------------------------------------------------------  RADIOLOGY:   No results found for this or any previous visit. Results for orders placed during the hospital encounter of 05/21/04  DG Chest 2 View   Narrative Clinical data: 49 year old male; herniated disc; hypertension; hx of smoking  CHEST - 2 VIEW:   Mild interstitial prominence and central peribronchial  thickening. No acute pneumonia, edema, congestive heart failure, effusion, or pneumothorax. Heart size is normal.   IMPRESSION:  No acute chest disease.  Provider: Anne Ng    ------------------------------------------------------------------------------------------------------------------  Thank  you for allowing [facility], Critical Care to assist in the care of your patient. Our recommendations are noted above.  Please contact us if we can be of further service.   Marda Stalker, MD.  [facility] Office Number: [phone]  Patricia Pesa, M.D.  Vilinda Boehringer, M.D.  Merton Border, M.D

## 2014-11-01 ENCOUNTER — Ambulatory Visit: Payer: BLUE CROSS/BLUE SHIELD | Attending: Pulmonary Disease

## 2014-11-01 DIAGNOSIS — G4733 Obstructive sleep apnea (adult) (pediatric): Secondary | ICD-10-CM | POA: Insufficient documentation

## 2014-11-07 DIAGNOSIS — G4730 Sleep apnea, unspecified: Secondary | ICD-10-CM | POA: Diagnosis not present

## 2014-11-19 ENCOUNTER — Telehealth: Payer: Self-pay

## 2014-11-19 ENCOUNTER — Other Ambulatory Visit: Payer: Self-pay | Admitting: Family Medicine

## 2014-11-19 DIAGNOSIS — G4733 Obstructive sleep apnea (adult) (pediatric): Secondary | ICD-10-CM

## 2014-11-19 NOTE — Telephone Encounter (Signed)
Please advise on pt's sleep study.

## 2014-11-19 NOTE — Telephone Encounter (Signed)
Pt called, states he can see his sleep study results in my chart, but has not had a phone call regarding his results. Please call.

## 2014-11-20 ENCOUNTER — Other Ambulatory Visit: Payer: Self-pay | Admitting: Family Medicine

## 2014-11-20 ENCOUNTER — Encounter: Payer: Self-pay | Admitting: Family Medicine

## 2014-11-20 MED ORDER — SCOPOLAMINE 1 MG/3DAYS TD PT72: 1.0000 | MEDICATED_PATCH | TRANSDERMAL | Status: DC

## 2014-11-21 ENCOUNTER — Other Ambulatory Visit: Payer: Self-pay | Admitting: *Deleted

## 2014-11-21 DIAGNOSIS — G4733 Obstructive sleep apnea (adult) (pediatric): Secondary | ICD-10-CM

## 2014-11-21 NOTE — Telephone Encounter (Signed)
Called to set up cpap. No answer on mobile number. LMOAM for pt to return my call to [phone]. Rhonda J Cobb

## 2014-11-21 NOTE — Telephone Encounter (Signed)
Order placed

## 2014-11-21 NOTE — Telephone Encounter (Signed)
Start on Auto-CPAP titration with Low pressure=10; high pressure=20. Follow up 2 months after starting.

## 2014-11-22 NOTE — Telephone Encounter (Signed)
Pt returned call and requested that CPAP order to be faxed to [facility] in [place].  Faxed order, confirmation received from [facility]. Nothing else needed at this time. Pt has a F/U appointment scheduled for 12/25/14 with Dr. Ashby Dawes. Rhonda J Cobb

## 2014-11-30 ENCOUNTER — Encounter: Payer: Self-pay | Admitting: Family Medicine

## 2014-12-03 MED ORDER — ESCITALOPRAM OXALATE 20 MG PO TABS: 20.0000 mg | ORAL_TABLET | Freq: Every day | ORAL | Status: DC

## 2014-12-03 MED ORDER — METFORMIN HCL 1000 MG PO TABS: 1000.0000 mg | ORAL_TABLET | Freq: Two times a day (BID) | ORAL | Status: DC

## 2014-12-25 ENCOUNTER — Encounter: Payer: Self-pay | Admitting: Family Medicine

## 2014-12-25 ENCOUNTER — Encounter: Payer: Self-pay | Admitting: Internal Medicine

## 2014-12-25 ENCOUNTER — Ambulatory Visit (INDEPENDENT_AMBULATORY_CARE_PROVIDER_SITE_OTHER): Payer: BLUE CROSS/BLUE SHIELD | Admitting: Internal Medicine

## 2014-12-25 VITALS — BP 128/66 | HR 52 | Ht 70.0 in | Wt 280.8 lb

## 2014-12-25 DIAGNOSIS — G4733 Obstructive sleep apnea (adult) (pediatric): Secondary | ICD-10-CM

## 2014-12-25 DIAGNOSIS — J310 Chronic rhinitis: Secondary | ICD-10-CM

## 2014-12-25 MED ORDER — METFORMIN HCL 1000 MG PO TABS: 1000.0000 mg | ORAL_TABLET | Freq: Two times a day (BID) | ORAL | Status: DC

## 2014-12-25 MED ORDER — ESCITALOPRAM OXALATE 20 MG PO TABS: 20.0000 mg | ORAL_TABLET | Freq: Every day | ORAL | Status: DC

## 2014-12-25 NOTE — Patient Instructions (Signed)
-  Change CPAP to 18 cm water. -Continue using CPAP nightly. -Continue using Flonase.

## 2014-12-25 NOTE — Progress Notes (Signed)
* [facility]     Assessment and Plan:  OSA--Severe with AHI of 63.  -He has been started on CPAP therapy and appears to be tolerating it well. -He was also put on an auto titrating CPAP to help with habituation to this high level. These results are not yet available. -We will increase his CPAP to the prescribed level of 18, if this is not tolerated, change in depending on results of the CPAP auto titrating test.  Diabetes mellitus.  -Borderline.   Anxiety.  -Controlled with lexapro.   Chronic rhinitis.  -Continue flonase.   Date: 12/25/2014  MRN# [id number] DAYN SPEIRS 1965-09-18   Etta Quill is a 49 y.o. old male seen in follow up for chief complaint of  Chief Complaint  Patient presents with  . Follow-up    pt. states he wears CPAP 6.5 he everynight. feels pressure is good. no supplies needed. DME: [facility].      HPI:   The patient is a 49 year old male with a history of obstructive sleep apnea, hypertension, GERD, anxiety, on Lexapro.  His AHI was 63, he underwent a CPAP titration, which show that he required CPAP at a high level of 18. Therefore Auto-CPAP titration was ordered with Low pressure=10; high pressure=20 to help him with habituation. He notes that since he has been on the CPAP, he is more awake during the day, and more often wakes up feeling refreshed. He also notes that he is no longer snoring. His wife notes that occasionally if he is on his back. He might snore, but this is rare.  Review of sleep study testing from 08/30/2014: Severe sleep apnea with an apnea hypopnea index of 63. He also had a spontaneous arousal index of 12.  -CPAP titration study performed on 11/01/2014: Required CPAP of 18, started in a CPAP titration to help with habituation.  Medication:   Outpatient Encounter Prescriptions as of 12/25/2014  Medication Sig  . atenolol (TENORMIN) 50 MG tablet TAKE 1 TABLET DAILY (OFFICE VISIT REQUIRED FOR  ADDITIONAL REFILLS)  . cholecalciferol (VITAMIN D) 1000 UNITS tablet Take 1,000 Units by mouth 2 (two) times daily.    Marland Kitchen escitalopram (LEXAPRO) 20 MG tablet Take 1 tablet (20 mg total) by mouth daily.  . fenofibrate 160 MG tablet TAKE 1 TABLET DAILY  . fluticasone (FLONASE) 50 MCG/ACT nasal spray Place 2 sprays into both nostrils daily. (Patient taking differently: Place 2 sprays into both nostrils at bedtime. )  . metFORMIN (GLUCOPHAGE) 1000 MG tablet Take 1 tablet (1,000 mg total) by mouth 2 (two) times daily with a meal.  . scopolamine (TRANSDERM-SCOP, 1.5 MG,) 1 MG/3DAYS Place 1 patch (1.5 mg total) onto the skin every 3 (three) days.   No facility-administered encounter medications on file as of 12/25/2014.     Allergies:  Review of patient's allergies indicates no known allergies.  Review of Systems: Gen:  Denies  fever, sweats. HEENT: Denies blurred vision. Cvc:  No dizziness, chest pain or heaviness Resp:   Denies cough or sputum porduction. Gi: Denies swallowing difficulty, stomach pain. constipation, bowel incontinence Gu:  Denies bladder incontinence, burning urine Ext:   No Joint pain, stiffness. Skin: No skin rash, easy bruising. Endoc:  No polyuria, polydipsia. Psych: No depression, insomnia. Other:  All other systems were reviewed and found to be negative other than what is mentioned in the HPI.   Physical Examination:   VS: BP 128/66 mmHg  Pulse 52  Ht 5\' 10"  (1.778  m)  Wt 280 lb 12.8 oz (127.37 kg)  BMI 40.29 kg/m2  SpO2 97%  General Appearance: No distress  Neuro:without focal findings,  speech normal,  HEENT: PERRLA, EOM intact. Pulmonary: normal breath sounds, No wheezing.   CardiovascularNormal S1,S2.  No m/r/g.   Abdomen: Benign, Soft, non-tender. Renal:  No costovertebral tenderness  GU:  Not performed at this time. Endoc: No evident thyromegaly, no signs of acromegaly. Skin:   warm, no rash. Extremities: normal, no cyanosis,  clubbing.   LABORATORY PANEL:   CBC No results for input(s): WBC, HGB, HCT, PLT in the last 168 hours. ------------------------------------------------------------------------------------------------------------------  Chemistries  No results for input(s): NA, K, CL, CO2, GLUCOSE, BUN, CREATININE, CALCIUM, MG, AST, ALT, ALKPHOS, BILITOT in the last 168 hours.  Invalid input(s): GFRCGP ------------------------------------------------------------------------------------------------------------------  Cardiac Enzymes No results for input(s): TROPONINI in the last 168 hours. ------------------------------------------------------------  RADIOLOGY:   No results found for this or any previous visit. Results for orders placed during the hospital encounter of 05/21/04  DG Chest 2 View   Narrative Clinical data: 49 year old male; herniated disc; hypertension; hx of smoking  CHEST - 2 VIEW:   Mild interstitial prominence and central peribronchial thickening. No acute pneumonia, edema, congestive heart failure, effusion, or pneumothorax. Heart size is normal.   IMPRESSION:  No acute chest disease.  Provider: Anne Ng   ------------------------------------------------------------------------------------------------------------------  Thank  you for allowing [facility], Critical Care to assist in the care of your patient. Our recommendations are noted above.  Please contact us if we can be of further service.   Marda Stalker, MD.  [place] [facility] Office Number: [phone]  Patricia Pesa, M.D.  Vilinda Boehringer, M.D.  Merton Border, M.D

## 2014-12-28 ENCOUNTER — Telehealth: Payer: Self-pay | Admitting: Internal Medicine

## 2014-12-28 DIAGNOSIS — G4733 Obstructive sleep apnea (adult) (pediatric): Secondary | ICD-10-CM

## 2014-12-28 NOTE — Telephone Encounter (Signed)
Change CPAP to 18 cm water ---   Order was placed for setting of 10 cm that needs to be cancelled. New order has been placed. Will make Rhonda aware.

## 2015-02-25 ENCOUNTER — Other Ambulatory Visit: Payer: Self-pay | Admitting: Family Medicine

## 2015-04-28 DIAGNOSIS — G4733 Obstructive sleep apnea (adult) (pediatric): Secondary | ICD-10-CM | POA: Diagnosis not present

## 2015-05-28 ENCOUNTER — Other Ambulatory Visit: Payer: Self-pay | Admitting: Family Medicine

## 2015-05-28 DIAGNOSIS — G4733 Obstructive sleep apnea (adult) (pediatric): Secondary | ICD-10-CM | POA: Diagnosis not present

## 2015-06-07 DIAGNOSIS — G4733 Obstructive sleep apnea (adult) (pediatric): Secondary | ICD-10-CM | POA: Diagnosis not present

## 2015-06-28 DIAGNOSIS — G4733 Obstructive sleep apnea (adult) (pediatric): Secondary | ICD-10-CM | POA: Diagnosis not present

## 2015-07-18 ENCOUNTER — Other Ambulatory Visit: Payer: Self-pay | Admitting: Internal Medicine

## 2015-08-28 ENCOUNTER — Other Ambulatory Visit: Payer: Self-pay | Admitting: Family Medicine

## 2015-09-10 DIAGNOSIS — G4733 Obstructive sleep apnea (adult) (pediatric): Secondary | ICD-10-CM | POA: Diagnosis not present

## 2015-10-03 ENCOUNTER — Other Ambulatory Visit: Payer: Self-pay | Admitting: Family Medicine

## 2015-10-03 DIAGNOSIS — E119 Type 2 diabetes mellitus without complications: Secondary | ICD-10-CM

## 2015-10-03 DIAGNOSIS — Z0000 Encounter for general adult medical examination without abnormal findings: Secondary | ICD-10-CM

## 2015-10-03 DIAGNOSIS — E785 Hyperlipidemia, unspecified: Secondary | ICD-10-CM

## 2015-10-03 DIAGNOSIS — I10 Essential (primary) hypertension: Secondary | ICD-10-CM

## 2015-10-08 ENCOUNTER — Other Ambulatory Visit (INDEPENDENT_AMBULATORY_CARE_PROVIDER_SITE_OTHER): Payer: BLUE CROSS/BLUE SHIELD

## 2015-10-08 DIAGNOSIS — E119 Type 2 diabetes mellitus without complications: Secondary | ICD-10-CM | POA: Diagnosis not present

## 2015-10-08 DIAGNOSIS — Z0000 Encounter for general adult medical examination without abnormal findings: Secondary | ICD-10-CM

## 2015-10-08 LAB — COMPREHENSIVE METABOLIC PANEL WITH GFR
ALT: 29 U/L (ref 0–53)
AST: 26 U/L (ref 0–37)
Albumin: 4.4 g/dL (ref 3.5–5.2)
Alkaline Phosphatase: 32 U/L — ABNORMAL LOW (ref 39–117)
BUN: 20 mg/dL (ref 6–23)
CO2: 30 meq/L (ref 19–32)
Calcium: 9.4 mg/dL (ref 8.4–10.5)
Chloride: 103 meq/L (ref 96–112)
Creatinine, Ser: 1.17 mg/dL (ref 0.40–1.50)
GFR: 70.28 mL/min
Glucose, Bld: 107 mg/dL — ABNORMAL HIGH (ref 70–99)
Potassium: 4.9 meq/L (ref 3.5–5.1)
Sodium: 139 meq/L (ref 135–145)
Total Bilirubin: 0.4 mg/dL (ref 0.2–1.2)
Total Protein: 6.7 g/dL (ref 6.0–8.3)

## 2015-10-08 LAB — HEMOGLOBIN A1C: Hgb A1c MFr Bld: 6.2 % (ref 4.6–6.5)

## 2015-10-08 LAB — LIPID PANEL
Cholesterol: 178 mg/dL (ref 0–200)
HDL: 29.6 mg/dL — ABNORMAL LOW (ref >39.00)
LDL Cholesterol: 111 mg/dL — ABNORMAL HIGH (ref 0–99)
NonHDL: 148.4
Total CHOL/HDL Ratio: 6
Triglycerides: 186 mg/dL — ABNORMAL HIGH (ref 0.0–149.0)
VLDL: 37.2 mg/dL (ref 0.0–40.0)

## 2015-10-08 LAB — PSA: PSA: 0.5 ng/mL (ref 0.10–4.00)

## 2015-10-08 LAB — MICROALBUMIN / CREATININE URINE RATIO
Creatinine,U: 103.4 mg/dL
Microalb Creat Ratio: 0.7 mg/g (ref 0.0–30.0)
Microalb, Ur: 0.7 mg/dL (ref 0.0–1.9)

## 2015-10-14 ENCOUNTER — Encounter: Payer: Self-pay | Admitting: Family Medicine

## 2015-10-14 ENCOUNTER — Ambulatory Visit (INDEPENDENT_AMBULATORY_CARE_PROVIDER_SITE_OTHER): Payer: BLUE CROSS/BLUE SHIELD | Admitting: Family Medicine

## 2015-10-14 VITALS — BP 128/70 | HR 67 | Temp 98.3°F | Ht 69.5 in | Wt 268.8 lb

## 2015-10-14 DIAGNOSIS — J309 Allergic rhinitis, unspecified: Secondary | ICD-10-CM

## 2015-10-14 DIAGNOSIS — Z0000 Encounter for general adult medical examination without abnormal findings: Secondary | ICD-10-CM | POA: Diagnosis not present

## 2015-10-14 DIAGNOSIS — I10 Essential (primary) hypertension: Secondary | ICD-10-CM

## 2015-10-14 DIAGNOSIS — Z23 Encounter for immunization: Secondary | ICD-10-CM | POA: Diagnosis not present

## 2015-10-14 DIAGNOSIS — E785 Hyperlipidemia, unspecified: Secondary | ICD-10-CM

## 2015-10-14 DIAGNOSIS — F411 Generalized anxiety disorder: Secondary | ICD-10-CM

## 2015-10-14 DIAGNOSIS — E119 Type 2 diabetes mellitus without complications: Secondary | ICD-10-CM | POA: Diagnosis not present

## 2015-10-14 MED ORDER — PNEUMOCOCCAL VAC POLYVALENT 25 MCG/0.5ML IJ INJ
0.5000 mL | INJECTION | INTRAMUSCULAR | Status: AC
  Administered 2015-10-14: 0.5 mL via INTRAMUSCULAR

## 2015-10-14 NOTE — Patient Instructions (Signed)
Great to see you. Please schedule your eye exam.

## 2015-10-14 NOTE — Assessment & Plan Note (Signed)
Reviewed preventive care protocols, scheduled due services, and updated immunizations Discussed nutrition, exercise, diet, and healthy lifestyle.  

## 2015-10-14 NOTE — Progress Notes (Signed)
Pre visit review using our clinic review tool, if applicable. No additional management support is needed unless otherwise documented below in the visit note. 

## 2015-10-14 NOTE — Addendum Note (Signed)
Addended by: Modena Nunnery on: 10/14/2015 09:35 AM   Modules accepted: Orders

## 2015-10-14 NOTE — Assessment & Plan Note (Signed)
Good control. No changes made to rxs. Pneumovax today. The patient indicates understanding of these issues and agrees with the plan.

## 2015-10-14 NOTE — Progress Notes (Signed)
50 yo pleasant male here for CPX and follow up of chronic medical conditions.  Has lost some weight.  Did the whole30- lost 28 pounds.  HLD- LDL improved with fenofibrate.  He is compliant with his rxs.  Lab Results  Component Value Date   CHOL 178 10/08/2015   HDL 29.60 (L) 10/08/2015   LDLCALC 111 (H) 10/08/2015   LDLDIRECT 130.5 10/05/2012   TRIG 186.0 (H) 10/08/2015   CHOLHDL 6 10/08/2015   Prediabetes- on Metformin 1000 mg twice daily and a1c at goal.   Does not check his FSBS.  Denies any symptoms of hypoglycemia. Neg urine micro this month. Lab Results  Component Value Date   HGBA1C 6.2 10/08/2015   Denies increased thirst or urination.  Does have strong FH of DM.  .  Wt Readings from Last 3 Encounters:  10/14/15 268 lb 12 oz (121.9 kg)  12/25/14 280 lb 12.8 oz (127.4 kg)  10/30/14 256 lb (116.1 kg)   Anxiety better controlled with Lexapro 20 mg daily.  Denies any current symptoms of depression or anxiety.  Sleeping well.  Appetite good.     Patient Active Problem List  Diagnosis  . MALIGNANT MELANOMA IN SITU OF BACK  . Vitamin D deficiency  . HLD (hyperlipidemia)  . Essential hypertension  . HAY FEVER  . GERD  . GASTRITIS, CHRONIC  . IRRITABLE BOWEL SYNDROME  . HYPERGLYCEMIA  . Anxiety state  . Obesity  . Chronic rhinitis  . Visit for well man health check  . Diabetes [facility])   Past Medical History:  Diagnosis Date  . GERD (gastroesophageal reflux disease)   . Hyperlipidemia   . Hypertension    Past Surgical History:  Procedure Laterality Date  . CHOLECYSTECTOMY  01/1995  . [place] SURGERY  05/2004  . TONSILLECTOMY AND ADENOIDECTOMY     age 89   Social History  Substance Use Topics  . Smoking status: Former Smoker    Quit date: 02/25/2011  . Smokeless tobacco: Former Systems developer  . Alcohol use 0.0 oz/week     Comment: rare   Family History  Problem Relation Age of Onset  . Cancer Mother     ovarian  . Diabetes Mother   . Diabetes Father     No Known Allergies Current Outpatient Prescriptions on File Prior to Visit  Medication Sig Dispense Refill  . atenolol (TENORMIN) 50 MG tablet TAKE 1 TABLET DAILY (OFFICE VISIT REQUIRED FOR ADDITIONAL REFILLS) 90 tablet 2  . cholecalciferol (VITAMIN D) 1000 UNITS tablet Take 1,000 Units by mouth 2 (two) times daily.      Marland Kitchen escitalopram (LEXAPRO) 20 MG tablet Take 1 tablet (20 mg total) by mouth daily. COMPLETE PHYSICAL EXAM REQUIRED FOR ADDITIONAL REFILLS 90 tablet 0  . fenofibrate 160 MG tablet Take 1 tablet (160 mg total) by mouth daily. COMPLETE PHYSICAL EXAM REQUIRED FOR ADDITIONAL REFILLS 90 tablet 0  . fluticasone (FLONASE) 50 MCG/ACT nasal spray USE 2 SPRAYS IN EACH NOSTRIL DAILY 48 g 1  . metFORMIN (GLUCOPHAGE) 1000 MG tablet Take 1 tablet (1,000 mg total) by mouth 2 (two) times daily with a meal. COMPLETE PHYSICAL EXAM REQUIRED FOR ADDITIONAL REFILLS 180 tablet 0  . scopolamine (TRANSDERM-SCOP, 1.5 MG,) 1 MG/3DAYS Place 1 patch (1.5 mg total) onto the skin every 3 (three) days. 10 patch 12   No current facility-administered medications on file prior to visit.    The PMH, PSH, Social History, Family History, Medications, and allergies have been reviewed in [facility],  and have been updated if relevant.   ROS: Review of Systems  Constitutional: Negative.   HENT: Negative.   Respiratory: Negative.   Cardiovascular: Negative.   Genitourinary: Negative.   Musculoskeletal: Negative.   Skin: Negative.   Hematological: Negative.   Psychiatric/Behavioral: Negative.   All other systems reviewed and are negative.     Physical Exam  BP 128/70   Pulse 67   Temp 98.3 F (36.8 C) (Oral)   Ht 5' 9.5" (1.765 m)   Wt 268 lb 12 oz (121.9 kg)   SpO2 95%   BMI 39.12 kg/m  Wt Readings from Last 3 Encounters:  10/14/15 268 lb 12 oz (121.9 kg)  12/25/14 280 lb 12.8 oz (127.4 kg)  10/30/14 256 lb (116.1 kg)     General:  overweght male in NAD Eyes:  PERRL Ears:  External ear exam  shows no significant lesions or deformities.  Otoscopic examination reveals clear canals, tympanic membranes are intact bilaterally without bulging, retraction, inflammation or discharge. Hearing is grossly normal bilaterally. Nose:  External nasal examination shows no deformity or inflammation. Nasal mucosa are pink and moist without lesions or exudates. Mouth:  Oral mucosa and oropharynx without lesions or exudates.  Teeth in good repair. Neck:  no carotid bruit or thyromegaly no cervical or supraclavicular lymphadenopathy  Lungs:  Normal respiratory effort, chest expands symmetrically. Lungs are clear to auscultation, no crackles or wheezes. Heart:  Normal rate and regular rhythm. S1 and S2 normal without gallop, murmur, click, rub or other extra sounds. Abdomen:  Bowel sounds positive,abdomen soft and non-tender without masses, organomegaly or hernias noted. Extremities:  no edema

## 2015-10-14 NOTE — Assessment & Plan Note (Signed)
Well controlled.  No changes made. 

## 2015-10-14 NOTE — Assessment & Plan Note (Signed)
Improved control. No changes made.

## 2015-11-07 ENCOUNTER — Other Ambulatory Visit: Payer: Self-pay | Admitting: Family Medicine

## 2015-11-17 ENCOUNTER — Encounter: Payer: Self-pay | Admitting: Family Medicine

## 2015-11-19 ENCOUNTER — Ambulatory Visit (INDEPENDENT_AMBULATORY_CARE_PROVIDER_SITE_OTHER): Payer: BLUE CROSS/BLUE SHIELD | Admitting: Family Medicine

## 2015-11-19 DIAGNOSIS — S6992XA Unspecified injury of left wrist, hand and finger(s), initial encounter: Secondary | ICD-10-CM

## 2015-11-19 NOTE — Progress Notes (Signed)
Subjective:   Patient ID: Larry Edwards, male    DOB: 1965/06/20, 50 y.o.   MRN: [id number]  MARAT WENGEL is a pleasant 50 y.o. year old male who presents to clinic today with Hand Pain (L hand index finger)  on 11/19/2015  HPI:  Injured the 2nd digit on his left hand on 9/29.  While using a screw driver, it slipped and sliced off the nail bed and part of the nail.  He contacted his [facility] doc service on 10/25 and was prescribed a 10 day course of keflex three times daily. There is still some redness and therefore he comes in today for it to be seen.  No drainage or warmth from the injury.  No longer painful.  Current Outpatient Prescriptions on File Prior to Visit  Medication Sig Dispense Refill  . atenolol (TENORMIN) 50 MG tablet TAKE 1 TABLET DAILY (OFFICE VISIT REQUIRED FOR ADDITIONAL REFILLS) 90 tablet 2  . cholecalciferol (VITAMIN D) 1000 UNITS tablet Take 1,000 Units by mouth 2 (two) times daily.      Marland Kitchen escitalopram (LEXAPRO) 20 MG tablet Take 1 tablet (20 mg total) by mouth daily. 90 tablet 2  . fenofibrate 160 MG tablet Take 1 tablet (160 mg total) by mouth daily. 90 tablet 2  . fluticasone (FLONASE) 50 MCG/ACT nasal spray USE 2 SPRAYS IN EACH NOSTRIL DAILY 48 g 1  . metFORMIN (GLUCOPHAGE) 1000 MG tablet Take 1 tablet (1,000 mg total) by mouth 2 (two) times daily with a meal. 180 tablet 2  . scopolamine (TRANSDERM-SCOP, 1.5 MG,) 1 MG/3DAYS Place 1 patch (1.5 mg total) onto the skin every 3 (three) days. 10 patch 12   No current facility-administered medications on file prior to visit.     No Known Allergies  Past Medical History:  Diagnosis Date  . GERD (gastroesophageal reflux disease)   . Hyperlipidemia   . Hypertension     Past Surgical History:  Procedure Laterality Date  . CHOLECYSTECTOMY  01/1995  . [place] SURGERY  05/2004  . TONSILLECTOMY AND ADENOIDECTOMY     age 34    Family History  Problem Relation Age of Onset  . Cancer Mother    ovarian  . Diabetes Mother   . Diabetes Father     Social History   Social History  . Marital status: Single    Spouse name: N/A  . Number of children: N/A  . Years of education: N/A   Occupational History  . Not on file.   Social History Main Topics  . Smoking status: Former Smoker    Quit date: 02/25/2011  . Smokeless tobacco: Former Systems developer  . Alcohol use 0.0 oz/week     Comment: rare  . Drug use: No  . Sexual activity: Not on file   Other Topics Concern  . Not on file   Social History Narrative  . No narrative on file   The PMH, PSH, Social History, Family History, Medications, and allergies have been reviewed in [facility], and have been updated if relevant.   Review of Systems  Constitutional: Negative for fever.  Neurological: Negative.  Negative for numbness.  All other systems reviewed and are negative.      Objective:    BP 124/78   Pulse 61   Temp 98 F (36.7 C) (Oral)   Wt 274 lb (124.3 kg)   SpO2 97%   BMI 39.88 kg/m    Physical Exam  Constitutional: He is oriented to person, place,  and time. He appears well-developed and well-nourished. No distress.  HENT:  Head: Normocephalic.  Eyes: Conjunctivae are normal.  Cardiovascular: Normal rate.   Pulmonary/Chest: Effort normal.  Musculoskeletal:       Hands: Neurological: He is alert and oriented to person, place, and time. No cranial nerve deficit.  Skin: He is not diaphoretic.  Psychiatric: He has a normal mood and affect. Judgment and thought content normal.  Nursing note and vitals reviewed.         Assessment & Plan:   Fingernail injury, left, initial encounter No Follow-up on file.

## 2015-11-19 NOTE — Progress Notes (Signed)
Pre visit review using our clinic review tool, if applicable. No additional management support is needed unless otherwise documented below in the visit note. 

## 2015-11-19 NOTE — Assessment & Plan Note (Signed)
New- does not appear infected. Reassurance provided. Call or return to clinic prn if these symptoms worsen or fail to improve as anticipated. The patient indicates understanding of these issues and agrees with the plan.

## 2015-12-02 DIAGNOSIS — Z1283 Encounter for screening for malignant neoplasm of skin: Secondary | ICD-10-CM | POA: Diagnosis not present

## 2015-12-02 DIAGNOSIS — D239 Other benign neoplasm of skin, unspecified: Secondary | ICD-10-CM | POA: Diagnosis not present

## 2015-12-02 DIAGNOSIS — D485 Neoplasm of uncertain behavior of skin: Secondary | ICD-10-CM | POA: Diagnosis not present

## 2015-12-02 DIAGNOSIS — Z85820 Personal history of malignant melanoma of skin: Secondary | ICD-10-CM | POA: Diagnosis not present

## 2015-12-09 DIAGNOSIS — H52221 Regular astigmatism, right eye: Secondary | ICD-10-CM | POA: Diagnosis not present

## 2015-12-09 DIAGNOSIS — H35413 Lattice degeneration of retina, bilateral: Secondary | ICD-10-CM | POA: Diagnosis not present

## 2015-12-09 DIAGNOSIS — E119 Type 2 diabetes mellitus without complications: Secondary | ICD-10-CM | POA: Diagnosis not present

## 2016-01-08 ENCOUNTER — Other Ambulatory Visit: Payer: Self-pay | Admitting: Internal Medicine

## 2016-02-03 ENCOUNTER — Encounter: Payer: Self-pay | Admitting: Family Medicine

## 2016-02-17 DIAGNOSIS — G4733 Obstructive sleep apnea (adult) (pediatric): Secondary | ICD-10-CM | POA: Diagnosis not present

## 2016-03-01 ENCOUNTER — Other Ambulatory Visit: Payer: Self-pay | Admitting: Family Medicine

## 2016-04-14 ENCOUNTER — Other Ambulatory Visit: Payer: Self-pay | Admitting: Internal Medicine

## 2016-04-20 ENCOUNTER — Encounter: Payer: Self-pay | Admitting: Family Medicine

## 2016-05-21 DIAGNOSIS — G4733 Obstructive sleep apnea (adult) (pediatric): Secondary | ICD-10-CM | POA: Diagnosis not present

## 2016-06-28 ENCOUNTER — Other Ambulatory Visit: Payer: Self-pay | Admitting: Internal Medicine

## 2016-08-12 ENCOUNTER — Encounter: Payer: Self-pay | Admitting: Family Medicine

## 2016-08-13 ENCOUNTER — Encounter: Payer: Self-pay | Admitting: Family Medicine

## 2016-08-13 ENCOUNTER — Ambulatory Visit (INDEPENDENT_AMBULATORY_CARE_PROVIDER_SITE_OTHER): Payer: BLUE CROSS/BLUE SHIELD | Admitting: Family Medicine

## 2016-08-13 VITALS — BP 138/78 | HR 59 | Ht 70.0 in | Wt 291.0 lb

## 2016-08-13 DIAGNOSIS — H6123 Impacted cerumen, bilateral: Secondary | ICD-10-CM | POA: Diagnosis not present

## 2016-08-13 DIAGNOSIS — G5603 Carpal tunnel syndrome, bilateral upper limbs: Secondary | ICD-10-CM | POA: Insufficient documentation

## 2016-08-13 DIAGNOSIS — H6120 Impacted cerumen, unspecified ear: Secondary | ICD-10-CM | POA: Insufficient documentation

## 2016-08-13 NOTE — Progress Notes (Signed)
After OV, pt complained of left > right ear pressure. Ceruminosis is noted bilaterally.

## 2016-08-13 NOTE — Progress Notes (Signed)
Subjective:   Patient ID: Larry Edwards, male    DOB: February 10, 1965, 51 y.o.   MRN: [id number]  Larry Edwards is a pleasant 51 y.o. year old male who presents to clinic today with Hearing Problem and Hand Pain (along with along with tingiling got worse within the last 6 weeks )  on 08/13/2016  HPI:  6 weeks of tingling, numbness of fingers (1st through 3rd digits) bilaterally and a slight degree of pain in my hands.  Not waking him up yet at night but it is becoming more painful.  Gripping makes it worse.  Has not tried anything other than Ibuprofen for it.  Current Outpatient Prescriptions on File Prior to Visit  Medication Sig Dispense Refill  . atenolol (TENORMIN) 50 MG tablet TAKE 1 TABLET DAILY (OFFICE VISIT REQUIRED FOR ADDITIONAL REFILLS) 90 tablet 2  . cholecalciferol (VITAMIN D) 1000 UNITS tablet Take 1,000 Units by mouth 2 (two) times daily.      Marland Kitchen escitalopram (LEXAPRO) 20 MG tablet Take 1 tablet (20 mg total) by mouth daily. 90 tablet 2  . fenofibrate 160 MG tablet Take 1 tablet (160 mg total) by mouth daily. 90 tablet 2  . fluticasone (FLONASE) 50 MCG/ACT nasal spray USE 2 SPRAYS IN EACH NOSTRIL DAILY 48 g 0  . metFORMIN (GLUCOPHAGE) 1000 MG tablet Take 1 tablet (1,000 mg total) by mouth 2 (two) times daily with a meal. 180 tablet 2   No current facility-administered medications on file prior to visit.     No Known Allergies  Past Medical History:  Diagnosis Date  . GERD (gastroesophageal reflux disease)   . Hyperlipidemia   . Hypertension     Past Surgical History:  Procedure Laterality Date  . CHOLECYSTECTOMY  01/1995  . [place] SURGERY  05/2004  . TONSILLECTOMY AND ADENOIDECTOMY     age 51    Family History  Problem Relation Age of Onset  . Cancer Mother        ovarian  . Diabetes Mother   . Diabetes Father     Social History   Social History  . Marital status: Single    Spouse name: N/A  . Number of children: N/A  . Years of  education: N/A   Occupational History  . Not on file.   Social History Main Topics  . Smoking status: Former Smoker    Quit date: 02/25/2011  . Smokeless tobacco: Former Systems developer  . Alcohol use 0.0 oz/week     Comment: rare  . Drug use: No  . Sexual activity: Not on file   Other Topics Concern  . Not on file   Social History Narrative  . No narrative on file   The PMH, PSH, Social History, Family History, Medications, and allergies have been reviewed in [facility], and have been updated if relevant.  Review of Systems  Neurological: Positive for numbness. Negative for dizziness, tremors, seizures, syncope, speech difficulty and weakness.  All other systems reviewed and are negative.      Objective:    BP 138/78   Pulse (!) 59   Ht 5\' 10"  (1.778 m)   Wt 291 lb (132 kg)   SpO2 98%   BMI 41.75 kg/m    Physical Exam  Constitutional: He appears well-developed and well-nourished. No distress.  HENT:  Head: Normocephalic and atraumatic.  Eyes: Conjunctivae are normal.  Cardiovascular: Normal rate.   Pulmonary/Chest: Effort normal.  Musculoskeletal:       Right wrist: He  exhibits decreased range of motion and tenderness.  +tinnel bilaterally  Skin: Skin is warm and dry. He is not diaphoretic.  Psychiatric: He has a normal mood and affect. His behavior is normal. Judgment and thought content normal.  Nursing note and vitals reviewed.         Assessment & Plan:   Carpal tunnel syndrome on both sides No Follow-up on file.

## 2016-08-13 NOTE — Assessment & Plan Note (Signed)
New- right greater than left. He has a wrist splint at home for his left wrist. Given right wrist splint, exercises from sports med adviser. Call or return to clinic prn if these symptoms worsen or fail to improve as anticipated. The patient indicates understanding of these issues and agrees with the plan.

## 2016-08-13 NOTE — Assessment & Plan Note (Signed)
Ceruminosis is noted.  Wax is removed by syringing and manual debridement. Instructions for home care to prevent wax buildup are given.  

## 2016-08-20 ENCOUNTER — Encounter: Payer: Self-pay | Admitting: Family Medicine

## 2016-08-20 ENCOUNTER — Other Ambulatory Visit: Payer: Self-pay | Admitting: Family Medicine

## 2016-08-20 DIAGNOSIS — Z1211 Encounter for screening for malignant neoplasm of colon: Secondary | ICD-10-CM

## 2016-08-25 ENCOUNTER — Telehealth: Payer: Self-pay

## 2016-08-25 ENCOUNTER — Other Ambulatory Visit: Payer: Self-pay

## 2016-08-25 DIAGNOSIS — Z1211 Encounter for screening for malignant neoplasm of colon: Secondary | ICD-10-CM

## 2016-08-25 NOTE — Telephone Encounter (Signed)
Gastroenterology Pre-Procedure Review  Request Date: 10/30/16 Requesting Physician: Dr. Vicente Males  PATIENT REVIEW QUESTIONS: The patient responded to the following health history questions as indicated:    0. Are you being treated for any medical conditions currently? NO 1. Are you having any GI issues? no 2. Do you have a personal history of Polyps? no 3. Do you have a family history of Colon Cancer or Polyps? no 4. Diabetes Mellitus? no 5. Joint replacements in the past 12 months?no 6. Major health problems in the past 3 months?no 7. Any artificial heart valves, MVP, or defibrillator?no    MEDICATIONS & ALLERGIES:    Patient reports the following regarding taking any anticoagulation/antiplatelet therapy:   Plavix, Coumadin, Eliquis, Xarelto, Lovenox, Pradaxa, Brilinta, or Effient? no Aspirin? no  Patient confirms/reports the following medications:  Current Outpatient Prescriptions  Medication Sig Dispense Refill  . atenolol (TENORMIN) 50 MG tablet TAKE 1 TABLET DAILY (OFFICE VISIT REQUIRED FOR ADDITIONAL REFILLS) 90 tablet 2  . cholecalciferol (VITAMIN D) 1000 UNITS tablet Take 1,000 Units by mouth 2 (two) times daily.      Marland Kitchen escitalopram (LEXAPRO) 20 MG tablet Take 1 tablet (20 mg total) by mouth daily. 90 tablet 2  . fenofibrate 160 MG tablet Take 1 tablet (160 mg total) by mouth daily. 90 tablet 2  . fluticasone (FLONASE) 50 MCG/ACT nasal spray USE 2 SPRAYS IN EACH NOSTRIL DAILY 48 g 0  . metFORMIN (GLUCOPHAGE) 1000 MG tablet Take 1 tablet (1,000 mg total) by mouth 2 (two) times daily with a meal. 180 tablet 2   No current facility-administered medications for this visit.     Patient confirms/reports the following allergies:  No Known Allergies  No orders of the defined types were placed in this encounter.   AUTHORIZATION INFORMATION Primary Insurance: 1D#: Group #:  Secondary Insurance: 1D#: Group #:  SCHEDULE INFORMATION: Date: 10/30/16 Time: Location: [place]

## 2016-08-31 DIAGNOSIS — G4733 Obstructive sleep apnea (adult) (pediatric): Secondary | ICD-10-CM | POA: Diagnosis not present

## 2016-09-13 ENCOUNTER — Other Ambulatory Visit: Payer: Self-pay | Admitting: Internal Medicine

## 2016-09-13 ENCOUNTER — Other Ambulatory Visit: Payer: Self-pay | Admitting: Family Medicine

## 2016-09-28 ENCOUNTER — Other Ambulatory Visit: Payer: Self-pay | Admitting: Family Medicine

## 2016-09-28 DIAGNOSIS — Z0000 Encounter for general adult medical examination without abnormal findings: Secondary | ICD-10-CM

## 2016-10-09 ENCOUNTER — Other Ambulatory Visit (INDEPENDENT_AMBULATORY_CARE_PROVIDER_SITE_OTHER): Payer: BLUE CROSS/BLUE SHIELD

## 2016-10-09 DIAGNOSIS — Z0000 Encounter for general adult medical examination without abnormal findings: Secondary | ICD-10-CM

## 2016-10-09 LAB — LIPID PANEL
Cholesterol: 173 mg/dL (ref 0–200)
HDL: 23.6 mg/dL — ABNORMAL LOW (ref >39.00)
NonHDL: 149.47
Total CHOL/HDL Ratio: 7
Triglycerides: 381 mg/dL — ABNORMAL HIGH (ref 0.0–149.0)
VLDL: 76.2 mg/dL — ABNORMAL HIGH (ref 0.0–40.0)

## 2016-10-09 LAB — COMPREHENSIVE METABOLIC PANEL
ALT: 23 U/L (ref 0–53)
AST: 17 U/L (ref 0–37)
Albumin: 4.5 g/dL (ref 3.5–5.2)
Alkaline Phosphatase: 34 U/L — ABNORMAL LOW (ref 39–117)
BUN: 23 mg/dL (ref 6–23)
CO2: 27 mEq/L (ref 19–32)
Calcium: 9.4 mg/dL (ref 8.4–10.5)
Chloride: 102 mEq/L (ref 96–112)
Creatinine, Ser: 1.09 mg/dL (ref 0.40–1.50)
GFR: 75.95 mL/min (ref >60.00)
Glucose, Bld: 128 mg/dL — ABNORMAL HIGH (ref 70–99)
Potassium: 4.4 mEq/L (ref 3.5–5.1)
Sodium: 136 mEq/L (ref 135–145)
Total Bilirubin: 0.3 mg/dL (ref 0.2–1.2)
Total Protein: 6.4 g/dL (ref 6.0–8.3)

## 2016-10-09 LAB — PSA: PSA: 0.49 ng/mL (ref 0.10–4.00)

## 2016-10-09 LAB — CBC WITH DIFFERENTIAL/PLATELET
Basophils Absolute: 0 10*3/uL (ref 0.0–0.1)
Basophils Relative: 0.8 % (ref 0.0–3.0)
Eosinophils Absolute: 0.2 10*3/uL (ref 0.0–0.7)
Eosinophils Relative: 3.6 % (ref 0.0–5.0)
HCT: 40.6 % (ref 39.0–52.0)
Hemoglobin: 13.2 g/dL (ref 13.0–17.0)
Lymphocytes Relative: 38.8 % (ref 12.0–46.0)
Lymphs Abs: 2.4 10*3/uL (ref 0.7–4.0)
MCHC: 32.6 g/dL (ref 30.0–36.0)
MCV: 88.1 fl (ref 78.0–100.0)
Monocytes Absolute: 0.6 10*3/uL (ref 0.1–1.0)
Monocytes Relative: 10 % (ref 3.0–12.0)
Neutro Abs: 2.9 10*3/uL (ref 1.4–7.7)
Neutrophils Relative %: 46.8 % (ref 43.0–77.0)
Platelets: 236 10*3/uL (ref 150.0–400.0)
RBC: 4.61 Mil/uL (ref 4.22–5.81)
RDW: 14.2 % (ref 11.5–15.5)
WBC: 6.1 10*3/uL (ref 4.0–10.5)

## 2016-10-09 LAB — LDL CHOLESTEROL, DIRECT: Direct LDL: 89 mg/dL

## 2016-10-14 ENCOUNTER — Encounter: Payer: Self-pay | Admitting: Family Medicine

## 2016-10-14 ENCOUNTER — Ambulatory Visit (INDEPENDENT_AMBULATORY_CARE_PROVIDER_SITE_OTHER): Payer: BLUE CROSS/BLUE SHIELD | Admitting: Family Medicine

## 2016-10-14 VITALS — BP 132/58 | HR 71 | Temp 98.3°F | Resp 16 | Ht 69.75 in | Wt 290.5 lb

## 2016-10-14 DIAGNOSIS — Z0000 Encounter for general adult medical examination without abnormal findings: Secondary | ICD-10-CM | POA: Diagnosis not present

## 2016-10-14 DIAGNOSIS — E785 Hyperlipidemia, unspecified: Secondary | ICD-10-CM

## 2016-10-14 DIAGNOSIS — E119 Type 2 diabetes mellitus without complications: Secondary | ICD-10-CM | POA: Diagnosis not present

## 2016-10-14 DIAGNOSIS — I10 Essential (primary) hypertension: Secondary | ICD-10-CM | POA: Diagnosis not present

## 2016-10-14 NOTE — Progress Notes (Signed)
51 yo pleasant male here for CPX and follow up of chronic medical conditions.  Has colonoscopy appointment scheduled for this month.  HLD- LDL improved with fenofibrate.  He is compliant with his rxs.  Lab Results  Component Value Date   CHOL 173 10/09/2016   HDL 23.60 (L) 10/09/2016   LDLCALC 111 (H) 10/08/2015   LDLDIRECT 89.0 10/09/2016   TRIG 381.0 (H) 10/09/2016   CHOLHDL 7 10/09/2016   Diabetes- on Metformin 1000 mg twice daily.  Due for a1c.  Does not check his FSBS.  Denies any symptoms of hypoglycemia. Denies increased thirst or urination. Does have strong FH of DM.  .  Wt Readings from Last 3 Encounters:  10/14/16 290 lb 8 oz (131.8 kg)  08/13/16 291 lb (132 kg)  11/19/15 274 lb (124.3 kg)   Anxiety better controlled with Lexapro 20 mg daily.  Denies any current symptoms of depression or anxiety.  Sleeping well.  Appetite good    Patient Active Problem List   Diagnosis Date Noted  . Visit for well man health check 10/11/2014  . Diabetes ([place]) 10/11/2014  . Chronic rhinitis 08/27/2014  . Obesity 04/03/2014  . Anxiety state 10/10/2012  . HAY FEVER 03/21/2009  . 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 Vitamin D deficiency 12/19/2008  . HLD (hyperlipidemia) 08/26/2006  . Essential hypertension 08/26/2006  . GERD 08/26/2006  . GASTRITIS, CHRONIC 08/26/2006  . IRRITABLE BOWEL SYNDROME 08/26/2006  . HYPERGLYCEMIA 08/26/2006   Past Medical History:  Diagnosis Date  . GERD (gastroesophageal reflux disease)   . Hyperlipidemia   . Hypertension    Past Surgical History:  Procedure Laterality Date  . CHOLECYSTECTOMY  01/1995  . [place] SURGERY  05/2004  . TONSILLECTOMY AND ADENOIDECTOMY     age 71   Social History  Substance Use Topics  . Smoking status: Former Smoker    Quit date: 02/25/2011  . Smokeless tobacco: Former Systems developer  . Alcohol use 0.0 oz/week     Comment: rare   Family History  Problem Relation Age of Onset  . Cancer Mother    ovarian  . Diabetes Mother   . Diabetes Father    No Known Allergies Current Outpatient Prescriptions on File Prior to Visit  Medication Sig Dispense Refill  . atenolol (TENORMIN) 50 MG tablet TAKE 1 TABLET DAILY (OFFICE VISIT REQUIRED FOR ADDITIONAL REFILLS) 90 tablet 2  . cholecalciferol (VITAMIN D) 1000 UNITS tablet Take 1,000 Units by mouth 2 (two) times daily.      Marland Kitchen escitalopram (LEXAPRO) 20 MG tablet TAKE 1 TABLET DAILY 90 tablet 0  . fenofibrate 160 MG tablet TAKE 1 TABLET DAILY 90 tablet 0  . fluticasone (FLONASE) 50 MCG/ACT nasal spray USE 2 SPRAYS IN EACH NOSTRIL DAILY 48 g 0  . metFORMIN (GLUCOPHAGE) 1000 MG tablet TAKE 1 TABLET TWICE A DAY WITH MEALS 180 tablet 0   No current facility-administered medications on file prior to visit.    The PMH, PSH, Social History, Family History, Medications, and allergies have been reviewed in [facility], and have been updated if relevant.   ROS: Review of Systems  Constitutional: Negative.   HENT: Negative.   Eyes: Negative.   Respiratory: Negative.   Cardiovascular: Negative.   Gastrointestinal: Negative.   Endocrine: Negative.   Genitourinary: Negative.   Musculoskeletal: Negative.   Skin: Negative.   Allergic/Immunologic: Negative.   Neurological: Negative.   Hematological: Negative.   Psychiatric/Behavioral: Negative.   All other systems reviewed and  are negative.     Physical Exam  BP (!) 132/58 (BP Location: Left Arm, Patient Position: Sitting, Cuff Size: Large)   Pulse 71   Temp 98.3 F (36.8 C) (Oral)   Resp 16   Ht 5' 9.75" (1.772 m)   Wt 290 lb 8 oz (131.8 kg)   SpO2 95%   BMI 41.98 kg/m  Wt Readings from Last 3 Encounters:  10/14/16 290 lb 8 oz (131.8 kg)  08/13/16 291 lb (132 kg)  11/19/15 274 lb (124.3 kg)   General:  pleasant male in no acute distress Eyes:  PERRL Ears:  External ear exam shows no significant lesions or deformities.  TMs normal bilaterally Hearing is grossly normal bilaterally. Nose:   External nasal examination shows no deformity or inflammation. Nasal mucosa are pink and moist without lesions or exudates. Mouth:  Oral mucosa and oropharynx without lesions or exudates.  Teeth in good repair. Neck:  no carotid bruit or thyromegaly no cervical or supraclavicular lymphadenopathy  Lungs:  Normal respiratory effort, chest expands symmetrically. Lungs are clear to auscultation, no crackles or wheezes. Heart:  Normal rate and regular rhythm. S1 and S2 normal without gallop, murmur, click, rub or other extra sounds. Abdomen:  Bowel sounds positive,abdomen soft and non-tender without masses, organomegaly or hernias noted. Pulses:  R and L posterior tibial pulses are full and equal bilaterally  Extremities:  no edema  Psych:  Good eye contact, not anxious or depressed appearing

## 2016-10-14 NOTE — Assessment & Plan Note (Signed)
Normotensive 

## 2016-10-14 NOTE — Assessment & Plan Note (Signed)
Reviewed preventive care protocols, scheduled due services, and updated immunizations Discussed nutrition, exercise, diet, and healthy lifestyle.  

## 2016-10-14 NOTE — Assessment & Plan Note (Addendum)
Deteriorated. a1c today.

## 2016-10-14 NOTE — Patient Instructions (Signed)
Great to see you.  I will email you about your a1c.

## 2016-10-15 LAB — HEMOGLOBIN A1C
Est. average glucose Bld gHb Est-mCnc: 140 mg/dL
Hgb A1c MFr Bld: 6.5 % — ABNORMAL HIGH (ref 4.8–5.6)

## 2016-10-30 ENCOUNTER — Encounter
Admission: RE | Disposition: A | Discharge status: Home / Self Care | Payer: Self-pay | Source: Ambulatory Visit | Attending: Gastroenterology

## 2016-10-30 ENCOUNTER — Encounter: Payer: Self-pay | Admitting: *Deleted

## 2016-10-30 ENCOUNTER — Ambulatory Visit: Payer: BLUE CROSS/BLUE SHIELD | Admitting: Anesthesiology

## 2016-10-30 ENCOUNTER — Ambulatory Visit
Admission: RE | Admit: 2016-10-30 | Discharge: 2016-10-30 | Disposition: A | Discharge status: Home / Self Care | Payer: BLUE CROSS/BLUE SHIELD | Source: Ambulatory Visit | Attending: Gastroenterology | Admitting: Gastroenterology

## 2016-10-30 DIAGNOSIS — D124 Benign neoplasm of descending colon: Secondary | ICD-10-CM

## 2016-10-30 DIAGNOSIS — Z9989 Dependence on other enabling machines and devices: Secondary | ICD-10-CM | POA: Diagnosis not present

## 2016-10-30 DIAGNOSIS — Z79899 Other long term (current) drug therapy: Secondary | ICD-10-CM | POA: Diagnosis not present

## 2016-10-30 DIAGNOSIS — G4730 Sleep apnea, unspecified: Secondary | ICD-10-CM | POA: Insufficient documentation

## 2016-10-30 DIAGNOSIS — D120 Benign neoplasm of cecum: Secondary | ICD-10-CM | POA: Diagnosis not present

## 2016-10-30 DIAGNOSIS — D123 Benign neoplasm of transverse colon: Secondary | ICD-10-CM | POA: Diagnosis not present

## 2016-10-30 DIAGNOSIS — Z1211 Encounter for screening for malignant neoplasm of colon: Secondary | ICD-10-CM | POA: Insufficient documentation

## 2016-10-30 DIAGNOSIS — E785 Hyperlipidemia, unspecified: Secondary | ICD-10-CM | POA: Insufficient documentation

## 2016-10-30 DIAGNOSIS — E119 Type 2 diabetes mellitus without complications: Secondary | ICD-10-CM | POA: Insufficient documentation

## 2016-10-30 DIAGNOSIS — Z6841 Body Mass Index (BMI) 40.0 and over, adult: Secondary | ICD-10-CM | POA: Diagnosis not present

## 2016-10-30 DIAGNOSIS — I10 Essential (primary) hypertension: Secondary | ICD-10-CM | POA: Insufficient documentation

## 2016-10-30 DIAGNOSIS — K635 Polyp of colon: Secondary | ICD-10-CM | POA: Diagnosis not present

## 2016-10-30 DIAGNOSIS — Z7984 Long term (current) use of oral hypoglycemic drugs: Secondary | ICD-10-CM | POA: Insufficient documentation

## 2016-10-30 DIAGNOSIS — Z87891 Personal history of nicotine dependence: Secondary | ICD-10-CM | POA: Insufficient documentation

## 2016-10-30 DIAGNOSIS — K219 Gastro-esophageal reflux disease without esophagitis: Secondary | ICD-10-CM | POA: Diagnosis not present

## 2016-10-30 HISTORY — PX: COLONOSCOPY WITH PROPOFOL: SHX5780

## 2016-10-30 HISTORY — DX: Sleep apnea, unspecified: G47.30

## 2016-10-30 HISTORY — DX: Type 2 diabetes mellitus without complications: E11.9

## 2016-10-30 LAB — GLUCOSE, CAPILLARY: Glucose-Capillary: 112 mg/dL — ABNORMAL HIGH (ref 65–99)

## 2016-10-30 SURGERY — COLONOSCOPY WITH PROPOFOL
Anesthesia: General

## 2016-10-30 MED ORDER — LIDOCAINE HCL (PF) 2 % IJ SOLN
INTRAMUSCULAR | Status: AC
  Filled 2016-10-30: qty 10

## 2016-10-30 MED ORDER — PHENYLEPHRINE HCL 10 MG/ML IJ SOLN
INTRAMUSCULAR | Status: AC
Start: 2016-10-30 — End: ?
  Filled 2016-10-30: qty 1

## 2016-10-30 MED ORDER — SODIUM CHLORIDE 0.9 % IV SOLN
INTRAVENOUS | Status: DC
  Administered 2016-10-30: 07:00:00 via INTRAVENOUS

## 2016-10-30 MED ORDER — PROPOFOL 500 MG/50ML IV EMUL
INTRAVENOUS | Status: AC
Start: 2016-10-30 — End: ?
  Filled 2016-10-30: qty 50

## 2016-10-30 MED ORDER — GLYCOPYRROLATE 0.2 MG/ML IJ SOLN
INTRAMUSCULAR | Status: AC
  Filled 2016-10-30: qty 1

## 2016-10-30 MED ORDER — PROPOFOL 10 MG/ML IV BOLUS
INTRAVENOUS | Status: DC | PRN
  Administered 2016-10-30: 40 mg via INTRAVENOUS
  Administered 2016-10-30: 50 mg via INTRAVENOUS

## 2016-10-30 MED ORDER — PROPOFOL 10 MG/ML IV BOLUS
INTRAVENOUS | Status: AC
  Filled 2016-10-30: qty 20

## 2016-10-30 MED ORDER — PROPOFOL 500 MG/50ML IV EMUL
INTRAVENOUS | Status: AC
  Filled 2016-10-30: qty 50

## 2016-10-30 MED ORDER — PHENYLEPHRINE HCL 10 MG/ML IJ SOLN
INTRAMUSCULAR | Status: AC
  Filled 2016-10-30: qty 1

## 2016-10-30 MED ORDER — EPHEDRINE SULFATE 50 MG/ML IJ SOLN
INTRAMUSCULAR | Status: AC
  Filled 2016-10-30: qty 1

## 2016-10-30 MED ORDER — PROPOFOL 500 MG/50ML IV EMUL
INTRAVENOUS | Status: DC | PRN
  Administered 2016-10-30: 150 ug/kg/min via INTRAVENOUS

## 2016-10-30 MED ORDER — SUCCINYLCHOLINE CHLORIDE 20 MG/ML IJ SOLN
INTRAMUSCULAR | Status: AC
  Filled 2016-10-30: qty 1

## 2016-10-30 NOTE — Anesthesia Post-op Follow-up Note (Signed)
Anesthesia QCDR form completed.        

## 2016-10-30 NOTE — Anesthesia Preprocedure Evaluation (Signed)
Anesthesia Evaluation  Patient identified by MRN, date of birth, ID band Patient awake    Reviewed: Allergy & Precautions, H&P , NPO status , Patient's Chart, lab work & pertinent test results, reviewed documented beta blocker date and time   History of Anesthesia Complications Negative for: history of anesthetic complications  Airway Mallampati: III  TM Distance: >3 FB Neck ROM: full    Dental   Pulmonary neg shortness of breath, sleep apnea and Continuous Positive Airway Pressure Ventilation , neg COPD, neg recent URI, former smoker,           Cardiovascular Exercise Tolerance: Good hypertension, (-) angina(-) CAD, (-) Past MI, (-) Cardiac Stents and (-) CABG (-) dysrhythmias (-) Valvular Problems/Murmurs     Neuro/Psych negative neurological ROS  negative psych ROS   GI/Hepatic Neg liver ROS, GERD  ,  Endo/Other  diabetes, Well Controlled, Oral Hypoglycemic AgentsMorbid obesity  Renal/GU negative Renal ROS  negative genitourinary   Musculoskeletal   Abdominal   Peds  Hematology negative hematology ROS (+)   Anesthesia Other Findings Past Medical History: No date: Diabetes mellitus without complication (HCC) No date: GERD (gastroesophageal reflux disease) No date: Hyperlipidemia No date: Hypertension No date: Sleep apnea   Reproductive/Obstetrics negative OB ROS                             Anesthesia Physical Anesthesia Plan  ASA: III  Anesthesia Plan: General   Post-op Pain Management:    Induction: Intravenous  PONV Risk Score and Plan: 2 and Propofol infusion  Airway Management Planned: Nasal Cannula  Additional Equipment:   Intra-op Plan:   Post-operative Plan:   Informed Consent: I have reviewed the patients History and Physical, chart, labs and discussed the procedure including the risks, benefits and alternatives for the proposed anesthesia with the patient or  authorized representative who has indicated his/her understanding and acceptance.   Dental Advisory Given  Plan Discussed with: Anesthesiologist, CRNA and Surgeon  Anesthesia Plan Comments:         Anesthesia Quick Evaluation

## 2016-10-30 NOTE — Op Note (Signed)
[facility]enterology Patient Name: Larry Edwards Procedure Date: 10/30/2016 7:29 AM MRN: [id number] Account #: [id number] Date of Birth: Jun 08, 1965 Admit Type: Outpatient Age: 51 Room: [facility] ENDO ROOM 4 Gender: Male Note Status: Finalized Procedure:            Colonoscopy Indications:          Screening for colorectal malignant neoplasm Providers:            [name] MD, MD Referring MD:         Marciano Sequin. Deborra Medina (Referring MD) Medicines:            Monitored Anesthesia Care Complications:        No immediate complications. Procedure:            Pre-Anesthesia Assessment:                       - Prior to the procedure, a History and Physical was                        performed, and patient medications, allergies and                        sensitivities were reviewed. The patient's tolerance of                        previous anesthesia was reviewed.                       - The risks and benefits of the procedure and the                        sedation options and risks were discussed with the                        patient. All questions were answered and informed                        consent was obtained.                       - ASA Grade Assessment: III - A patient with severe                        systemic disease.                       After obtaining informed consent, the colonoscope was                        passed under direct vision. Throughout the procedure,                        the patient's blood pressure, pulse, and oxygen                        saturations were monitored continuously. The                        Colonoscope was introduced through the anus and  advanced to the the cecum, identified by the                        appendiceal orifice, IC valve and transillumination.                        The colonoscopy was performed with ease. The patient                        tolerated the procedure well. The  quality of the bowel                        preparation was good. Findings:      Two sessile polyps were found in the descending colon and transverse       colon. The polyps were 4 to 6 mm in size. These polyps were removed with       a cold snare. Resection and retrieval were complete.      A 3 mm polyp was found in the cecum. The polyp was sessile. The polyp       was removed with a cold biopsy forceps. Resection and retrieval were       complete.      The exam was otherwise without abnormality on direct and retroflexion       views. Impression:           - Two 4 to 6 mm polyps in the descending colon and in                        the transverse colon, removed with a cold snare.                        Resected and retrieved.                       - One 3 mm polyp in the cecum, removed with a cold                        biopsy forceps. Resected and retrieved.                       - The examination was otherwise normal on direct and                        retroflexion views. Recommendation:       - Discharge patient to home (with escort).                       - Resume previous diet.                       - Continue present medications.                       - Await pathology results.                       - Repeat colonoscopy in 3 - 5 years for surveillance                        based on pathology results. Procedure Code(s):    ---  Professional ---                       [phone], Colonoscopy, flexible; with removal of tumor(s),                        polyp(s), or other lesion(s) by snare technique                       [id number], 59, Colonoscopy, flexible; with biopsy, single                        or multiple Diagnosis Code(s):    --- Professional ---                       Z12.11, Encounter for screening for malignant neoplasm                        of colon                       D12.4, Benign neoplasm of descending colon                       D12.3, Benign neoplasm of transverse colon  (hepatic                        flexure or splenic flexure)                       D12.0, Benign neoplasm of cecum CPT copyright 2016 [employer]. All rights reserved. The codes documented in this report are preliminary and upon coder review may  be revised to meet current compliance requirements. [name], MD [name] MD, MD 10/30/2016 7:56:27 AM This report has been signed electronically. Number of Addenda: 0 Note Initiated On: 10/30/2016 7:29 AM Scope Withdrawal Time: 0 hours 16 minutes 2 seconds  Total Procedure Duration: 0 hours 21 minutes 7 seconds       [facility]

## 2016-10-30 NOTE — H&P (Signed)
  Larry Bellows MD [address] [place], [place] [address] Phone: [phone] Fax : [phone]  Primary Care Physician:  Larry Passy, MD Primary Gastroenterologist:  Dr. Jonathon Edwards   Pre-Procedure History & Physical: HPI:  Larry Edwards is a 51 y.o. male is here for an colonoscopy.   Past Medical History:  Diagnosis Date  . Diabetes mellitus without complication ([place])   . GERD (gastroesophageal reflux disease)   . Hyperlipidemia   . Hypertension   . Sleep apnea     Past Surgical History:  Procedure Laterality Date  . BACK SURGERY    . CHOLECYSTECTOMY  01/1995  . [place] SURGERY  05/2004  . TONSILLECTOMY AND ADENOIDECTOMY     age 75    Prior to Admission medications   Medication Sig Start Date End Date Taking? Authorizing Provider  atenolol (TENORMIN) 50 MG tablet TAKE 1 TABLET DAILY (OFFICE VISIT REQUIRED FOR ADDITIONAL REFILLS) 03/02/16  Yes Larry Passy, MD  cholecalciferol (VITAMIN D) 1000 UNITS tablet Take 1,000 Units by mouth 2 (two) times daily.     Yes [provider]  escitalopram (LEXAPRO) 20 MG tablet TAKE 1 TABLET DAILY 09/15/16  Yes Larry Passy, MD  fenofibrate 160 MG tablet TAKE 1 TABLET DAILY 09/15/16  Yes Larry Passy, MD  fluticasone [facility]) 50 MCG/ACT nasal spray USE 2 SPRAYS IN EACH NOSTRIL DAILY 06/29/16  Yes Laverle Hobby, MD  metFORMIN (GLUCOPHAGE) 1000 MG tablet TAKE 1 TABLET TWICE A DAY WITH MEALS 09/15/16  Yes Larry Passy, MD    Allergies as of 08/25/2016  . (No Known Allergies)    Family History  Problem Relation Age of Onset  . Cancer Mother        ovarian  . Diabetes Mother   . Diabetes Father     Social History   Social History  . Marital status: Single    Spouse name: N/A  . Number of children: N/A  . Years of education: N/A   Occupational History  . Not on file.   Social History Main Topics  . Smoking status: Former Smoker    Quit date: 02/25/2011  . Smokeless tobacco: Former Systems developer  .  Alcohol use 0.0 oz/week     Comment: rare  . Drug use: No  . Sexual activity: Not on file   Other Topics Concern  . Not on file   Social History Narrative  . No narrative on file    Review of Systems: See HPI, otherwise negative ROS  Physical Exam: BP 129/64   Pulse 60   Temp (!) 97.2 F (36.2 C) (Tympanic)   Resp 20   Ht 5\' 9"  (1.753 m)   Wt 290 lb (131.5 kg)   SpO2 96%   BMI 42.83 kg/m  General:   Alert,  pleasant and cooperative in NAD Head:  Normocephalic and atraumatic. Neck:  Supple; no masses or thyromegaly. Lungs:  Clear throughout to auscultation.    Heart:  Regular rate and rhythm. Abdomen:  Soft, nontender and nondistended. Normal bowel sounds, without guarding, and without rebound.   Neurologic:  Alert and  oriented x4;  grossly normal neurologically.  Impression/Plan: Larry Edwards is here for an colonoscopy to be performed for Screening colonoscopy average risk    Risks, benefits, limitations, and alternatives regarding  colonoscopy have been reviewed with the patient.  Questions have been answered.  All parties agreeable.   Larry Bellows, MD  10/30/2016, 7:27 AM

## 2016-10-30 NOTE — Transfer of Care (Signed)
Immediate Anesthesia Transfer of Care Note  Patient: Larry Edwards  Procedure(s) Performed: COLONOSCOPY WITH PROPOFOL (N/A )  Patient Location: PACU  Anesthesia Type:General  Level of Consciousness: drowsy and patient cooperative  Airway & Oxygen Therapy: Patient Spontanous Breathing and Patient connected to nasal cannula oxygen  Post-op Assessment: Report given to RN and Post -op Vital signs reviewed and stable  Post vital signs: Reviewed and stable  Last Vitals:  Vitals:   10/30/16 0658 10/30/16 0757  BP: 129/64 (!) 151/91  Pulse: 60 74  Resp: 20 19  Temp: (!) 36.2 C (!) 36.2 C  SpO2: 96% 98%    Last Pain:  Vitals:   10/30/16 0757  TempSrc: Tympanic         Complications: No apparent anesthesia complications

## 2016-10-30 NOTE — Anesthesia Procedure Notes (Signed)
Date/Time: 10/30/2016 7:31 AM Performed by: Darlyne Russian Pre-anesthesia Checklist: Patient identified, Emergency Drugs available, Suction available, Patient being monitored and Timeout performed Patient Re-evaluated:Patient Re-evaluated prior to induction Oxygen Delivery Method: Nasal cannula Placement Confirmation: positive ETCO2

## 2016-11-02 LAB — SURGICAL PATHOLOGY

## 2016-11-02 NOTE — Anesthesia Postprocedure Evaluation (Signed)
Anesthesia Post Note  Patient: Larry Edwards  Procedure(s) Performed: COLONOSCOPY WITH PROPOFOL (N/A )  Patient location during evaluation: Endoscopy Anesthesia Type: General Level of consciousness: awake and alert Pain management: pain level controlled Vital Signs Assessment: post-procedure vital signs reviewed and stable Respiratory status: spontaneous breathing, nonlabored ventilation, respiratory function stable and patient connected to nasal cannula oxygen Cardiovascular status: blood pressure returned to baseline and stable Postop Assessment: no apparent nausea or vomiting Anesthetic complications: no     Last Vitals:  Vitals:   10/30/16 0817 10/30/16 0827  BP: 136/71 (!) 145/75  Pulse: (!) 57 (!) 58  Resp: 11 16  Temp:    SpO2: 99% 98%    Last Pain:  Vitals:   10/31/16 1502  TempSrc:   PainSc: 0-No pain                 Martha Clan

## 2016-11-03 ENCOUNTER — Encounter: Payer: Self-pay | Admitting: Gastroenterology

## 2016-11-17 ENCOUNTER — Encounter: Payer: Self-pay | Admitting: Family Medicine

## 2016-11-18 ENCOUNTER — Other Ambulatory Visit: Payer: Self-pay | Admitting: Family Medicine

## 2016-12-04 DIAGNOSIS — G4733 Obstructive sleep apnea (adult) (pediatric): Secondary | ICD-10-CM | POA: Diagnosis not present

## 2016-12-07 DIAGNOSIS — D229 Melanocytic nevi, unspecified: Secondary | ICD-10-CM | POA: Diagnosis not present

## 2016-12-07 DIAGNOSIS — L820 Inflamed seborrheic keratosis: Secondary | ICD-10-CM | POA: Diagnosis not present

## 2016-12-07 DIAGNOSIS — Z85820 Personal history of malignant melanoma of skin: Secondary | ICD-10-CM | POA: Diagnosis not present

## 2016-12-07 DIAGNOSIS — Z1283 Encounter for screening for malignant neoplasm of skin: Secondary | ICD-10-CM | POA: Diagnosis not present

## 2016-12-07 DIAGNOSIS — D485 Neoplasm of uncertain behavior of skin: Secondary | ICD-10-CM | POA: Diagnosis not present

## 2016-12-07 DIAGNOSIS — L570 Actinic keratosis: Secondary | ICD-10-CM | POA: Diagnosis not present

## 2016-12-14 DIAGNOSIS — H35413 Lattice degeneration of retina, bilateral: Secondary | ICD-10-CM | POA: Diagnosis not present

## 2016-12-14 DIAGNOSIS — E119 Type 2 diabetes mellitus without complications: Secondary | ICD-10-CM | POA: Diagnosis not present

## 2016-12-14 LAB — HM DIABETES EYE EXAM

## 2017-02-17 ENCOUNTER — Other Ambulatory Visit: Payer: Self-pay | Admitting: Family Medicine

## 2017-03-11 DIAGNOSIS — G4733 Obstructive sleep apnea (adult) (pediatric): Secondary | ICD-10-CM | POA: Diagnosis not present

## 2017-04-20 DIAGNOSIS — M5414 Radiculopathy, thoracic region: Secondary | ICD-10-CM | POA: Diagnosis not present

## 2017-04-20 DIAGNOSIS — M5442 Lumbago with sciatica, left side: Secondary | ICD-10-CM | POA: Diagnosis not present

## 2017-04-20 DIAGNOSIS — M9904 Segmental and somatic dysfunction of sacral region: Secondary | ICD-10-CM | POA: Diagnosis not present

## 2017-04-20 DIAGNOSIS — M9903 Segmental and somatic dysfunction of lumbar region: Secondary | ICD-10-CM | POA: Diagnosis not present

## 2017-04-22 DIAGNOSIS — M9904 Segmental and somatic dysfunction of sacral region: Secondary | ICD-10-CM | POA: Diagnosis not present

## 2017-04-22 DIAGNOSIS — M9903 Segmental and somatic dysfunction of lumbar region: Secondary | ICD-10-CM | POA: Diagnosis not present

## 2017-04-22 DIAGNOSIS — M5442 Lumbago with sciatica, left side: Secondary | ICD-10-CM | POA: Diagnosis not present

## 2017-04-22 DIAGNOSIS — M5414 Radiculopathy, thoracic region: Secondary | ICD-10-CM | POA: Diagnosis not present

## 2017-04-26 DIAGNOSIS — M9903 Segmental and somatic dysfunction of lumbar region: Secondary | ICD-10-CM | POA: Diagnosis not present

## 2017-04-26 DIAGNOSIS — M5414 Radiculopathy, thoracic region: Secondary | ICD-10-CM | POA: Diagnosis not present

## 2017-04-26 DIAGNOSIS — M9904 Segmental and somatic dysfunction of sacral region: Secondary | ICD-10-CM | POA: Diagnosis not present

## 2017-04-26 DIAGNOSIS — M5442 Lumbago with sciatica, left side: Secondary | ICD-10-CM | POA: Diagnosis not present

## 2017-04-29 DIAGNOSIS — M5414 Radiculopathy, thoracic region: Secondary | ICD-10-CM | POA: Diagnosis not present

## 2017-04-29 DIAGNOSIS — M9904 Segmental and somatic dysfunction of sacral region: Secondary | ICD-10-CM | POA: Diagnosis not present

## 2017-04-29 DIAGNOSIS — M9903 Segmental and somatic dysfunction of lumbar region: Secondary | ICD-10-CM | POA: Diagnosis not present

## 2017-04-29 DIAGNOSIS — M5442 Lumbago with sciatica, left side: Secondary | ICD-10-CM | POA: Diagnosis not present

## 2017-05-04 ENCOUNTER — Other Ambulatory Visit: Payer: Self-pay | Admitting: Family Medicine

## 2017-05-04 DIAGNOSIS — M5414 Radiculopathy, thoracic region: Secondary | ICD-10-CM | POA: Diagnosis not present

## 2017-05-04 DIAGNOSIS — M5442 Lumbago with sciatica, left side: Secondary | ICD-10-CM | POA: Diagnosis not present

## 2017-05-04 DIAGNOSIS — M9904 Segmental and somatic dysfunction of sacral region: Secondary | ICD-10-CM | POA: Diagnosis not present

## 2017-05-04 DIAGNOSIS — M9903 Segmental and somatic dysfunction of lumbar region: Secondary | ICD-10-CM | POA: Diagnosis not present

## 2017-05-04 NOTE — Telephone Encounter (Signed)
Pt was advised last fill that needs OV for future fills/thx dmf

## 2017-05-06 DIAGNOSIS — M5414 Radiculopathy, thoracic region: Secondary | ICD-10-CM | POA: Diagnosis not present

## 2017-05-06 DIAGNOSIS — M9903 Segmental and somatic dysfunction of lumbar region: Secondary | ICD-10-CM | POA: Diagnosis not present

## 2017-05-06 DIAGNOSIS — M5442 Lumbago with sciatica, left side: Secondary | ICD-10-CM | POA: Diagnosis not present

## 2017-05-06 DIAGNOSIS — M9904 Segmental and somatic dysfunction of sacral region: Secondary | ICD-10-CM | POA: Diagnosis not present

## 2017-05-07 ENCOUNTER — Encounter: Payer: Self-pay | Admitting: Family Medicine

## 2017-05-11 ENCOUNTER — Other Ambulatory Visit: Payer: Self-pay

## 2017-05-11 DIAGNOSIS — M5414 Radiculopathy, thoracic region: Secondary | ICD-10-CM | POA: Diagnosis not present

## 2017-05-11 DIAGNOSIS — M9904 Segmental and somatic dysfunction of sacral region: Secondary | ICD-10-CM | POA: Diagnosis not present

## 2017-05-11 DIAGNOSIS — E119 Type 2 diabetes mellitus without complications: Secondary | ICD-10-CM

## 2017-05-11 DIAGNOSIS — I10 Essential (primary) hypertension: Secondary | ICD-10-CM

## 2017-05-11 DIAGNOSIS — M5442 Lumbago with sciatica, left side: Secondary | ICD-10-CM | POA: Diagnosis not present

## 2017-05-11 DIAGNOSIS — E785 Hyperlipidemia, unspecified: Secondary | ICD-10-CM

## 2017-05-11 DIAGNOSIS — M9903 Segmental and somatic dysfunction of lumbar region: Secondary | ICD-10-CM | POA: Diagnosis not present

## 2017-05-11 MED ORDER — ESCITALOPRAM OXALATE 20 MG PO TABS: 20.0000 mg | ORAL_TABLET | Freq: Every day | ORAL | 0 refills | Status: DC

## 2017-05-11 MED ORDER — FENOFIBRATE 160 MG PO TABS: 160.0000 mg | ORAL_TABLET | Freq: Every day | ORAL | 0 refills | Status: DC

## 2017-05-11 MED ORDER — METFORMIN HCL 1000 MG PO TABS: 1000.0000 mg | ORAL_TABLET | Freq: Two times a day (BID) | ORAL | 0 refills | Status: DC

## 2017-05-11 NOTE — Progress Notes (Signed)
Sent in Rx's per TA/created future order for CMP; Lipid; A1C to be done fasting for [facility] per pt req

## 2017-05-14 ENCOUNTER — Other Ambulatory Visit: Payer: Self-pay | Admitting: Family Medicine

## 2017-05-14 DIAGNOSIS — E119 Type 2 diabetes mellitus without complications: Secondary | ICD-10-CM | POA: Diagnosis not present

## 2017-05-14 DIAGNOSIS — E785 Hyperlipidemia, unspecified: Secondary | ICD-10-CM | POA: Diagnosis not present

## 2017-05-14 DIAGNOSIS — I10 Essential (primary) hypertension: Secondary | ICD-10-CM | POA: Diagnosis not present

## 2017-05-15 LAB — COMPREHENSIVE METABOLIC PANEL
ALT: 33 IU/L (ref 0–44)
AST: 30 IU/L (ref 0–40)
Albumin/Globulin Ratio: 2 (ref 1.2–2.2)
Albumin: 4.7 g/dL (ref 3.5–5.5)
Alkaline Phosphatase: 39 IU/L (ref 39–117)
BUN/Creatinine Ratio: 18 (ref 9–20)
BUN: 22 mg/dL (ref 6–24)
Bilirubin Total: <0.2 mg/dL (ref 0.0–1.2)
CO2: 22 mmol/L (ref 20–29)
Calcium: 9.7 mg/dL (ref 8.7–10.2)
Chloride: 105 mmol/L (ref 96–106)
Creatinine, Ser: 1.21 mg/dL (ref 0.76–1.27)
GFR calc Af Amer: 80 mL/min/{1.73_m2} (ref >59)
GFR calc non Af Amer: 69 mL/min/{1.73_m2} (ref >59)
Globulin, Total: 2.3 g/dL (ref 1.5–4.5)
Glucose: 122 mg/dL — ABNORMAL HIGH (ref 65–99)
Potassium: 4.7 mmol/L (ref 3.5–5.2)
Sodium: 142 mmol/L (ref 134–144)
Total Protein: 7 g/dL (ref 6.0–8.5)

## 2017-05-15 LAB — LIPID PANEL
Chol/HDL Ratio: 6.5 ratio — ABNORMAL HIGH (ref 0.0–5.0)
Cholesterol, Total: 170 mg/dL (ref 100–199)
HDL: 26 mg/dL — ABNORMAL LOW (ref >39)
LDL Calculated: 77 mg/dL (ref 0–99)
Triglycerides: 336 mg/dL — ABNORMAL HIGH (ref 0–149)
VLDL Cholesterol Cal: 67 mg/dL — ABNORMAL HIGH (ref 5–40)

## 2017-05-15 LAB — HEMOGLOBIN A1C
Est. average glucose Bld gHb Est-mCnc: 146 mg/dL
Hgb A1c MFr Bld: 6.7 % — ABNORMAL HIGH (ref 4.8–5.6)

## 2017-05-18 DIAGNOSIS — M5442 Lumbago with sciatica, left side: Secondary | ICD-10-CM | POA: Diagnosis not present

## 2017-05-18 DIAGNOSIS — M9903 Segmental and somatic dysfunction of lumbar region: Secondary | ICD-10-CM | POA: Diagnosis not present

## 2017-05-18 DIAGNOSIS — M9904 Segmental and somatic dysfunction of sacral region: Secondary | ICD-10-CM | POA: Diagnosis not present

## 2017-05-18 DIAGNOSIS — M5414 Radiculopathy, thoracic region: Secondary | ICD-10-CM | POA: Diagnosis not present

## 2017-06-01 DIAGNOSIS — M5414 Radiculopathy, thoracic region: Secondary | ICD-10-CM | POA: Diagnosis not present

## 2017-06-01 DIAGNOSIS — M9904 Segmental and somatic dysfunction of sacral region: Secondary | ICD-10-CM | POA: Diagnosis not present

## 2017-06-01 DIAGNOSIS — M9903 Segmental and somatic dysfunction of lumbar region: Secondary | ICD-10-CM | POA: Diagnosis not present

## 2017-06-01 DIAGNOSIS — M5442 Lumbago with sciatica, left side: Secondary | ICD-10-CM | POA: Diagnosis not present

## 2017-06-11 DIAGNOSIS — G4733 Obstructive sleep apnea (adult) (pediatric): Secondary | ICD-10-CM | POA: Diagnosis not present

## 2017-06-15 DIAGNOSIS — M5442 Lumbago with sciatica, left side: Secondary | ICD-10-CM | POA: Diagnosis not present

## 2017-06-15 DIAGNOSIS — M5414 Radiculopathy, thoracic region: Secondary | ICD-10-CM | POA: Diagnosis not present

## 2017-06-15 DIAGNOSIS — M9903 Segmental and somatic dysfunction of lumbar region: Secondary | ICD-10-CM | POA: Diagnosis not present

## 2017-06-15 DIAGNOSIS — M9904 Segmental and somatic dysfunction of sacral region: Secondary | ICD-10-CM | POA: Diagnosis not present

## 2017-07-19 ENCOUNTER — Other Ambulatory Visit: Payer: Self-pay | Admitting: Family Medicine

## 2017-07-20 DIAGNOSIS — M9904 Segmental and somatic dysfunction of sacral region: Secondary | ICD-10-CM | POA: Diagnosis not present

## 2017-07-20 DIAGNOSIS — M9903 Segmental and somatic dysfunction of lumbar region: Secondary | ICD-10-CM | POA: Diagnosis not present

## 2017-07-20 DIAGNOSIS — M5442 Lumbago with sciatica, left side: Secondary | ICD-10-CM | POA: Diagnosis not present

## 2017-07-20 DIAGNOSIS — M5414 Radiculopathy, thoracic region: Secondary | ICD-10-CM | POA: Diagnosis not present

## 2017-08-17 DIAGNOSIS — M5442 Lumbago with sciatica, left side: Secondary | ICD-10-CM | POA: Diagnosis not present

## 2017-08-17 DIAGNOSIS — M9903 Segmental and somatic dysfunction of lumbar region: Secondary | ICD-10-CM | POA: Diagnosis not present

## 2017-08-17 DIAGNOSIS — M9904 Segmental and somatic dysfunction of sacral region: Secondary | ICD-10-CM | POA: Diagnosis not present

## 2017-08-17 DIAGNOSIS — M5414 Radiculopathy, thoracic region: Secondary | ICD-10-CM | POA: Diagnosis not present

## 2017-09-01 ENCOUNTER — Encounter: Payer: Self-pay | Admitting: Podiatry

## 2017-09-01 ENCOUNTER — Ambulatory Visit (INDEPENDENT_AMBULATORY_CARE_PROVIDER_SITE_OTHER): Payer: BLUE CROSS/BLUE SHIELD

## 2017-09-01 ENCOUNTER — Ambulatory Visit (INDEPENDENT_AMBULATORY_CARE_PROVIDER_SITE_OTHER): Payer: BLUE CROSS/BLUE SHIELD | Admitting: Podiatry

## 2017-09-01 VITALS — BP 150/66 | HR 63 | Resp 18

## 2017-09-01 DIAGNOSIS — M722 Plantar fascial fibromatosis: Secondary | ICD-10-CM

## 2017-09-01 MED ORDER — MELOXICAM 15 MG PO TABS: 15.0000 mg | ORAL_TABLET | Freq: Every day | ORAL | 3 refills | Status: DC

## 2017-09-01 NOTE — Progress Notes (Signed)
Subjective:  Patient ID: Larry Edwards, male    DOB: [birth year],  MRN: [id number] HPI Chief Complaint  Patient presents with  . Foot Pain    Plantar heel left and plantar heel and arch right - aching x 3-4 months on left, years for right off and on, AM pain, some ankle pain and swelling, tried ice, has night splint, ibuprofen PRN, supterfeet insoles-some help    52 y.o. male presents with the above complaint.   ROS: Denies fever chills nausea vomiting muscle aches pains calf pain back pain chest pain shortness of breath.  Past Medical History:  Diagnosis Date  . Diabetes mellitus without complication ([place])   . GERD (gastroesophageal reflux disease)   . Hyperlipidemia   . Hypertension   . Sleep apnea    Past Surgical History:  Procedure Laterality Date  . BACK SURGERY    . CHOLECYSTECTOMY  01/1995  . COLONOSCOPY WITH PROPOFOL N/A 10/30/2016   Procedure: COLONOSCOPY WITH PROPOFOL;  Surgeon: [name], MD;  Location: [facility] ENDOSCOPY;  Service: Gastroenterology;  Laterality: N/A;  . LUMBAR [place] SURGERY  05/2004  . TONSILLECTOMY AND ADENOIDECTOMY     age 6    Current Outpatient Medications:  .  Omega-3 Fatty Acids (FISH OIL PO), Take by mouth., Disp: , Rfl:  .  atenolol (TENORMIN) 50 MG tablet, TAKE 1 TABLET DAILY (OFFICE VISIT REQUIRED FOR ADDITIONAL REFILLS), Disp: 90 tablet, Rfl: 0 .  cholecalciferol (VITAMIN D) 1000 UNITS tablet, Take 1,000 Units by mouth 2 (two) times daily.  , Disp: , Rfl:  .  escitalopram (LEXAPRO) 20 MG tablet, TAKE 1 TABLET DAILY, Disp: 90 tablet, Rfl: 0 .  fenofibrate 160 MG tablet, TAKE 1 TABLET DAILY, Disp: 90 tablet, Rfl: 0 .  meloxicam (MOBIC) 15 MG tablet, Take 1 tablet (15 mg total) by mouth daily., Disp: 30 tablet, Rfl: 3 .  metFORMIN (GLUCOPHAGE) 1000 MG tablet, TAKE 1 TABLET TWICE A DAY WITH MEALS, Disp: 180 tablet, Rfl: 0  No Known Allergies Review of Systems Objective:   Vitals:   09/01/17 1121  BP: (!) 150/66  Pulse: 63  Resp:  18    General: Well developed, nourished, in no acute distress, alert and oriented x3   Dermatological: Skin is warm, dry and supple bilateral. Nails x 10 are well maintained; remaining integument appears unremarkable at this time. There are no open sores, no preulcerative lesions, no rash or signs of infection present.  Vascular: Dorsalis Pedis artery and Posterior Tibial artery pedal pulses are 2/4 bilateral with immedate capillary fill time. Pedal hair growth present. No varicosities and no lower extremity edema present bilateral.   Neruologic: Grossly intact via light touch bilateral. Vibratory intact via tuning fork bilateral. Protective threshold with Semmes Wienstein monofilament intact to all pedal sites bilateral. Patellar and Achilles deep tendon reflexes 2+ bilateral. No Babinski or clonus noted bilateral.   Musculoskeletal: No gross boney pedal deformities bilateral. No pain, crepitus, or limitation noted with foot and ankle range of motion bilateral. Muscular strength 5/5 in all groups tested bilateral.  Gait: Unassisted, Nonantalgic.    Radiographs:  Radiographs taken today demonstrate an osseously mature foot with pes planus soft tissue increase in density plantar calcaneal insertion site of the left heel over the right.  Assessment & Plan:   Assessment: Plantar fasciitis bilateral with pes planus.    Plan: We discussed etiology pathology conservative surgical therapies at this point placed him and plantar fascial braces bilateral and a night splint.  Started him  on a meloxicam did not provide him with any prednisone because of his diabetes.  I injected the bilateral heels today with Kenalog 20 mg/mL and 10 mg of Marcaine.  Tolerated procedure well without complications.  Discussed appropriate shoe gear stretching exercise ice therapy also he will always measured for orthotics.     Max T. [place], [place]

## 2017-09-01 NOTE — Patient Instructions (Signed)
For instructions on how to put on your Plantar Fascial Brace, please visit [email or web address]   Plantar Fasciitis (Heel Spur Syndrome) with Rehab The plantar fascia is a fibrous, ligament-like, soft-tissue structure that spans the bottom of the foot. Plantar fasciitis is a condition that causes pain in the foot due to inflammation of the tissue. SYMPTOMS   Pain and tenderness on the underneath side of the foot.  Pain that worsens with standing or walking. CAUSES  Plantar fasciitis is caused by irritation and injury to the plantar fascia on the underneath side of the foot. Common mechanisms of injury include:  Direct trauma to bottom of the foot.  Damage to a small nerve that runs under the foot where the main fascia attaches to the heel bone.  Stress placed on the plantar fascia due to bone spurs. RISK INCREASES WITH:   Activities that place stress on the plantar fascia (running, jumping, pivoting, or cutting).  Poor strength and flexibility.  Improperly fitted shoes.  Tight calf muscles.  Flat feet.  Failure to warm-up properly before activity.  Obesity. PREVENTION  Warm up and stretch properly before activity.  Allow for adequate recovery between workouts.  Maintain physical fitness:  Strength, flexibility, and endurance.  Cardiovascular fitness.  Maintain a health body weight.  Avoid stress on the plantar fascia.  Wear properly fitted shoes, including arch supports for individuals who have flat feet.  PROGNOSIS  If treated properly, then the symptoms of plantar fasciitis usually resolve without surgery. However, occasionally surgery is necessary.  RELATED COMPLICATIONS   Recurrent symptoms that may result in a chronic condition.  Problems of the lower back that are caused by compensating for the injury, such as limping.  Pain or weakness of the foot during push-off following surgery.  Chronic inflammation, scarring, and partial or complete  fascia tear, occurring more often from repeated injections.  TREATMENT  Treatment initially involves the use of ice and medication to help reduce pain and inflammation. The use of strengthening and stretching exercises may help reduce pain with activity, especially stretches of the Achilles tendon. These exercises may be performed at home or with a therapist. Your caregiver may recommend that you use heel cups of arch supports to help reduce stress on the plantar fascia. Occasionally, corticosteroid injections are given to reduce inflammation. If symptoms persist for greater than 6 months despite non-surgical (conservative), then surgery may be recommended.   MEDICATION   If pain medication is necessary, then nonsteroidal anti-inflammatory medications, such as aspirin and ibuprofen, or other minor pain relievers, such as acetaminophen, are often recommended.  Do not take pain medication within 7 days before surgery.  Prescription pain relievers may be given if deemed necessary by your caregiver. Use only as directed and only as much as you need.  Corticosteroid injections may be given by your caregiver. These injections should be reserved for the most serious cases, because they may only be given a certain number of times.  HEAT AND COLD  Cold treatment (icing) relieves pain and reduces inflammation. Cold treatment should be applied for 10 to 15 minutes every 2 to 3 hours for inflammation and pain and immediately after any activity that aggravates your symptoms. Use ice packs or massage the area with a piece of ice (ice massage).  Heat treatment may be used prior to performing the stretching and strengthening activities prescribed by your caregiver, physical therapist, or athletic trainer. Use a heat pack or soak the injury in warm water.  SEEK IMMEDIATE MEDICAL   CARE IF:  Treatment seems to offer no benefit, or the condition worsens.  Any medications produce adverse side effects.   EXERCISES- RANGE OF MOTION (ROM) AND STRETCHING EXERCISES - Plantar Fasciitis (Heel Spur Syndrome) These exercises may help you when beginning to rehabilitate your injury. Your symptoms may resolve with or without further involvement from your physician, physical therapist or athletic trainer. While completing these exercises, remember:   Restoring tissue flexibility helps normal motion to return to the joints. This allows healthier, less painful movement and activity.  An effective stretch should be held for at least 30 seconds.  A stretch should never be painful. You should only feel a gentle lengthening or release in the stretched tissue.  RANGE OF MOTION - Toe Extension, Flexion  Sit with your right / left leg crossed over your opposite knee.  Grasp your toes and gently pull them back toward the top of your foot. You should feel a stretch on the bottom of your toes and/or foot.  Hold this stretch for 10 seconds.  Now, gently pull your toes toward the bottom of your foot. You should feel a stretch on the top of your toes and or foot.  Hold this stretch for 10 seconds. Repeat  times. Complete this stretch 3 times per day.   RANGE OF MOTION - Ankle Dorsiflexion, Active Assisted  Remove shoes and sit on a chair that is preferably not on a carpeted surface.  Place right / left foot under knee. Extend your opposite leg for support.  Keeping your heel down, slide your right / left foot back toward the chair until you feel a stretch at your ankle or calf. If you do not feel a stretch, slide your bottom forward to the edge of the chair, while still keeping your heel down.  Hold this stretch for 10 seconds. Repeat 3 times. Complete this stretch 2 times per day.   STRETCH  Gastroc, Standing  Place hands on wall.  Extend right / left leg, keeping the front knee somewhat bent.  Slightly point your toes inward on your back foot.  Keeping your right / left heel on the floor and your  knee straight, shift your weight toward the wall, not allowing your back to arch.  You should feel a gentle stretch in the right / left calf. Hold this position for 10 seconds. Repeat 3 times. Complete this stretch 2 times per day.  STRETCH  Soleus, Standing  Place hands on wall.  Extend right / left leg, keeping the other knee somewhat bent.  Slightly point your toes inward on your back foot.  Keep your right / left heel on the floor, bend your back knee, and slightly shift your weight over the back leg so that you feel a gentle stretch deep in your back calf.  Hold this position for 10 seconds. Repeat 3 times. Complete this stretch 2 times per day.  STRETCH  Gastrocsoleus, Standing  Note: This exercise can place a lot of stress on your foot and ankle. Please complete this exercise only if specifically instructed by your caregiver.   Place the ball of your right / left foot on a step, keeping your other foot firmly on the same step.  Hold on to the wall or a rail for balance.  Slowly lift your other foot, allowing your body weight to press your heel down over the edge of the step.  You should feel a stretch in your right / left calf.  Hold this   position for 10 seconds.  Repeat this exercise with a slight bend in your right / left knee. Repeat 3 times. Complete this stretch 2 times per day.   STRENGTHENING EXERCISES - Plantar Fasciitis (Heel Spur Syndrome)  These exercises may help you when beginning to rehabilitate your injury. They may resolve your symptoms with or without further involvement from your physician, physical therapist or athletic trainer. While completing these exercises, remember:   Muscles can gain both the endurance and the strength needed for everyday activities through controlled exercises.  Complete these exercises as instructed by your physician, physical therapist or athletic trainer. Progress the resistance and repetitions only as guided.  STRENGTH -  Towel Curls  Sit in a chair positioned on a non-carpeted surface.  Place your foot on a towel, keeping your heel on the floor.  Pull the towel toward your heel by only curling your toes. Keep your heel on the floor. Repeat 3 times. Complete this exercise 2 times per day.  STRENGTH - Ankle Inversion  Secure one end of a rubber exercise band/tubing to a fixed object (table, pole). Loop the other end around your foot just before your toes.  Place your fists between your knees. This will focus your strengthening at your ankle.  Slowly, pull your big toe up and in, making sure the band/tubing is positioned to resist the entire motion.  Hold this position for 10 seconds.  Have your muscles resist the band/tubing as it slowly pulls your foot back to the starting position. Repeat 3 times. Complete this exercises 2 times per day.  Document Released: 12/29/2004 Document Revised: 03/23/2011 Document Reviewed: 04/12/2008 [employer] Patient Information 2014 [employer]. 

## 2017-09-14 DIAGNOSIS — M5414 Radiculopathy, thoracic region: Secondary | ICD-10-CM | POA: Diagnosis not present

## 2017-09-14 DIAGNOSIS — M5442 Lumbago with sciatica, left side: Secondary | ICD-10-CM | POA: Diagnosis not present

## 2017-09-14 DIAGNOSIS — M9903 Segmental and somatic dysfunction of lumbar region: Secondary | ICD-10-CM | POA: Diagnosis not present

## 2017-09-14 DIAGNOSIS — M9904 Segmental and somatic dysfunction of sacral region: Secondary | ICD-10-CM | POA: Diagnosis not present

## 2017-09-22 ENCOUNTER — Ambulatory Visit (INDEPENDENT_AMBULATORY_CARE_PROVIDER_SITE_OTHER): Payer: BLUE CROSS/BLUE SHIELD | Admitting: Orthotics

## 2017-09-22 DIAGNOSIS — M722 Plantar fascial fibromatosis: Secondary | ICD-10-CM

## 2017-09-22 NOTE — Progress Notes (Signed)
Patient came in today to pick up custom made foot orthotics.  The goals were accomplished and the patient reported no dissatisfaction with said orthotics.  Patient was advised of breakin period and how to report any issues. 

## 2017-09-24 DIAGNOSIS — G4733 Obstructive sleep apnea (adult) (pediatric): Secondary | ICD-10-CM | POA: Diagnosis not present

## 2017-09-29 ENCOUNTER — Encounter: Payer: Self-pay | Admitting: Family Medicine

## 2017-10-05 ENCOUNTER — Other Ambulatory Visit: Payer: Self-pay | Admitting: Family Medicine

## 2017-10-12 DIAGNOSIS — M5414 Radiculopathy, thoracic region: Secondary | ICD-10-CM | POA: Diagnosis not present

## 2017-10-12 DIAGNOSIS — M9903 Segmental and somatic dysfunction of lumbar region: Secondary | ICD-10-CM | POA: Diagnosis not present

## 2017-10-12 DIAGNOSIS — M9904 Segmental and somatic dysfunction of sacral region: Secondary | ICD-10-CM | POA: Diagnosis not present

## 2017-10-12 DIAGNOSIS — M5442 Lumbago with sciatica, left side: Secondary | ICD-10-CM | POA: Diagnosis not present

## 2017-10-13 ENCOUNTER — Ambulatory Visit (INDEPENDENT_AMBULATORY_CARE_PROVIDER_SITE_OTHER): Payer: BLUE CROSS/BLUE SHIELD | Admitting: Podiatry

## 2017-10-13 ENCOUNTER — Other Ambulatory Visit: Payer: Self-pay

## 2017-10-13 ENCOUNTER — Encounter: Payer: Self-pay | Admitting: Podiatry

## 2017-10-13 DIAGNOSIS — E119 Type 2 diabetes mellitus without complications: Secondary | ICD-10-CM

## 2017-10-13 DIAGNOSIS — M722 Plantar fascial fibromatosis: Secondary | ICD-10-CM

## 2017-10-13 DIAGNOSIS — E785 Hyperlipidemia, unspecified: Secondary | ICD-10-CM

## 2017-10-13 DIAGNOSIS — Z789 Other specified health status: Secondary | ICD-10-CM

## 2017-10-13 DIAGNOSIS — Z0000 Encounter for general adult medical examination without abnormal findings: Secondary | ICD-10-CM

## 2017-10-13 DIAGNOSIS — I10 Essential (primary) hypertension: Secondary | ICD-10-CM

## 2017-10-13 DIAGNOSIS — Z125 Encounter for screening for malignant neoplasm of prostate: Secondary | ICD-10-CM

## 2017-10-13 MED ORDER — MELOXICAM 15 MG PO TABS: 15.0000 mg | ORAL_TABLET | Freq: Every day | ORAL | 3 refills | Status: DC

## 2017-10-13 NOTE — Progress Notes (Signed)
He presents today for follow-up of his bilateral plantar fasciitis.  States that the left one is still hurting the right one seems to be doing pretty good hurts little more in the arch than the left one does.  He did not realize he was disposed to continue to take his meloxicam.  Objective: Vital signs are stable alert and oriented x3.  Pulses are palpable.  He has pain on palpation medial calcaneal tubercle of the left heel only.  Assessment: Plantar fasciitis resolving right symptomatically.  Plan: Sterile Betadine skin prep injected 20 mg Kenalog 5 mg Marcaine point maximal tenderness left foot.  Started him back on his meloxicam.  Follow-up with him in 4 to 6 weeks.  He continues to wear his orthotics regularly.

## 2017-10-14 ENCOUNTER — Other Ambulatory Visit: Payer: Self-pay | Admitting: Family Medicine

## 2017-10-14 ENCOUNTER — Telehealth: Payer: Self-pay

## 2017-10-14 DIAGNOSIS — E785 Hyperlipidemia, unspecified: Secondary | ICD-10-CM | POA: Diagnosis not present

## 2017-10-14 DIAGNOSIS — I10 Essential (primary) hypertension: Secondary | ICD-10-CM | POA: Diagnosis not present

## 2017-10-14 DIAGNOSIS — Z125 Encounter for screening for malignant neoplasm of prostate: Secondary | ICD-10-CM | POA: Diagnosis not present

## 2017-10-14 DIAGNOSIS — Z0000 Encounter for general adult medical examination without abnormal findings: Secondary | ICD-10-CM | POA: Diagnosis not present

## 2017-10-14 DIAGNOSIS — E119 Type 2 diabetes mellitus without complications: Secondary | ICD-10-CM | POA: Diagnosis not present

## 2017-10-14 NOTE — Telephone Encounter (Signed)
Copied from [place] [id number]. Topic: Inquiry >> Oct 14, 2017  8:22 AM Oliver Pila B wrote: Reason for CRM: pt called to ask Sharyn Lull to fax over lab orders to [facility]; pt is there now; fax to [phone]

## 2017-10-14 NOTE — Telephone Encounter (Signed)
[facility] faxed labs over to [facility]

## 2017-10-15 LAB — COMPREHENSIVE METABOLIC PANEL
ALT: 29 IU/L (ref 0–44)
AST: 24 IU/L (ref 0–40)
Albumin/Globulin Ratio: 2.4 — ABNORMAL HIGH (ref 1.2–2.2)
Albumin: 5 g/dL (ref 3.5–5.5)
Alkaline Phosphatase: 35 IU/L — ABNORMAL LOW (ref 39–117)
BUN/Creatinine Ratio: 18 (ref 9–20)
BUN: 22 mg/dL (ref 6–24)
Bilirubin Total: <0.2 mg/dL (ref 0.0–1.2)
CO2: 21 mmol/L (ref 20–29)
Calcium: 10.3 mg/dL — ABNORMAL HIGH (ref 8.7–10.2)
Chloride: 99 mmol/L (ref 96–106)
Creatinine, Ser: 1.22 mg/dL (ref 0.76–1.27)
GFR calc Af Amer: 79 mL/min/{1.73_m2} (ref >59)
GFR calc non Af Amer: 68 mL/min/{1.73_m2} (ref >59)
Globulin, Total: 2.1 g/dL (ref 1.5–4.5)
Glucose: 102 mg/dL — ABNORMAL HIGH (ref 65–99)
Potassium: 4.9 mmol/L (ref 3.5–5.2)
Sodium: 140 mmol/L (ref 134–144)
Total Protein: 7.1 g/dL (ref 6.0–8.5)

## 2017-10-15 LAB — CBC WITH DIFFERENTIAL/PLATELET
Basophils Absolute: 0.1 10*3/uL (ref 0.0–0.2)
Basos: 1 %
EOS (ABSOLUTE): 0.2 10*3/uL (ref 0.0–0.4)
Eos: 2 %
Hematocrit: 42.2 % (ref 37.5–51.0)
Hemoglobin: 13.5 g/dL (ref 13.0–17.7)
Immature Grans (Abs): 0 10*3/uL (ref 0.0–0.1)
Immature Granulocytes: 0 %
Lymphocytes Absolute: 2.5 10*3/uL (ref 0.7–3.1)
Lymphs: 36 %
MCH: 26.8 pg (ref 26.6–33.0)
MCHC: 32 g/dL (ref 31.5–35.7)
MCV: 84 fL (ref 79–97)
Monocytes Absolute: 0.7 10*3/uL (ref 0.1–0.9)
Monocytes: 9 %
Neutrophils Absolute: 3.7 10*3/uL (ref 1.4–7.0)
Neutrophils: 52 %
Platelets: 274 10*3/uL (ref 150–450)
RBC: 5.04 x10E6/uL (ref 4.14–5.80)
RDW: 13.4 % (ref 12.3–15.4)
WBC: 7.1 10*3/uL (ref 3.4–10.8)

## 2017-10-15 LAB — LIPID PANEL
Chol/HDL Ratio: 5.7 ratio — ABNORMAL HIGH (ref 0.0–5.0)
Cholesterol, Total: 187 mg/dL (ref 100–199)
HDL: 33 mg/dL — ABNORMAL LOW (ref >39)
LDL Calculated: 127 mg/dL — ABNORMAL HIGH (ref 0–99)
Triglycerides: 137 mg/dL (ref 0–149)
VLDL Cholesterol Cal: 27 mg/dL (ref 5–40)

## 2017-10-15 LAB — HEMOGLOBIN A1C
Est. average glucose Bld gHb Est-mCnc: 131 mg/dL
Hgb A1c MFr Bld: 6.2 % — ABNORMAL HIGH (ref 4.8–5.6)

## 2017-10-15 LAB — PSA: Prostate Specific Ag, Serum: 0.6 ng/mL (ref 0.0–4.0)

## 2017-10-18 ENCOUNTER — Encounter: Payer: Self-pay | Admitting: Family Medicine

## 2017-10-18 ENCOUNTER — Ambulatory Visit (INDEPENDENT_AMBULATORY_CARE_PROVIDER_SITE_OTHER): Payer: BLUE CROSS/BLUE SHIELD | Admitting: Family Medicine

## 2017-10-18 VITALS — BP 136/78 | HR 59 | Temp 98.8°F | Ht 69.5 in | Wt 279.2 lb

## 2017-10-18 DIAGNOSIS — Z23 Encounter for immunization: Secondary | ICD-10-CM | POA: Diagnosis not present

## 2017-10-18 DIAGNOSIS — Z0001 Encounter for general adult medical examination with abnormal findings: Secondary | ICD-10-CM

## 2017-10-18 DIAGNOSIS — E785 Hyperlipidemia, unspecified: Secondary | ICD-10-CM | POA: Diagnosis not present

## 2017-10-18 DIAGNOSIS — I10 Essential (primary) hypertension: Secondary | ICD-10-CM | POA: Diagnosis not present

## 2017-10-18 DIAGNOSIS — Z0000 Encounter for general adult medical examination without abnormal findings: Secondary | ICD-10-CM

## 2017-10-18 DIAGNOSIS — E119 Type 2 diabetes mellitus without complications: Secondary | ICD-10-CM | POA: Diagnosis not present

## 2017-10-18 DIAGNOSIS — E66813 Obesity, class 3: Secondary | ICD-10-CM

## 2017-10-18 MED ORDER — ROSUVASTATIN CALCIUM 5 MG PO TABS: 5.0000 mg | ORAL_TABLET | Freq: Every day | ORAL | 1 refills | Status: DC

## 2017-10-18 NOTE — Assessment & Plan Note (Signed)
Well controlled on current rx. No changes made today. 

## 2017-10-18 NOTE — Assessment & Plan Note (Signed)
Deteriorated. Likely due to increased animal fats.  Discussed cutting back on saturated fats. Add Crestor 5 mg nightly.  Follow up labs in 8 weeks.

## 2017-10-18 NOTE — Assessment & Plan Note (Signed)
Improved. Congratulated him on success- advised cutting back on saturated.

## 2017-10-18 NOTE — Assessment & Plan Note (Signed)
Improved with current rxs and low carb diet.

## 2017-10-18 NOTE — Progress Notes (Signed)
Subjective:   Patient ID: Larry Edwards, male    DOB: November 28, 1965, 52 y.o.   MRN: [id number]  Larry Edwards is a pleasant 52 y.o. year old male who presents to clinic today with Annual Exam (Patient is here today for a CPE.  Fasting labs completed on 10.03.19.  Pt agrees to get Tdap today.)  on 10/18/2017  HPI:  Health Maintenance  Topic Date Due  . TETANUS/TDAP  07/04/2012  . FOOT EXAM  10/14/2017  . INFLUENZA VACCINE  04/12/2026 (Originally 08/12/2017)  . OPHTHALMOLOGY EXAM  12/23/2017  . HEMOGLOBIN A1C  04/15/2018  . COLONOSCOPY  10/30/2021  . PNEUMOCOCCAL POLYSACCHARIDE VACCINE AGE 31-64 HIGH RISK  Completed  . HIV Screening  Discontinued    Doing well.  Has lost weight doing low carb diet.  Approximately 12 pounds since June.  HLD- LDL deteriorated- not at goal for a diabetic. Likely due to increased increased animal fats with new diet.  Lab Results  Component Value Date   CHOL 187 10/14/2017   HDL 33 (L) 10/14/2017   LDLCALC 127 (H) 10/14/2017   LDLDIRECT 89.0 10/09/2016   TRIG 137 10/14/2017   CHOLHDL 5.7 (H) 10/14/2017   Lab Results  Component Value Date   ALT 29 10/14/2017   AST 24 10/14/2017   ALKPHOS 35 (L) 10/14/2017   BILITOT <0.2 10/14/2017    Diabetes- on Metformin 1000 mg twice daily.  Lab Results  Component Value Date   HGBA1C 6.2 (H) 10/14/2017    Does not check his FSBS.  Denies any symptoms of hypoglycemia. Denies increased thirst or urination. Does have strong FH of DM.    He has lost 12 pounds since last year. Wt Readings from Last 3 Encounters:  10/18/17 279 lb 3.2 oz (126.6 kg)  10/30/16 290 lb (131.5 kg)  10/14/16 290 lb 8 oz (131.8 kg)     Anxiety better controlled with Lexapro 20 mg daily.  Denies any current symptoms of depression or anxiety.  Sleeping well.    HTN- controlled on atenolol 50 mg daily. Denies CP, SOB, HA visual changes or LE edema.  Lab Results  Component Value Date   CREATININE 1.22 10/14/2017   Lab  Results  Component Value Date   PSA 0.49 10/09/2016   PSA 0.50 10/08/2015   PSA 0.66 10/04/2014     Current Outpatient Medications on File Prior to Visit  Medication Sig Dispense Refill  . atenolol (TENORMIN) 50 MG tablet TAKE 1 TABLET DAILY (OFFICE VISIT REQUIRED FOR ADDITIONAL REFILLS) 90 tablet 0  . cholecalciferol (VITAMIN D) 1000 UNITS tablet Take 1,000 Units by mouth 2 (two) times daily.      Marland Kitchen escitalopram (LEXAPRO) 20 MG tablet TAKE 1 TABLET DAILY 90 tablet 0  . fenofibrate 160 MG tablet TAKE 1 TABLET DAILY 90 tablet 0  . meloxicam (MOBIC) 15 MG tablet Take 1 tablet (15 mg total) by mouth daily. 30 tablet 3  . metFORMIN (GLUCOPHAGE) 1000 MG tablet TAKE 1 TABLET TWICE A DAY WITH MEALS 180 tablet 0  . Omega-3 Fatty Acids (FISH OIL PO) Take by mouth.     No current facility-administered medications on file prior to visit.     No Known Allergies  Past Medical History:  Diagnosis Date  . Diabetes mellitus without complication ([place])   . GERD (gastroesophageal reflux disease)   . Hyperlipidemia   . Hypertension   . Sleep apnea     Past Surgical History:  Procedure Laterality Date  .  BACK SURGERY    . CHOLECYSTECTOMY  01/1995  . COLONOSCOPY WITH PROPOFOL N/A 10/30/2016   Procedure: COLONOSCOPY WITH PROPOFOL;  Surgeon: [name], MD;  Location: [facility] ENDOSCOPY;  Service: Gastroenterology;  Laterality: N/A;  . LUMBAR [place] SURGERY  05/2004  . TONSILLECTOMY AND ADENOIDECTOMY     age 72    Family History  Problem Relation Age of Onset  . Cancer Mother        ovarian  . Diabetes Mother   . Diabetes Father     Social History   Socioeconomic History  . Marital status: Single    Spouse name: Not on file  . Number of children: Not on file  . Years of education: Not on file  . Highest education level: Not on file  Occupational History  . Not on file  Social Needs  . Financial resource strain: Not on file  . Food insecurity:    Worry: Not on file    Inability: Not  on file  . Transportation needs:    Medical: Not on file    Non-medical: Not on file  Tobacco Use  . Smoking status: Former Smoker    Last attempt to quit: 02/25/2011    Years since quitting: 6.6  . Smokeless tobacco: Former Network engineer and Sexual Activity  . Alcohol use: Yes    Alcohol/week: 0.0 standard drinks    Comment: rare  . Drug use: No  . Sexual activity: Not on file  Lifestyle  . Physical activity:    Days per week: Not on file    Minutes per session: Not on file  . Stress: Not on file  Relationships  . Social connections:    Talks on phone: Not on file    Gets together: Not on file    Attends religious service: Not on file    Active member of club or organization: Not on file    Attends meetings of clubs or organizations: Not on file    Relationship status: Not on file  . Intimate partner violence:    Fear of current or ex partner: Not on file    Emotionally abused: Not on file    Physically abused: Not on file    Forced sexual activity: Not on file  Other Topics Concern  . Not on file  Social History Narrative  . Not on file   The PMH, PSH, Social History, Family History, Medications, and allergies have been reviewed in [facility], and have been updated if relevant.   Review of Systems  Constitutional: Negative.   HENT: Negative.   Eyes: Negative.   Respiratory: Negative.   Cardiovascular: Negative.   Gastrointestinal: Negative.   Endocrine: Negative.   Genitourinary: Negative.   Musculoskeletal: Negative.   Allergic/Immunologic: Negative.   Neurological: Negative.   Hematological: Negative.   Psychiatric/Behavioral: Negative.   All other systems reviewed and are negative.      Objective:    BP 136/78 (BP Location: Left Arm, Patient Position: Sitting, Cuff Size: Normal)   Pulse (!) 59   Temp 98.8 F (37.1 C) (Oral)   Ht 5' 9.5" (1.765 m)   Wt 279 lb 3.2 oz (126.6 kg)   SpO2 97%   BMI 40.64 kg/m    Physical Exam  General:  pleasant male  in no acute distress Eyes:  PERRL Ears:  External ear exam shows no significant lesions or deformities.  TMs normal bilaterally Hearing is grossly normal bilaterally. Nose:  External nasal examination shows no  deformity or inflammation. Nasal mucosa are pink and moist without lesions or exudates. Mouth:  Oral mucosa and oropharynx without lesions or exudates.  Teeth in good repair. Neck:  no carotid bruit or thyromegaly no cervical or supraclavicular lymphadenopathy  Lungs:  Normal respiratory effort, chest expands symmetrically. Lungs are clear to auscultation, no crackles or wheezes. Heart:  Normal rate and regular rhythm. S1 and S2 normal without gallop, murmur, click, rub or other extra sounds. Abdomen:  Bowel sounds positive,abdomen soft and non-tender without masses, organomegaly or hernias noted. Prostate:  Prostate gland firm and smooth, 1 plus enlargement, no nodularity, tenderness, mass, asymmetry or induration. Pulses:  R and L posterior tibial pulses are full and equal bilaterally  Extremities:  no edema  Psych:  Good eye contact, not anxious or depressed appearing       Assessment & Plan:   Need for Tdap vaccination - Plan: Tdap vaccine greater than or equal to 7yo IM  Visit for well man health check  Essential hypertension  Type 2 diabetes mellitus without complication, without long-term current use of insulin (HCC)  Hyperlipidemia, unspecified hyperlipidemia type No follow-ups on file.

## 2017-10-18 NOTE — Assessment & Plan Note (Signed)
Reviewed preventive care protocols, scheduled due services, and updated immunizations Discussed nutrition, exercise, diet, and healthy lifestyle.  

## 2017-10-18 NOTE — Patient Instructions (Signed)
Great to see you.  Please stop taking fenofibrate.  Start taking Crestor 5 mg nightly. Update me with any side effects.  Have follow up fasting labs done in approximately 8 weeks.

## 2017-10-22 ENCOUNTER — Other Ambulatory Visit: Payer: Self-pay | Admitting: Family Medicine

## 2017-10-25 ENCOUNTER — Other Ambulatory Visit: Payer: Self-pay | Admitting: Family Medicine

## 2017-10-27 MED ORDER — ATENOLOL 50 MG PO TABS: 50.0000 mg | ORAL_TABLET | Freq: Every day | ORAL | 1 refills | Status: DC

## 2017-11-04 ENCOUNTER — Other Ambulatory Visit: Payer: Self-pay | Admitting: *Deleted

## 2017-11-04 MED ORDER — MELOXICAM 15 MG PO TABS: 15.0000 mg | ORAL_TABLET | Freq: Every day | ORAL | 2 refills | Status: DC

## 2017-11-16 DIAGNOSIS — M5414 Radiculopathy, thoracic region: Secondary | ICD-10-CM | POA: Diagnosis not present

## 2017-11-16 DIAGNOSIS — M9904 Segmental and somatic dysfunction of sacral region: Secondary | ICD-10-CM | POA: Diagnosis not present

## 2017-11-16 DIAGNOSIS — M5442 Lumbago with sciatica, left side: Secondary | ICD-10-CM | POA: Diagnosis not present

## 2017-11-16 DIAGNOSIS — M9903 Segmental and somatic dysfunction of lumbar region: Secondary | ICD-10-CM | POA: Diagnosis not present

## 2017-11-23 ENCOUNTER — Encounter: Payer: Self-pay | Admitting: Family Medicine

## 2017-11-24 ENCOUNTER — Encounter: Payer: Self-pay | Admitting: Podiatry

## 2017-11-24 ENCOUNTER — Ambulatory Visit (INDEPENDENT_AMBULATORY_CARE_PROVIDER_SITE_OTHER): Payer: BLUE CROSS/BLUE SHIELD | Admitting: Podiatry

## 2017-11-24 DIAGNOSIS — M722 Plantar fascial fibromatosis: Secondary | ICD-10-CM

## 2017-11-24 MED ORDER — FLUTICASONE PROPIONATE 50 MCG/ACT NA SUSP: 2.0000 | Freq: Every day | NASAL | 6 refills | Status: DC

## 2017-11-24 NOTE — Progress Notes (Signed)
He presents today for follow-up of his right heel is just a little bit of pain in the right arch but none in the left foot at all.  He would like to get a second pair of orthotics if they are covered.  He states that they seem to be doing just fine.  Objective: Vital signs are stable he is alert and oriented x3.  He continues to wear his orthotics on a regular basis.  Pulses are strong and palpable bilateral.  Neurologic sensorium is intact degenerative flexors are intact muscle strength is normal symmetrical.  Cutaneous evaluation demonstrates supple well-hydrated cutis no erythema edema sialitis drainage or odor.  He has pain on palpation medial calcaneal tubercle of the right heel or just distal to the right heel around the abductor tendon.  No tenderness on the left foot.  Assessment: Residual plantar fasciitis right.  Plan: At this point I went ahead and injected 20 mg Kenalog 5 numbers Marcaine point maximal tenderness of the right heel.  Tolerated procedure well without comp occasions.  Continue to utilize his orthotics on a regular basis we are going to check to see if another pair of orthotics would be covered with his insurance.

## 2017-12-06 DIAGNOSIS — L570 Actinic keratosis: Secondary | ICD-10-CM | POA: Diagnosis not present

## 2017-12-06 DIAGNOSIS — Z85820 Personal history of malignant melanoma of skin: Secondary | ICD-10-CM | POA: Diagnosis not present

## 2017-12-06 DIAGNOSIS — L820 Inflamed seborrheic keratosis: Secondary | ICD-10-CM | POA: Diagnosis not present

## 2017-12-06 DIAGNOSIS — D485 Neoplasm of uncertain behavior of skin: Secondary | ICD-10-CM | POA: Diagnosis not present

## 2017-12-06 DIAGNOSIS — Z1283 Encounter for screening for malignant neoplasm of skin: Secondary | ICD-10-CM | POA: Diagnosis not present

## 2017-12-08 ENCOUNTER — Other Ambulatory Visit: Payer: Self-pay | Admitting: Family Medicine

## 2017-12-08 ENCOUNTER — Telehealth: Payer: Self-pay | Admitting: Podiatry

## 2017-12-08 NOTE — Telephone Encounter (Signed)
Left message for pt that per insurance company only 1 pr of orthotics per yr will be covered.  Pt returned my call and is wanting to wait to get the second pair.

## 2017-12-13 ENCOUNTER — Encounter: Payer: Self-pay | Admitting: Family Medicine

## 2017-12-14 ENCOUNTER — Other Ambulatory Visit: Payer: Self-pay

## 2017-12-14 DIAGNOSIS — M9903 Segmental and somatic dysfunction of lumbar region: Secondary | ICD-10-CM | POA: Diagnosis not present

## 2017-12-14 DIAGNOSIS — M5442 Lumbago with sciatica, left side: Secondary | ICD-10-CM | POA: Diagnosis not present

## 2017-12-14 DIAGNOSIS — M9904 Segmental and somatic dysfunction of sacral region: Secondary | ICD-10-CM | POA: Diagnosis not present

## 2017-12-14 DIAGNOSIS — M5414 Radiculopathy, thoracic region: Secondary | ICD-10-CM | POA: Diagnosis not present

## 2017-12-14 MED ORDER — ROSUVASTATIN CALCIUM 5 MG PO TABS: ORAL_TABLET | ORAL | 0 refills | Status: DC

## 2017-12-14 MED ORDER — ROSUVASTATIN CALCIUM 5 MG PO TABS: ORAL_TABLET | ORAL | 32 refills | Status: DC

## 2017-12-14 NOTE — Progress Notes (Signed)
Rx for MO sent to [facility]/14d supply sent to local/faxed lab order to [facility] per pt req at [phone]/thx dmf

## 2017-12-15 ENCOUNTER — Other Ambulatory Visit: Payer: Self-pay | Admitting: Family Medicine

## 2017-12-15 DIAGNOSIS — E785 Hyperlipidemia, unspecified: Secondary | ICD-10-CM | POA: Diagnosis not present

## 2017-12-16 LAB — COMPREHENSIVE METABOLIC PANEL
ALT: 34 IU/L (ref 0–44)
AST: 25 IU/L (ref 0–40)
Albumin/Globulin Ratio: 2 (ref 1.2–2.2)
Albumin: 4.7 g/dL (ref 3.5–5.5)
Alkaline Phosphatase: 38 IU/L — ABNORMAL LOW (ref 39–117)
BUN/Creatinine Ratio: 16 (ref 9–20)
BUN: 17 mg/dL (ref 6–24)
Bilirubin Total: 0.3 mg/dL (ref 0.0–1.2)
CO2: 22 mmol/L (ref 20–29)
Calcium: 9.5 mg/dL (ref 8.7–10.2)
Chloride: 100 mmol/L (ref 96–106)
Creatinine, Ser: 1.04 mg/dL (ref 0.76–1.27)
GFR calc Af Amer: 96 mL/min/{1.73_m2} (ref >59)
GFR calc non Af Amer: 83 mL/min/{1.73_m2} (ref >59)
Globulin, Total: 2.3 g/dL (ref 1.5–4.5)
Glucose: 113 mg/dL — ABNORMAL HIGH (ref 65–99)
Potassium: 4.7 mmol/L (ref 3.5–5.2)
Sodium: 137 mmol/L (ref 134–144)
Total Protein: 7 g/dL (ref 6.0–8.5)

## 2017-12-16 LAB — LIPID PANEL
Chol/HDL Ratio: 4 ratio (ref 0.0–5.0)
Cholesterol, Total: 166 mg/dL (ref 100–199)
HDL: 41 mg/dL (ref >39)
LDL Calculated: 96 mg/dL (ref 0–99)
Triglycerides: 147 mg/dL (ref 0–149)
VLDL Cholesterol Cal: 29 mg/dL (ref 5–40)

## 2017-12-28 ENCOUNTER — Telehealth: Payer: Self-pay | Admitting: Internal Medicine

## 2017-12-28 DIAGNOSIS — G4733 Obstructive sleep apnea (adult) (pediatric): Secondary | ICD-10-CM | POA: Diagnosis not present

## 2017-12-29 ENCOUNTER — Ambulatory Visit: Payer: BLUE CROSS/BLUE SHIELD | Admitting: Podiatry

## 2017-12-29 NOTE — Telephone Encounter (Signed)
LMTCB

## 2017-12-29 NOTE — Telephone Encounter (Signed)
Returned call to patient and notified that he would not be a new patient. Last office visit 12/25/14 Pt has scheduled f/u 01/20/18. Nothing further needed.

## 2018-01-03 ENCOUNTER — Other Ambulatory Visit: Payer: Self-pay | Admitting: Family Medicine

## 2018-01-20 ENCOUNTER — Ambulatory Visit (INDEPENDENT_AMBULATORY_CARE_PROVIDER_SITE_OTHER): Payer: BLUE CROSS/BLUE SHIELD | Admitting: Internal Medicine

## 2018-01-20 ENCOUNTER — Encounter: Payer: Self-pay | Admitting: Internal Medicine

## 2018-01-20 VITALS — BP 162/80 | HR 61 | Ht 69.5 in | Wt 289.2 lb

## 2018-01-20 DIAGNOSIS — G4733 Obstructive sleep apnea (adult) (pediatric): Secondary | ICD-10-CM | POA: Diagnosis not present

## 2018-01-20 DIAGNOSIS — E119 Type 2 diabetes mellitus without complications: Secondary | ICD-10-CM | POA: Diagnosis not present

## 2018-01-20 LAB — HM DIABETES EYE EXAM

## 2018-01-20 NOTE — Progress Notes (Signed)
* [facility]     Assessment and Plan:  Severe obstructive sleep apnea with AHI of 63. -He remains on CPAP therapy and appears to be tolerating it well. -Continue CPAP at 18 cm H2O.  Diabetes mellitus, essential hypertension, GERD. -Borderline.   Anxiety.  -Controlled with lexapro.   Chronic rhinitis.  -Continue flonase.   Date: 01/20/2018  MRN# [id number] MUNIR VICTORIAN [birth year]   Larry Edwards is a 53 y.o. old male seen in follow up for chief complaint of  Chief Complaint  Patient presents with  . Follow-up    hasn't been seen in 4 years  . Sleep Apnea    using CPAP without any issues     HPI:  The patient is a 53 year old male with a history of obstructive sleep apnea, hypertension, GERD, anxiety.  He had a sleep study in 2016 which showed an AHI of 63 and a spontaneous arousal index of 12.  Subsequently he had a CPAP titration study which recommended CPAP at 18. He has been doing well with cpap, he is using it every night for about 7 hours per night, he is not sleepy during the day.  He is cleaning supplies once per week.   Review of sleep study testing from 08/30/2014: Severe sleep apnea with an apnea hypopnea index of 63. He also had a spontaneous arousal index of 12.  -CPAP titration study performed on 11/01/2014: Required CPAP of 18, started in a CPAP titration to help with habituation.  Medication:   Outpatient Encounter Medications as of 01/20/2018  Medication Sig  . atenolol (TENORMIN) 50 MG tablet Take 1 tablet (50 mg total) by mouth daily.  . cholecalciferol (VITAMIN D) 1000 UNITS tablet Take 1,000 Units by mouth 2 (two) times daily.    Marland Kitchen escitalopram (LEXAPRO) 20 MG tablet TAKE 1 TABLET DAILY  . fluticasone (FLONASE) 50 MCG/ACT nasal spray Place 2 sprays into both nostrils daily.  . meloxicam (MOBIC) 15 MG tablet Take 1 tablet (15 mg total) by mouth daily.  . metFORMIN (GLUCOPHAGE) 1000 MG tablet TAKE 1 TABLET TWICE A DAY WITH  MEALS  . Omega-3 Fatty Acids (FISH OIL PO) Take by mouth.  . rosuvastatin (CRESTOR) 5 MG tablet TAKE 1 TABLET BY MOUTH EVERYDAY AT BEDTIME   No facility-administered encounter medications on file as of 01/20/2018.      Allergies:  Patient has no known allergies.  Review of Systems:  Constitutional: Feels well. Cardiovascular: Denies chest pain, exertional chest pain.  Pulmonary: Denies hemoptysis, pleuritic chest pain.   The remainder of systems were reviewed and were found to be negative other than what is documented in the HPI.    Physical Examination:   VS: BP (!) 162/80 (BP Location: Left Arm, Cuff Size: Normal)   Pulse 61   Ht 5' 9.5" (1.765 m)   Wt 289 lb 3.2 oz (131.2 kg)   SpO2 97%   BMI 42.10 kg/m   General Appearance: No distress  Neuro:without focal findings, mental status, speech normal, alert and oriented HEENT: PERRLA, EOM intact Pulmonary: No wheezing, No rales  CardiovascularNormal S1,S2.  No m/r/g.  Abdomen: Benign, Soft, non-tender, No masses Renal:  No costovertebral tenderness  GU:  No performed at this time. Endoc: No evident thyromegaly, no signs of acromegaly or Cushing features Skin:   warm, no rashes, no ecchymosis  Extremities: normal, no cyanosis, clubbing.    LABORATORY PANEL:   CBC No results for input(s): WBC, HGB, HCT, PLT in the  last 168 hours. ------------------------------------------------------------------------------------------------------------------  Chemistries  No results for input(s): NA, K, CL, CO2, GLUCOSE, BUN, CREATININE, CALCIUM, MG, AST, ALT, ALKPHOS, BILITOT in the last 168 hours.  Invalid input(s): GFRCGP ------------------------------------------------------------------------------------------------------------------  Cardiac Enzymes No results for input(s): TROPONINI in the last 168 hours. ------------------------------------------------------------  RADIOLOGY:   No results found for this or any previous  visit. Results for orders placed during the hospital encounter of 05/21/04  DG Chest 2 View   Narrative Clinical data: 53 year old male; herniated disc; hypertension; hx of smoking  CHEST - 2 VIEW:   Mild interstitial prominence and central peribronchial thickening. No acute pneumonia, edema, congestive heart failure, effusion, or pneumothorax. Heart size is normal.   IMPRESSION:  No acute chest disease.  Provider: Anne Ng   ------------------------------------------------------------------------------------------------------------------  Thank  you for allowing [facility] to assist in the care of your patient. Our recommendations are noted above.  Please contact us if we can be of further service.  Marda Stalker, M.D., F.C.C.P.  Board Certified in Internal Medicine, Pulmonary Medicine, [place], and Sleep Medicine.  [facility] Office Number: [phone]

## 2018-01-20 NOTE — Patient Instructions (Addendum)
Will see if we can get a download from your CPAP to make sure your sleep apnea is adequately treated.   Will renew prescription for CPAP supplies.

## 2018-01-26 ENCOUNTER — Encounter: Payer: Self-pay | Admitting: Family Medicine

## 2018-01-26 NOTE — Progress Notes (Signed)
Idelle Leech, O.Annely Sliva./thx dmf

## 2018-03-30 ENCOUNTER — Other Ambulatory Visit: Payer: Self-pay | Admitting: Family Medicine

## 2018-03-31 DIAGNOSIS — G4733 Obstructive sleep apnea (adult) (pediatric): Secondary | ICD-10-CM | POA: Diagnosis not present

## 2018-07-15 DIAGNOSIS — L820 Inflamed seborrheic keratosis: Secondary | ICD-10-CM | POA: Diagnosis not present

## 2018-07-15 DIAGNOSIS — D1800 Hemangioma unspecified site: Secondary | ICD-10-CM | POA: Diagnosis not present

## 2018-07-15 DIAGNOSIS — D225 Melanocytic nevi of trunk: Secondary | ICD-10-CM | POA: Diagnosis not present

## 2018-07-15 DIAGNOSIS — D485 Neoplasm of uncertain behavior of skin: Secondary | ICD-10-CM | POA: Diagnosis not present

## 2018-07-15 DIAGNOSIS — Z85820 Personal history of malignant melanoma of skin: Secondary | ICD-10-CM | POA: Diagnosis not present

## 2018-07-26 DIAGNOSIS — G4733 Obstructive sleep apnea (adult) (pediatric): Secondary | ICD-10-CM | POA: Diagnosis not present

## 2018-08-02 ENCOUNTER — Other Ambulatory Visit: Payer: Self-pay | Admitting: Podiatry

## 2018-08-02 ENCOUNTER — Other Ambulatory Visit: Payer: Self-pay | Admitting: Family Medicine

## 2018-09-28 ENCOUNTER — Other Ambulatory Visit: Payer: Self-pay

## 2018-09-28 ENCOUNTER — Encounter: Payer: Self-pay | Admitting: Family Medicine

## 2018-09-28 ENCOUNTER — Ambulatory Visit (INDEPENDENT_AMBULATORY_CARE_PROVIDER_SITE_OTHER): Payer: BC Managed Care – PPO | Admitting: Family Medicine

## 2018-09-28 VITALS — BP 128/70 | HR 70 | Ht 69.5 in | Wt 281.0 lb

## 2018-09-28 DIAGNOSIS — E119 Type 2 diabetes mellitus without complications: Secondary | ICD-10-CM

## 2018-09-28 DIAGNOSIS — M2550 Pain in unspecified joint: Secondary | ICD-10-CM | POA: Diagnosis not present

## 2018-09-28 DIAGNOSIS — E785 Hyperlipidemia, unspecified: Secondary | ICD-10-CM | POA: Diagnosis not present

## 2018-09-28 DIAGNOSIS — E559 Vitamin D deficiency, unspecified: Secondary | ICD-10-CM | POA: Diagnosis not present

## 2018-09-28 DIAGNOSIS — M791 Myalgia, unspecified site: Secondary | ICD-10-CM | POA: Diagnosis not present

## 2018-09-28 LAB — COMPREHENSIVE METABOLIC PANEL
ALT: 28 U/L (ref 0–53)
AST: 21 U/L (ref 0–37)
Albumin: 4.8 g/dL (ref 3.5–5.2)
Alkaline Phosphatase: 45 U/L (ref 39–117)
BUN: 19 mg/dL (ref 6–23)
CO2: 27 mEq/L (ref 19–32)
Calcium: 10.1 mg/dL (ref 8.4–10.5)
Chloride: 103 mEq/L (ref 96–112)
Creatinine, Ser: 0.99 mg/dL (ref 0.40–1.50)
GFR: 79.24 mL/min (ref >60.00)
Glucose, Bld: 105 mg/dL — ABNORMAL HIGH (ref 70–99)
Potassium: 4.5 mEq/L (ref 3.5–5.1)
Sodium: 139 mEq/L (ref 135–145)
Total Bilirubin: 0.4 mg/dL (ref 0.2–1.2)
Total Protein: 7.1 g/dL (ref 6.0–8.3)

## 2018-09-28 LAB — LIPID PANEL
Cholesterol: 164 mg/dL (ref 0–200)
HDL: 28.6 mg/dL — ABNORMAL LOW (ref >39.00)
Total CHOL/HDL Ratio: 6
Triglycerides: 461 mg/dL — ABNORMAL HIGH (ref 0.0–149.0)

## 2018-09-28 LAB — HEMOGLOBIN A1C: Hgb A1c MFr Bld: 6.8 % — ABNORMAL HIGH (ref 4.6–6.5)

## 2018-09-28 LAB — LDL CHOLESTEROL, DIRECT: Direct LDL: 75 mg/dL

## 2018-09-28 LAB — SEDIMENTATION RATE: Sed Rate: 9 mm/hr (ref 0–20)

## 2018-09-28 LAB — CK: Total CK: 329 U/L — ABNORMAL HIGH (ref 7–232)

## 2018-09-28 LAB — VITAMIN D 25 HYDROXY (VIT D DEFICIENCY, FRACTURES): VITD: 35.88 ng/mL (ref 30.00–100.00)

## 2018-09-28 MED ORDER — ROSUVASTATIN CALCIUM 5 MG PO TABS: ORAL_TABLET | ORAL | 0 refills | Status: DC

## 2018-09-28 NOTE — Progress Notes (Signed)
Subjective:   Patient ID: Larry Edwards, male    DOB: May 24, 1965, 53 y.o.   MRN: [id number]  Larry Edwards is a pleasant 53 y.o. year old male who presents to clinic today with Shoulder Pain, Elbow Pain, and myalgias  on 09/28/2018  HPI:  Bilateral right elbow pain, now including right elbow, bilateral shoulder pain and now pain behind both thighs.  Started a few months ago- progressing.  Denies fatigue or weakness.  No changes in desk height or work angles.  No known injury.  Has never had anything that seems to involve his muscles and his joints.  Has been wearing bilateral shoulder straps with a little relief.  HLD-  On crestor 5 mg nightly in 10/2017.  Lab Results  Component Value Date   CHOL 166 12/15/2017   HDL 41 12/15/2017   LDLCALC 96 12/15/2017   LDLDIRECT 89.0 10/09/2016   TRIG 147 12/15/2017   CHOLHDL 4.0 12/15/2017   The 10-year ASCVD risk score Mikey Bussing DC Jr., et al., 2013) is: 8.9%   Values used to calculate the score:     Age: 53 years     Sex: Male     Is Non-Hispanic African American: No     Diabetic: Yes     Tobacco smoker: No     Systolic Blood Pressure: 0000000 mmHg     Is BP treated: Yes     HDL Cholesterol: 41 mg/dL     Total Cholesterol: 166 mg/dL   Lab Results  Component Value Date   HGBA1C 6.2 (H) 10/14/2017    Current Outpatient Medications on File Prior to Visit  Medication Sig Dispense Refill  . atenolol (TENORMIN) 50 MG tablet TAKE 1 TABLET DAILY 90 tablet 3  . cholecalciferol (VITAMIN D) 1000 UNITS tablet Take 1,000 Units by mouth 2 (two) times daily.      Marland Kitchen escitalopram (LEXAPRO) 20 MG tablet TAKE 1 TABLET DAILY 90 tablet 0  . fluticasone (FLONASE) 50 MCG/ACT nasal spray Place 2 sprays into both nostrils daily. 16 g 6  . metFORMIN (GLUCOPHAGE) 1000 MG tablet TAKE 1 TABLET TWICE A DAY WITH MEALS 180 tablet 0  . Omega-3 Fatty Acids (FISH OIL PO) Take by mouth.     No current facility-administered medications on file prior to  visit.     No Known Allergies  Past Medical History:  Diagnosis Date  . Diabetes mellitus without complication ([place])   . GERD (gastroesophageal reflux disease)   . Hx of dysplastic nevus 12/03/2011   R shoulder, severe  . Hyperlipidemia   . Hypertension   . Sleep apnea     Past Surgical History:  Procedure Laterality Date  . BACK SURGERY    . CHOLECYSTECTOMY  01/1995  . COLONOSCOPY WITH PROPOFOL N/A 10/30/2016   Procedure: COLONOSCOPY WITH PROPOFOL;  Surgeon: [name], MD;  Location: [facility] ENDOSCOPY;  Service: Gastroenterology;  Laterality: N/A;  . LUMBAR [place] SURGERY  05/2004  . TONSILLECTOMY AND ADENOIDECTOMY     age 25    Family History  Problem Relation Age of Onset  . Cancer Mother        ovarian  . Diabetes Mother   . Diabetes Father     Social History   Socioeconomic History  . Marital status: Single    Spouse name: Not on file  . Number of children: Not on file  . Years of education: Not on file  . Highest education level: Not on file  Occupational History  .  Not on file  Social Needs  . Financial resource strain: Not on file  . Food insecurity    Worry: Not on file    Inability: Not on file  . Transportation needs    Medical: Not on file    Non-medical: Not on file  Tobacco Use  . Smoking status: Former Smoker    Quit date: 02/25/2011    Years since quitting: 7.5  . Smokeless tobacco: Former Network engineer and Sexual Activity  . Alcohol use: Yes    Alcohol/week: 0.0 standard drinks    Comment: rare  . Drug use: No  . Sexual activity: Not on file  Lifestyle  . Physical activity    Days per week: Not on file    Minutes per session: Not on file  . Stress: Not on file  Relationships  . Social Herbalist on phone: Not on file    Gets together: Not on file    Attends religious service: Not on file    Active member of club or organization: Not on file    Attends meetings of clubs or organizations: Not on file    Relationship  status: Not on file  . Intimate partner violence    Fear of current or ex partner: Not on file    Emotionally abused: Not on file    Physically abused: Not on file    Forced sexual activity: Not on file  Other Topics Concern  . Not on file  Social History Narrative  . Not on file   The PMH, PSH, Social History, Family History, Medications, and allergies have been reviewed in [facility], and have been updated if relevant.   Review of Systems  Constitutional: Negative.   HENT: Negative.   Respiratory: Negative.   Cardiovascular: Negative.   Gastrointestinal: Negative.   Endocrine: Negative.   Genitourinary: Negative.   Musculoskeletal: Positive for arthralgias, joint swelling and myalgias. Negative for back pain, gait problem, neck pain and neck stiffness.  Skin: Negative.   Allergic/Immunologic: Negative.   Neurological: Negative.   Hematological: Negative.   Psychiatric/Behavioral: Negative.   All other systems reviewed and are negative.      Objective:    BP 128/70   Pulse 70   Ht 5' 9.5" (1.765 m)   Wt 281 lb (127.5 kg)   SpO2 92%   BMI 40.90 kg/m    Physical Exam Nursing note reviewed.  Constitutional:      General: He is not in acute distress.    Appearance: Normal appearance. He is obese.  HENT:     Head: Normocephalic and atraumatic.     Right Ear: External ear normal.     Left Ear: External ear normal.     Mouth/Throat:     Mouth: Mucous membranes are moist.  Eyes:     Extraocular Movements: Extraocular movements intact.  Neck:     Musculoskeletal: Normal range of motion.  Cardiovascular:     Rate and Rhythm: Normal rate.     Pulses: Normal pulses.  Pulmonary:     Effort: Pulmonary effort is normal.  Musculoskeletal:     Right shoulder: He exhibits tenderness and pain. He exhibits normal range of motion, no bony tenderness, no swelling, no effusion, no crepitus, no deformity, no laceration, no spasm, normal pulse and normal strength.     Right elbow:  Tenderness found. Medial epicondyle tenderness noted.     Left elbow: Tenderness found. Medial epicondyle tenderness noted.  Right upper arm: He exhibits tenderness. He exhibits no bony tenderness, no swelling, no edema, no deformity and no laceration.     Right upper leg: He exhibits no tenderness, no bony tenderness, no swelling, no edema, no deformity and no laceration.     Left upper leg: He exhibits no tenderness, no bony tenderness, no swelling, no edema, no deformity and no laceration.  Skin:    General: Skin is warm and dry.  Neurological:     General: No focal deficit present.     Mental Status: He is alert and oriented to person, place, and time. Mental status is at baseline.     Cranial Nerves: No cranial nerve deficit.     Sensory: No sensory deficit.     Motor: No weakness.     Coordination: Coordination normal.     Gait: Gait normal.  Psychiatric:        Mood and Affect: Mood normal.        Behavior: Behavior normal.        Thought Content: Thought content normal.        Judgment: Judgment normal.           Assessment & Plan:   Arthralgia, unspecified joint - Plan: Sedimentation rate, Rheumatoid Factor  Vitamin D deficiency - Plan: Vitamin D (25 hydroxy)  Type 2 diabetes mellitus without complication, without long-term current use of insulin (HCC) - Plan: Hemoglobin A1c  Hyperlipidemia, unspecified hyperlipidemia type - Plan: Lipid panel, Comprehensive metabolic panel  Myalgia - Plan: CK (Creatine Kinase), Sedimentation rate, Rheumatoid Factor No follow-ups on file.

## 2018-09-28 NOTE — Assessment & Plan Note (Addendum)
>  25 minutes spent in face to face time with patient, >50% spent in counselling or coordination of care discussing arthralgia, myalgias and what could be causing them (statins, rheum issue, vitamin D deficiency)  and work up needed.  I do feel there is more than one issue occurring  here. 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 I do question if statin is playing a role= see below.

## 2018-09-28 NOTE — Patient Instructions (Addendum)
Great to see you. I will call you with your lab results from today and you can view them online.   Start CoQ 10- Start with a lower dose at bedtime.

## 2018-09-28 NOTE — Assessment & Plan Note (Signed)
Could be contributing factor. Check Vitamin D today. The patient indicates understanding of these issues and agrees with the plan.

## 2018-09-28 NOTE — Assessment & Plan Note (Signed)
Check a1c 

## 2018-09-28 NOTE — Assessment & Plan Note (Signed)
Decrease crestor from 5 mg nightly to 5 mg three times weekly. Will see if this improves his symptoms and check labs in a few months. Also add CoQ 10.  Check labs- Orders Placed This Encounter  Procedures  . Vitamin D (25 hydroxy)  . CK (Creatine Kinase)  . Sedimentation rate  . Rheumatoid Factor  . Lipid panel  . Hemoglobin A1c  . Comprehensive metabolic panel

## 2018-09-29 ENCOUNTER — Encounter: Payer: Self-pay | Admitting: Family Medicine

## 2018-09-29 LAB — RHEUMATOID FACTOR: Rheumatoid fact SerPl-aCnc: <14 IU/mL (ref <14)

## 2018-10-05 ENCOUNTER — Other Ambulatory Visit: Payer: Self-pay | Admitting: Family Medicine

## 2018-10-06 ENCOUNTER — Other Ambulatory Visit: Payer: Self-pay | Admitting: Family Medicine

## 2018-10-06 ENCOUNTER — Encounter: Payer: Self-pay | Admitting: Family Medicine

## 2018-10-06 DIAGNOSIS — R748 Abnormal levels of other serum enzymes: Secondary | ICD-10-CM

## 2018-10-06 DIAGNOSIS — E781 Pure hyperglyceridemia: Secondary | ICD-10-CM

## 2018-10-11 DIAGNOSIS — M9903 Segmental and somatic dysfunction of lumbar region: Secondary | ICD-10-CM | POA: Diagnosis not present

## 2018-10-11 DIAGNOSIS — M5442 Lumbago with sciatica, left side: Secondary | ICD-10-CM | POA: Diagnosis not present

## 2018-10-11 DIAGNOSIS — M9904 Segmental and somatic dysfunction of sacral region: Secondary | ICD-10-CM | POA: Diagnosis not present

## 2018-10-11 DIAGNOSIS — M5414 Radiculopathy, thoracic region: Secondary | ICD-10-CM | POA: Diagnosis not present

## 2018-10-12 ENCOUNTER — Other Ambulatory Visit: Payer: Self-pay | Admitting: Family Medicine

## 2018-10-12 DIAGNOSIS — R748 Abnormal levels of other serum enzymes: Secondary | ICD-10-CM | POA: Diagnosis not present

## 2018-10-13 DIAGNOSIS — M9903 Segmental and somatic dysfunction of lumbar region: Secondary | ICD-10-CM | POA: Diagnosis not present

## 2018-10-13 DIAGNOSIS — M5442 Lumbago with sciatica, left side: Secondary | ICD-10-CM | POA: Diagnosis not present

## 2018-10-13 DIAGNOSIS — M5414 Radiculopathy, thoracic region: Secondary | ICD-10-CM | POA: Diagnosis not present

## 2018-10-13 DIAGNOSIS — M9904 Segmental and somatic dysfunction of sacral region: Secondary | ICD-10-CM | POA: Diagnosis not present

## 2018-10-13 LAB — LIPID PANEL
Chol/HDL Ratio: 6.2 ratio — ABNORMAL HIGH (ref 0.0–5.0)
Cholesterol, Total: 179 mg/dL (ref 100–199)
HDL: 29 mg/dL — ABNORMAL LOW (ref >39)
LDL Chol Calc (NIH): 90 mg/dL (ref 0–99)
Triglycerides: 364 mg/dL — ABNORMAL HIGH (ref 0–149)
VLDL Cholesterol Cal: 60 mg/dL — ABNORMAL HIGH (ref 5–40)

## 2018-10-13 LAB — CK: Total CK: 381 U/L — ABNORMAL HIGH (ref 41–331)

## 2018-10-18 DIAGNOSIS — M9904 Segmental and somatic dysfunction of sacral region: Secondary | ICD-10-CM | POA: Diagnosis not present

## 2018-10-18 DIAGNOSIS — M5414 Radiculopathy, thoracic region: Secondary | ICD-10-CM | POA: Diagnosis not present

## 2018-10-18 DIAGNOSIS — M5442 Lumbago with sciatica, left side: Secondary | ICD-10-CM | POA: Diagnosis not present

## 2018-10-18 DIAGNOSIS — M9903 Segmental and somatic dysfunction of lumbar region: Secondary | ICD-10-CM | POA: Diagnosis not present

## 2018-10-20 ENCOUNTER — Other Ambulatory Visit: Payer: Self-pay

## 2018-10-20 DIAGNOSIS — M5442 Lumbago with sciatica, left side: Secondary | ICD-10-CM | POA: Diagnosis not present

## 2018-10-20 DIAGNOSIS — R748 Abnormal levels of other serum enzymes: Secondary | ICD-10-CM | POA: Insufficient documentation

## 2018-10-20 DIAGNOSIS — M5414 Radiculopathy, thoracic region: Secondary | ICD-10-CM | POA: Diagnosis not present

## 2018-10-20 DIAGNOSIS — M9904 Segmental and somatic dysfunction of sacral region: Secondary | ICD-10-CM | POA: Diagnosis not present

## 2018-10-20 DIAGNOSIS — E785 Hyperlipidemia, unspecified: Secondary | ICD-10-CM

## 2018-10-20 DIAGNOSIS — M9903 Segmental and somatic dysfunction of lumbar region: Secondary | ICD-10-CM | POA: Diagnosis not present

## 2018-10-20 NOTE — Progress Notes (Signed)
Subjective:   Patient ID: Larry Edwards, male    DOB: 10-Aug-1965, 53 y.o.   MRN: [id number]  Larry Edwards is a pleasant 53 y.o. year old male who presents to clinic today with Annual Exam (Pt is here today for a CPE. Last labs completed on 9.30.20 have been printed for in person review.Marland Kitchen He is needing a foot exam.)  on 10/24/2018  HPI:  Health Maintenance  Topic Date Due  . INFLUENZA VACCINE  04/12/2026 (Originally 08/13/2018)  . OPHTHALMOLOGY EXAM  01/21/2019  . HEMOGLOBIN A1C  03/28/2019  . COLONOSCOPY  10/30/2021  . TETANUS/TDAP  10/19/2027  . PNEUMOCOCCAL POLYSACCHARIDE VACCINE AGE 60-64 HIGH RISK  Completed  . FOOT EXAM  Discontinued  . HIV Screening  Discontinued    Last saw Larry Edwards on 09/28/18 for arthralgias and myalgias.  Note reviewed- symptoms had started several months prior and were progressing- no known injury.  By the time I saw him on 09/28/18, he had arthralgias and or myalgias in his arms, elbows, shoulders and in his hamstrings- all bilateral.  He had been taking Crestor 5 mg nightly.    I advised cutting his Crestor back to 5 mg three times weekly from nightly and to add coQ 10 as LDL was only 75.  Also check labs.  Most pain is better but pain in bilateral buttocks is not any better.  Worst after sitting for long periods of time.  CK was quite high at 329.  SED rate, Vit D, Rheumatoid factor, liver function, kidney function, electrolytes all unremarkable. The 10-year ASCVD risk score Larry Bussing DC Brooke Bonito., et al., 2013) is: 16.5%   Values used to calculate the score:     Age: 53 years     Sex: Male     Is Non-Hispanic African American: No     Diabetic: Yes     Tobacco smoker: No     Systolic Blood Pressure: 123456 mmHg     Is BP treated: Yes     HDL Cholesterol: 29 mg/dL     Total Cholesterol: 179 mg/dL  Unfortunately CK has continued to increase despite this change although he does feel better.- advised to cut his crestor to 2.5 mg daily twice weekly.   Lab Results  Component Value Date   CKTOTAL 381 (H) 10/12/2018   Lab Results  Component Value Date   ALT 28 09/28/2018   AST 21 09/28/2018   ALKPHOS 45 09/28/2018   BILITOT 0.4 09/28/2018   Lab Results  Component Value Date   PSA 0.49 10/09/2016   PSA 0.50 10/08/2015   PSA 0.66 10/04/2014     HLD- LDL deteriorated- not at goal for a diabetic. Likely due to increased increased animal fats with new diet.  Lab Results  Component Value Date   CHOL 179 10/12/2018   HDL 29 (L) 10/12/2018   LDLCALC 90 10/12/2018   LDLDIRECT 75.0 09/28/2018   TRIG 364 (H) 10/12/2018   CHOLHDL 6.2 (H) 10/12/2018   Lab Results  Component Value Date   ALT 28 09/28/2018   AST 21 09/28/2018   ALKPHOS 45 09/28/2018   BILITOT 0.4 09/28/2018    Diabetes- on Metformin 1000 mg twice daily.  Lab Results  Component Value Date   HGBA1C 6.8 (H) 09/28/2018    Does not check his FSBS.  Denies any symptoms of hypoglycemia. Denies increased thirst or urination.  Wt Readings from Last 3 Encounters:  10/24/18 285 lb 12.8 oz (129.6 kg)  09/28/18 281  lb (127.5 kg)  01/20/18 289 lb 3.2 oz (131.2 kg)     Anxiety better controlled with Lexapro 20 mg daily.  Denies any current symptoms of depression or anxiety.  Sleeping well.     Depression screen [facility] 2/9 10/24/2018 10/18/2017 08/13/2016 10/11/2014  Decreased Interest 0 0 - 0  Down, Depressed, Hopeless 1 0 0 0  PHQ - 2 Score 1 0 0 0  Altered sleeping 0 - - -  Tired, decreased energy 1 - - -  Change in appetite 0 - - -  Feeling bad or failure about yourself  0 - - -  Trouble concentrating 0 - - -  Moving slowly or fidgety/restless 0 - - -  Suicidal thoughts 0 - - -  PHQ-9 Score 2 - - -  Difficult doing work/chores Somewhat difficult - - -     HTN- controlled on atenolol 50 mg daily. Denies CP, SOB, HA visual changes or LE edema.  Lab Results  Component Value Date   CREATININE 0.99 09/28/2018   Lab Results  Component Value Date   PSA  0.49 10/09/2016   PSA 0.50 10/08/2015   PSA 0.66 10/04/2014     Current Outpatient Medications on File Prior to Visit  Medication Sig Dispense Refill  . atenolol (TENORMIN) 50 MG tablet TAKE 1 TABLET DAILY 90 tablet 3  . cholecalciferol (VITAMIN D) 1000 UNITS tablet Take 1,000 Units by mouth 2 (two) times daily.      . Coenzyme Q10 (COQ-10) 100 MG CAPS Take 1 capsule by mouth at bedtime.    Marland Kitchen escitalopram (LEXAPRO) 20 MG tablet TAKE 1 TABLET DAILY 90 tablet 3  . fluticasone (FLONASE) 50 MCG/ACT nasal spray Place 2 sprays into both nostrils daily. 16 g 6  . metFORMIN (GLUCOPHAGE) 1000 MG tablet TAKE 1 TABLET TWICE A DAY WITH MEALS 180 tablet 3  . Omega-3 Fatty Acids (FISH OIL PO) Take by mouth.     No current facility-administered medications on file prior to visit.     No Known Allergies  Past Medical History:  Diagnosis Date  . Diabetes mellitus without complication ([place])   . GERD (gastroesophageal reflux disease)   . Hx of dysplastic nevus 12/03/2011   R shoulder, severe  . Hyperlipidemia   . Hypertension   . Sleep apnea     Past Surgical History:  Procedure Laterality Date  . BACK SURGERY    . CHOLECYSTECTOMY  01/1995  . COLONOSCOPY WITH PROPOFOL N/A 10/30/2016   Procedure: COLONOSCOPY WITH PROPOFOL;  Surgeon: Larry Bellows, MD;  Location: [facility] ENDOSCOPY;  Service: Gastroenterology;  Laterality: N/A;  . LUMBAR [place] SURGERY  05/2004  . TONSILLECTOMY AND ADENOIDECTOMY     age 53    Family History  Problem Relation Age of Onset  . Cancer Mother        ovarian  . Diabetes Mother   . Diabetes Father     Social History   Socioeconomic History  . Marital status: Single    Spouse name: Not on file  . Number of children: Not on file  . Years of education: Not on file  . Highest education level: Not on file  Occupational History  . Not on file  Social Needs  . Financial resource strain: Not on file  . Food insecurity    Worry: Not on file    Inability: Not on  file  . Transportation needs    Medical: Not on file    Non-medical: Not on file  Tobacco Use  . Smoking status: Former Smoker    Quit date: 02/25/2011    Years since quitting: 7.6  . Smokeless tobacco: Former Network engineer and Sexual Activity  . Alcohol use: Yes    Alcohol/week: 0.0 standard drinks    Comment: rare  . Drug use: No  . Sexual activity: Not on file  Lifestyle  . Physical activity    Days per week: Not on file    Minutes per session: Not on file  . Stress: Not on file  Relationships  . Social Herbalist on phone: Not on file    Gets together: Not on file    Attends religious service: Not on file    Active member of club or organization: Not on file    Attends meetings of clubs or organizations: Not on file    Relationship status: Not on file  . Intimate partner violence    Fear of current or ex partner: Not on file    Emotionally abused: Not on file    Physically abused: Not on file    Forced sexual activity: Not on file  Other Topics Concern  . Not on file  Social History Narrative  . Not on file   The PMH, PSH, Social History, Family History, Medications, and allergies have been reviewed in [facility], and have been updated if relevant.   Review of Systems  Constitutional: Negative.   HENT: Negative.   Eyes: Negative.   Respiratory: Negative.   Cardiovascular: Negative.   Gastrointestinal: Negative.   Endocrine: Negative.   Genitourinary: Negative.   Musculoskeletal: Negative.   Allergic/Immunologic: Negative.   Neurological: Negative.   Hematological: Negative.   Psychiatric/Behavioral: Negative.   All other systems reviewed and are negative.      Objective:    BP (!) 144/74 (BP Location: Left Arm, Cuff Size: Large)   Pulse 76   Temp 98.9 F (37.2 C) (Oral)   Ht 5' 9.5" (1.765 m)   Wt 285 lb 12.8 oz (129.6 kg)   SpO2 96%   BMI 41.60 kg/m    Physical Exam  General:  pleasant male in no acute distress Eyes:  PERRL Ears:   External ear exam shows no significant lesions or deformities.  TMs normal bilaterally Hearing is grossly normal bilaterally. Nose:  External nasal examination shows no deformity or inflammation. Nasal mucosa are pink and moist without lesions or exudates. Mouth:  Oral mucosa and oropharynx without lesions or exudates.  Teeth in good repair. Neck:  no carotid bruit or thyromegaly no cervical or supraclavicular lymphadenopathy  Lungs:  Normal respiratory effort, chest expands symmetrically. Lungs are clear to auscultation, no crackles or wheezes. Heart:  Normal rate and regular rhythm. S1 and S2 normal without gallop, murmur, click, rub or other extra sounds. Abdomen:  Bowel sounds positive,abdomen soft and non-tender without masses, organomegaly or hernias noted. Pulses:  R and L posterior tibial pulses are full and equal bilaterally  Extremities:  no edema  TTP over SI joint, worse with fabers Psych:  Good eye contact, not anxious or depressed appearing Neuro- bilateral upper and lower extremity strength normal and symmetrical, normal reflexes       Assessment & Plan:   Visit for well man health check  Obesity, Class III, BMI 40-49.9 (morbid obesity) ([place]), Chronic  Malignant melanoma of skin of trunk, except scrotum ([place]), Chronic  Type 2 diabetes mellitus without complication, without long-term current use of insulin ([facility])  Essential hypertension  Vitamin D deficiency  Hyperlipidemia, unspecified hyperlipidemia type - Plan: Lipid panel  Elevated CK - Plan: CK Total (and CKMB), Lactate Dehydrogenase (LDH), TSH, Phosphorus, Comprehensive metabolic panel, Aldolase No follow-ups on file.

## 2018-10-20 NOTE — Progress Notes (Signed)
Pt said he would get it done at [facility] in a few weeks

## 2018-10-21 MED ORDER — ROSUVASTATIN CALCIUM 5 MG PO TABS: 2.5000 mg | ORAL_TABLET | ORAL | 0 refills | Status: DC

## 2018-10-24 ENCOUNTER — Other Ambulatory Visit: Payer: Self-pay

## 2018-10-24 ENCOUNTER — Encounter: Payer: Self-pay | Admitting: Family Medicine

## 2018-10-24 ENCOUNTER — Ambulatory Visit (INDEPENDENT_AMBULATORY_CARE_PROVIDER_SITE_OTHER): Payer: BC Managed Care – PPO | Admitting: Family Medicine

## 2018-10-24 VITALS — BP 144/74 | HR 76 | Temp 98.9°F | Ht 69.5 in | Wt 285.8 lb

## 2018-10-24 DIAGNOSIS — R748 Abnormal levels of other serum enzymes: Secondary | ICD-10-CM | POA: Diagnosis not present

## 2018-10-24 DIAGNOSIS — E785 Hyperlipidemia, unspecified: Secondary | ICD-10-CM | POA: Diagnosis not present

## 2018-10-24 DIAGNOSIS — E119 Type 2 diabetes mellitus without complications: Secondary | ICD-10-CM

## 2018-10-24 DIAGNOSIS — Z125 Encounter for screening for malignant neoplasm of prostate: Secondary | ICD-10-CM | POA: Diagnosis not present

## 2018-10-24 DIAGNOSIS — M533 Sacrococcygeal disorders, not elsewhere classified: Secondary | ICD-10-CM

## 2018-10-24 DIAGNOSIS — M791 Myalgia, unspecified site: Secondary | ICD-10-CM | POA: Diagnosis not present

## 2018-10-24 DIAGNOSIS — I10 Essential (primary) hypertension: Secondary | ICD-10-CM | POA: Diagnosis not present

## 2018-10-24 DIAGNOSIS — C4359 Malignant melanoma of other part of trunk: Secondary | ICD-10-CM

## 2018-10-24 DIAGNOSIS — Z0000 Encounter for general adult medical examination without abnormal findings: Secondary | ICD-10-CM

## 2018-10-24 DIAGNOSIS — E559 Vitamin D deficiency, unspecified: Secondary | ICD-10-CM | POA: Diagnosis not present

## 2018-10-24 LAB — PSA: PSA: 0.42 ng/mL (ref 0.10–4.00)

## 2018-10-24 LAB — LIPID PANEL
Cholesterol: 200 mg/dL (ref 0–200)
HDL: 32.1 mg/dL — ABNORMAL LOW (ref >39.00)
Total CHOL/HDL Ratio: 6
Triglycerides: 519 mg/dL — ABNORMAL HIGH (ref 0.0–149.0)

## 2018-10-24 LAB — COMPREHENSIVE METABOLIC PANEL
ALT: 30 U/L (ref 0–53)
AST: 22 U/L (ref 0–37)
Albumin: 4.8 g/dL (ref 3.5–5.2)
Alkaline Phosphatase: 50 U/L (ref 39–117)
BUN: 17 mg/dL (ref 6–23)
CO2: 23 mEq/L (ref 19–32)
Calcium: 10.1 mg/dL (ref 8.4–10.5)
Chloride: 103 mEq/L (ref 96–112)
Creatinine, Ser: 0.95 mg/dL (ref 0.40–1.50)
GFR: 83.08 mL/min (ref >60.00)
Glucose, Bld: 133 mg/dL — ABNORMAL HIGH (ref 70–99)
Potassium: 4.1 mEq/L (ref 3.5–5.1)
Sodium: 138 mEq/L (ref 135–145)
Total Bilirubin: 0.3 mg/dL (ref 0.2–1.2)
Total Protein: 7.1 g/dL (ref 6.0–8.3)

## 2018-10-24 LAB — VITAMIN D 25 HYDROXY (VIT D DEFICIENCY, FRACTURES): VITD: 28.02 ng/mL — ABNORMAL LOW (ref 30.00–100.00)

## 2018-10-24 LAB — PHOSPHORUS: Phosphorus: 3.5 mg/dL (ref 2.3–4.6)

## 2018-10-24 LAB — LDL CHOLESTEROL, DIRECT: Direct LDL: 107 mg/dL

## 2018-10-24 LAB — TSH: TSH: 1.28 u[IU]/mL (ref 0.35–4.50)

## 2018-10-24 NOTE — Patient Instructions (Addendum)
Great to see you. I will call you with your lab results from today and you can view them online.   Let me know about Tricor/fenobriate- 54 mg daily 48 mg daily  We don't have to as high as 160.  We are checking labs again in 2 weeks.  Follow exercises and support for SI joint pain.  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   We are repeating your labs in 2 months.

## 2018-10-24 NOTE — Assessment & Plan Note (Signed)
Advised Ibuprofen 800 mg three times daily with food x 2 weeks and handout given from sports medicine advisor with additional supportive care and exercises. He will keep me updated.

## 2018-10-24 NOTE — Assessment & Plan Note (Signed)
Well controlled.  No changes made. 

## 2018-10-24 NOTE — Assessment & Plan Note (Addendum)
Assumed secondary to statin.  We just decreased statin to crestor 2.5 mg twice weekly, will check additional labs today.  Add fenofibrate (he wants to call me about about fenofibrate coverage before I send it in). Does not appear to be a neuromuscular cause (neuro exam normal today). But will refer to neuro if new symptoms develop or CK does not trend down. The patient indicates understanding of these issues and agrees with the plan. Orders Placed This Encounter  Procedures  . Comprehensive metabolic panel  . Lipid panel  . Phosphorus  . Lactate Dehydrogenase (LDH)  . CK Total (and CKMB)  . Aldolase  . TSH  . PSA  . Vitamin D (25 hydroxy)

## 2018-10-24 NOTE — Assessment & Plan Note (Signed)
Has been well controlled on current Metformin. No changes made. Labs today

## 2018-10-24 NOTE — Progress Notes (Signed)
Future orders placed and printed per TA for pt to get completed at [facility]/thx dmf

## 2018-10-24 NOTE — Assessment & Plan Note (Signed)
Reviewed preventive care protocols, scheduled due services, and updated immunizations Discussed nutrition, exercise, diet, and healthy lifestyle.  

## 2018-10-24 NOTE — Assessment & Plan Note (Signed)
Continue crestor 2.5 mg twice weekly and will add fenofibrate after he calls me back.

## 2018-10-25 ENCOUNTER — Other Ambulatory Visit: Payer: Self-pay

## 2018-10-25 MED ORDER — FENOFIBRATE 134 MG PO CAPS: 134.0000 mg | ORAL_CAPSULE | Freq: Every day | ORAL | 3 refills | Status: DC

## 2018-10-25 NOTE — Progress Notes (Signed)
Rx for Fenofibrate 137mg  1qd #90+3 sent to [facility]/thx dmf

## 2018-10-26 DIAGNOSIS — G4733 Obstructive sleep apnea (adult) (pediatric): Secondary | ICD-10-CM | POA: Diagnosis not present

## 2018-10-26 LAB — ALDOLASE

## 2018-10-26 LAB — LACTATE DEHYDROGENASE: LDH: 126 U/L (ref 120–250)

## 2018-10-26 LAB — CK TOTAL AND CKMB (NOT AT ARMC)
CK, MB: 4.8 ng/mL (ref 0–5.0)
Relative Index: 1.4 (ref 0–4.0)
Total CK: 344 U/L — ABNORMAL HIGH (ref 44–196)

## 2018-10-27 ENCOUNTER — Other Ambulatory Visit: Payer: Self-pay | Admitting: Family Medicine

## 2018-10-27 MED ORDER — VITAMIN D (ERGOCALCIFEROL) 1.25 MG (50000 UNIT) PO CAPS: 50000.0000 [IU] | ORAL_CAPSULE | ORAL | 0 refills | Status: DC

## 2018-11-07 DIAGNOSIS — R748 Abnormal levels of other serum enzymes: Secondary | ICD-10-CM | POA: Diagnosis not present

## 2018-11-07 DIAGNOSIS — I10 Essential (primary) hypertension: Secondary | ICD-10-CM | POA: Diagnosis not present

## 2018-11-07 DIAGNOSIS — E119 Type 2 diabetes mellitus without complications: Secondary | ICD-10-CM | POA: Diagnosis not present

## 2018-11-17 ENCOUNTER — Encounter: Payer: Self-pay | Admitting: Family Medicine

## 2018-11-24 ENCOUNTER — Other Ambulatory Visit: Payer: Self-pay | Admitting: Family Medicine

## 2018-11-24 DIAGNOSIS — Z9889 Other specified postprocedural states: Secondary | ICD-10-CM

## 2018-11-24 DIAGNOSIS — M5116 Intervertebral disc disorders with radiculopathy, lumbar region: Secondary | ICD-10-CM

## 2018-11-26 ENCOUNTER — Ambulatory Visit
Admission: RE | Admit: 2018-11-26 | Discharge: 2018-11-26 | Disposition: A | Discharge status: Home / Self Care | Payer: BC Managed Care – PPO | Source: Ambulatory Visit | Attending: Family Medicine | Admitting: Family Medicine

## 2018-11-26 ENCOUNTER — Encounter: Payer: Self-pay | Admitting: Family Medicine

## 2018-11-26 ENCOUNTER — Other Ambulatory Visit: Payer: Self-pay

## 2018-11-26 DIAGNOSIS — M545 Low back pain: Secondary | ICD-10-CM | POA: Diagnosis not present

## 2018-11-26 DIAGNOSIS — Z9889 Other specified postprocedural states: Secondary | ICD-10-CM | POA: Insufficient documentation

## 2018-11-26 DIAGNOSIS — M5116 Intervertebral disc disorders with radiculopathy, lumbar region: Secondary | ICD-10-CM | POA: Diagnosis not present

## 2018-11-29 DIAGNOSIS — D485 Neoplasm of uncertain behavior of skin: Secondary | ICD-10-CM | POA: Diagnosis not present

## 2018-11-29 DIAGNOSIS — L905 Scar conditions and fibrosis of skin: Secondary | ICD-10-CM | POA: Diagnosis not present

## 2018-11-29 DIAGNOSIS — Z1283 Encounter for screening for malignant neoplasm of skin: Secondary | ICD-10-CM | POA: Diagnosis not present

## 2018-11-29 DIAGNOSIS — M48062 Spinal stenosis, lumbar region with neurogenic claudication: Secondary | ICD-10-CM | POA: Diagnosis not present

## 2018-11-29 DIAGNOSIS — D225 Melanocytic nevi of trunk: Secondary | ICD-10-CM | POA: Diagnosis not present

## 2018-11-29 DIAGNOSIS — D1800 Hemangioma unspecified site: Secondary | ICD-10-CM | POA: Diagnosis not present

## 2018-11-29 DIAGNOSIS — C4491 Basal cell carcinoma of skin, unspecified: Secondary | ICD-10-CM

## 2018-11-29 DIAGNOSIS — C44519 Basal cell carcinoma of skin of other part of trunk: Secondary | ICD-10-CM | POA: Diagnosis not present

## 2018-11-29 DIAGNOSIS — C44612 Basal cell carcinoma of skin of right upper limb, including shoulder: Secondary | ICD-10-CM | POA: Diagnosis not present

## 2018-11-29 HISTORY — DX: Basal cell carcinoma of skin, unspecified: C44.91

## 2018-12-02 ENCOUNTER — Encounter: Payer: Self-pay | Admitting: Family Medicine

## 2018-12-05 DIAGNOSIS — M2578 Osteophyte, vertebrae: Secondary | ICD-10-CM | POA: Diagnosis not present

## 2018-12-05 DIAGNOSIS — M48062 Spinal stenosis, lumbar region with neurogenic claudication: Secondary | ICD-10-CM | POA: Diagnosis not present

## 2018-12-19 ENCOUNTER — Encounter: Payer: Self-pay | Admitting: Family Medicine

## 2018-12-19 ENCOUNTER — Other Ambulatory Visit: Payer: Self-pay

## 2018-12-19 ENCOUNTER — Other Ambulatory Visit: Payer: Self-pay | Admitting: Family Medicine

## 2018-12-19 DIAGNOSIS — E559 Vitamin D deficiency, unspecified: Secondary | ICD-10-CM

## 2018-12-19 NOTE — Progress Notes (Signed)
Printed future orders and created vit-[facility] [place] order/pt will go to [facility] to complete/thx dmf

## 2018-12-20 ENCOUNTER — Telehealth: Payer: BC Managed Care – PPO | Admitting: Physician Assistant

## 2018-12-20 DIAGNOSIS — T783XXA Angioneurotic edema, initial encounter: Secondary | ICD-10-CM | POA: Diagnosis not present

## 2018-12-20 MED ORDER — PREDNISONE 10 MG PO TABS: ORAL_TABLET | ORAL | 0 refills | Status: DC

## 2018-12-20 NOTE — Progress Notes (Signed)
E Visit for Rash  We are sorry that you are not feeling well.  Here is how we plan to help! Be sure to notify your PCP.  Occasionally prescription medications can have this side effect.   Continue taking an over the counter antihistamine.  You can try cetirizine up to 20mg  twice daily.   I have prescribed Prednisone 10 mg daily for 6 days (see taper instructions below)  Directions for 6 day taper: Day 1: 2 tablets before breakfast, 1 after both lunch & dinner and 2 at bedtime Day 2: 1 tab before breakfast, 1 after both lunch & dinner and 2 at bedtime Day 3: 1 tab at each meal & 1 at bedtime Day 4: 1 tab at breakfast, 1 at lunch, 1 at bedtime Day 5: 1 tab at breakfast & 1 tab at bedtime Day 6: 1 tab at breakfast   HOME CARE:   Take cool showers and avoid direct sunlight.  Apply cool compress or wet dressings.  Take a bath in an oatmeal bath.  Sprinkle content of one Aveeno packet under running faucet with comfortably warm water.  Bathe for 15-20 minutes, 1-2 times daily.  Pat dry with a towel. Do not rub the rash.  Use hydrocortisone cream.  Take an antihistamine like Benadryl for widespread rashes that itch.  The adult dose of Benadryl is 25-50 mg by mouth 4 times daily.  Caution:  This type of medication may cause sleepiness.  Do not drink alcohol, drive, or operate dangerous machinery while taking antihistamines.  Do not take these medications if you have prostate enlargement.  Read package instructions thoroughly on all medications that you take.  GET HELP RIGHT AWAY IF:   Symptoms don't go away after treatment.  Severe itching that persists.  If you rash spreads or swells.  If you rash begins to smell.  If it blisters and opens or develops a yellow-brown crust.  You develop a fever.  You have a sore throat.  You become short of breath.  MAKE SURE YOU:  Understand these instructions. Will watch your condition. Will get help right away if you are not doing  well or get worse.  Thank you for choosing an e-visit. Your e-visit answers were reviewed by a board certified advanced clinical practitioner to complete your personal care plan. Depending upon the condition, your plan could have included both over the counter or prescription medications. Please review your pharmacy choice. Be sure that the pharmacy you have chosen is open so that you can pick up your prescription now.  If there is a problem you may message your provider in [place] to have the prescription routed to another pharmacy. Your safety is important to [place]. If you have drug allergies check your prescription carefully.  For the next 24 hours, you can use MyChart to ask questions about today's visit, request a non-urgent call back, or ask for a work or school excuse from your e-visit provider. You will get an email in the next two days asking about your experience. I hope that your e-visit has been valuable and will speed your recovery.    Greater than 5 minutes, yet less than 10 minutes of time have been spent researching, coordinating and implementing care for this patient today.

## 2018-12-30 DIAGNOSIS — C44612 Basal cell carcinoma of skin of right upper limb, including shoulder: Secondary | ICD-10-CM | POA: Diagnosis not present

## 2018-12-30 DIAGNOSIS — C44519 Basal cell carcinoma of skin of other part of trunk: Secondary | ICD-10-CM | POA: Diagnosis not present

## 2018-12-30 DIAGNOSIS — L821 Other seborrheic keratosis: Secondary | ICD-10-CM | POA: Diagnosis not present

## 2019-01-02 ENCOUNTER — Encounter: Payer: Self-pay | Admitting: Family Medicine

## 2019-01-11 ENCOUNTER — Other Ambulatory Visit: Payer: Self-pay | Admitting: Family Medicine

## 2019-01-11 DIAGNOSIS — R748 Abnormal levels of other serum enzymes: Secondary | ICD-10-CM | POA: Diagnosis not present

## 2019-01-11 DIAGNOSIS — E119 Type 2 diabetes mellitus without complications: Secondary | ICD-10-CM | POA: Diagnosis not present

## 2019-01-12 LAB — COMPREHENSIVE METABOLIC PANEL
ALT: 28 IU/L (ref 0–44)
AST: 21 IU/L (ref 0–40)
Albumin/Globulin Ratio: 2.1 (ref 1.2–2.2)
Albumin: 4.6 g/dL (ref 3.8–4.9)
Alkaline Phosphatase: 44 IU/L (ref 39–117)
BUN/Creatinine Ratio: 15 (ref 9–20)
BUN: 15 mg/dL (ref 6–24)
Bilirubin Total: 0.2 mg/dL (ref 0.0–1.2)
CO2: 22 mmol/L (ref 20–29)
Calcium: 9.3 mg/dL (ref 8.7–10.2)
Chloride: 103 mmol/L (ref 96–106)
Creatinine, Ser: 1.02 mg/dL (ref 0.76–1.27)
GFR calc Af Amer: 97 mL/min/{1.73_m2} (ref >59)
GFR calc non Af Amer: 84 mL/min/{1.73_m2} (ref >59)
Globulin, Total: 2.2 g/dL (ref 1.5–4.5)
Glucose: 131 mg/dL — ABNORMAL HIGH (ref 65–99)
Potassium: 4.4 mmol/L (ref 3.5–5.2)
Sodium: 140 mmol/L (ref 134–144)
Total Protein: 6.8 g/dL (ref 6.0–8.5)

## 2019-01-12 LAB — LIPID PANEL
Chol/HDL Ratio: 5.6 ratio — ABNORMAL HIGH (ref 0.0–5.0)
Cholesterol, Total: 158 mg/dL (ref 100–199)
HDL: 28 mg/dL — ABNORMAL LOW (ref >39)
LDL Chol Calc (NIH): 78 mg/dL (ref 0–99)
Triglycerides: 318 mg/dL — ABNORMAL HIGH (ref 0–149)
VLDL Cholesterol Cal: 52 mg/dL — ABNORMAL HIGH (ref 5–40)

## 2019-01-12 LAB — HEMOGLOBIN A1C
Est. average glucose Bld gHb Est-mCnc: 134 mg/dL
Hgb A1c MFr Bld: 6.3 % — ABNORMAL HIGH (ref 4.8–5.6)

## 2019-01-12 LAB — CK TOTAL AND CKMB (NOT AT ARMC)
CK-MB Index: 5.1 ng/mL (ref 0.0–10.4)
Total CK: 314 U/L (ref 41–331)

## 2019-01-12 LAB — VITAMIN D 25 HYDROXY (VIT D DEFICIENCY, FRACTURES): Vit D, 25-Hydroxy: 28.3 ng/mL — ABNORMAL LOW (ref 30.0–100.0)

## 2019-01-16 ENCOUNTER — Other Ambulatory Visit: Payer: Self-pay

## 2019-01-16 MED ORDER — VITAMIN D (ERGOCALCIFEROL) 1.25 MG (50000 UNIT) PO CAPS: 50000.0000 [IU] | ORAL_CAPSULE | ORAL | 0 refills | Status: DC

## 2019-01-16 NOTE — Telephone Encounter (Signed)
Last OV 10/24/18 Last fill  10/27/18

## 2019-01-17 ENCOUNTER — Encounter: Payer: Self-pay | Admitting: Family Medicine

## 2019-01-19 ENCOUNTER — Ambulatory Visit: Payer: BC Managed Care – PPO | Admitting: Pulmonary Disease

## 2019-01-19 ENCOUNTER — Other Ambulatory Visit: Payer: Self-pay

## 2019-01-19 ENCOUNTER — Encounter: Payer: Self-pay | Admitting: Pulmonary Disease

## 2019-01-19 DIAGNOSIS — G4733 Obstructive sleep apnea (adult) (pediatric): Secondary | ICD-10-CM | POA: Diagnosis not present

## 2019-01-19 NOTE — Progress Notes (Signed)
@Patient  ID: Larry Edwards, male    DOB: 06/13/1965, 54 y.o.   MRN: [id number]  Chief Complaint  Patient presents with  . Follow-up    wearing cpap avg 7-8hr nightly- feels pressure and mask are okay. [id number]    Referring provider: Lucille Passy, MD  HPI:  54 year old male former smoker followed in our office for severe obstructive sleep apnea  PMH: Vitamin D deficiency, hyperlipidemia, hypertension, GERD, anxiety Smoker/ Smoking History: Former smoker.  Quit 2013.  40-pack-year smoking history Maintenance: None Pt of: Former patient of DR  01/19/2019  - Visit   54 year old male former smoker followed our office for severe obstructive sleep apnea.  Patient presenting today as a 1 year follow-up.  Overall patient reports that things have been going well.  CPAP compliance report confirms this:  12/19/2018-01/17/2019-29 on the last 30 days used-all 29 of those days greater than 4 hours, average usage 7 hours and 8 minutes, CPAP set pressure of 18, AHI 1.1   Patient does report that he feels that his machine has been making more noise as of late.  It is about 53 years old.  His DME company is [facility].  Tests:   Review of sleep study testing from 08/30/2014: Severe sleep apnea with an apnea hypopnea index of 63. He also had a spontaneous arousal index of 12.  -CPAP titration study performed on 11/01/2014: Required CPAP of 18, started in a CPAP titration to help with habituation.  FENO:  No results found for: NITRICOXIDE  PFT: No flowsheet data found.  WALK:  No flowsheet data found.  Imaging: No results found.  Lab Results:  CBC    Component Value Date/Time   WBC 7.1 10/14/2017 0848   WBC 6.1 10/09/2016 0809   RBC 5.04 10/14/2017 0848   RBC 4.61 10/09/2016 0809   HGB 13.5 10/14/2017 0848   HCT 42.2 10/14/2017 0848   PLT 274 10/14/2017 0848   MCV 84 10/14/2017 0848   MCH 26.8 10/14/2017 0848   MCHC 32.0 10/14/2017 0848   MCHC 32.6 10/09/2016 0809   RDW 13.4  10/14/2017 0848   LYMPHSABS 2.5 10/14/2017 0848   MONOABS 0.6 10/09/2016 0809   EOSABS 0.2 10/14/2017 0848   BASOSABS 0.1 10/14/2017 0848    BMET    Component Value Date/Time   NA 140 01/11/2019 0819   K 4.4 01/11/2019 0819   CL 103 01/11/2019 0819   CO2 22 01/11/2019 0819   GLUCOSE 131 (H) 01/11/2019 0819   GLUCOSE 133 (H) 10/24/2018 0849   BUN 15 01/11/2019 0819   CREATININE 1.02 01/11/2019 0819   CALCIUM 9.3 01/11/2019 0819   GFRNONAA 84 01/11/2019 0819   GFRAA 97 01/11/2019 0819    BNP No results found for: BNP  ProBNP No results found for: PROBNP  Specialty Problems      Pulmonary Problems   HAY FEVER    Qualifier: Diagnosis of  By: Council Mechanic MD, Hilaria Ota       Chronic rhinitis   OSA (obstructive sleep apnea)    Review of sleep study testing from 08/30/2014: Severe sleep apnea with an apnea hypopnea index of 63. He also had a spontaneous arousal index of 12.  -CPAP titration study performed on 11/01/2014: Required CPAP of 18, started in a CPAP titration to help with habituation.         No Known Allergies  Immunization History  Administered Date(s) Administered  . Pneumococcal Polysaccharide-23 10/14/2015  . Td 07/05/2002  . Tdap  10/18/2017    Past Medical History:  Diagnosis Date  . Diabetes mellitus without complication ([place])   . GERD (gastroesophageal reflux disease)   . Hx of dysplastic nevus 12/03/2011   R shoulder, severe  . Hyperlipidemia   . Hypertension   . Sleep apnea     Tobacco History: Social History   Tobacco Use  Smoking Status Former Smoker  . Packs/day: 2.00  . Years: 20.00  . Pack years: 40.00  . Quit date: 02/25/2011  . Years since quitting: 7.9  Smokeless Tobacco Former Air traffic controller given: Yes   Continue to not smoke  Outpatient Encounter Medications as of 01/19/2019  Medication Sig  . atenolol (TENORMIN) 50 MG tablet TAKE 1 TABLET DAILY  . cholecalciferol (VITAMIN D) 1000 UNITS tablet Take 1,000 Units  by mouth 2 (two) times daily.    . Coenzyme Q10 (COQ-10) 100 MG CAPS Take 1 capsule by mouth at bedtime.  Marland Kitchen escitalopram (LEXAPRO) 20 MG tablet TAKE 1 TABLET DAILY  . fenofibrate micronized (LOFIBRA) 134 MG capsule Take 1 capsule (134 mg total) by mouth daily before breakfast.  . fluticasone (FLONASE) 50 MCG/ACT nasal spray USE 2 SPRAYS IN EACH NOSTRIL DAILY  . metFORMIN (GLUCOPHAGE) 1000 MG tablet TAKE 1 TABLET TWICE A DAY WITH MEALS  . rosuvastatin (CRESTOR) 5 MG tablet TAKE 1 TABLET DAILY AT BEDTIME  . [DISCONTINUED] Omega-3 Fatty Acids (FISH OIL PO) Take by mouth.  . [DISCONTINUED] predniSONE (DELTASONE) 10 MG tablet Day 1: 2 tabs before breakfast, 1 after both lunch & dinner, 2 at bedtime; Day 2: 1 tab before breakfast, 1 after both lunch & dinner and 2 at bedtime; Day 3: 1 tab at each meal & 1 at bedtime; Day 4: 1 tab at breakfast, 1 at lunch, 1 at bedtime; Day 5: 1 tab at breakfast & 1 tab at bedtime; Day 6: 1 tab at breakfast  . [DISCONTINUED] Vitamin D, Ergocalciferol, (DRISDOL) 1.25 MG (50000 UT) CAPS capsule Take 1 capsule (50,000 Units total) by mouth every 7 (seven) days.   No facility-administered encounter medications on file as of 01/19/2019.     Review of Systems  Review of Systems  Constitutional: Negative for activity change, chills, fatigue, fever and unexpected weight change.  HENT: Negative for postnasal drip, rhinorrhea, sinus pressure, sinus pain and sore throat.   Eyes: Negative.   Respiratory: Negative for cough, shortness of breath and wheezing.   Cardiovascular: Negative for chest pain and palpitations.  Gastrointestinal: Negative for constipation, diarrhea, nausea and vomiting.  Endocrine: Negative.   Genitourinary: Negative.   Musculoskeletal: Negative.   Skin: Negative.   Neurological: Negative for dizziness and headaches.  Psychiatric/Behavioral: Negative.  Negative for dysphoric mood. The patient is not nervous/anxious.   All other systems reviewed and are  negative.    Physical Exam  BP 140/82 (BP Location: Left Arm, Cuff Size: Normal)   Pulse 74   Temp 98 F (36.7 C) (Temporal)   Ht 5\' 10"  (1.778 m)   Wt 293 lb 12.8 oz (133.3 kg)   SpO2 97%   BMI 42.16 kg/m   Wt Readings from Last 5 Encounters:  01/19/19 293 lb 12.8 oz (133.3 kg)  10/24/18 285 lb 12.8 oz (129.6 kg)  09/28/18 281 lb (127.5 kg)  01/20/18 289 lb 3.2 oz (131.2 kg)  10/18/17 279 lb 3.2 oz (126.6 kg)    BMI Readings from Last 5 Encounters:  01/19/19 42.16 kg/m  10/24/18 41.60 kg/m  09/28/18 40.90 kg/m  01/20/18  42.10 kg/m  10/18/17 40.64 kg/m     Physical Exam Vitals and nursing note reviewed.  Constitutional:      General: He is not in acute distress.    Appearance: Normal appearance. He is obese.  HENT:     Head: Normocephalic and atraumatic.     Right Ear: Hearing and external ear normal.     Left Ear: Hearing and external ear normal.     Nose: No mucosal edema.     Right Turbinates: Not enlarged.     Left Turbinates: Not enlarged.  Eyes:     Pupils: Pupils are equal, round, and reactive to light.  Cardiovascular:     Rate and Rhythm: Normal rate and regular rhythm.     Pulses: Normal pulses.     Heart sounds: Normal heart sounds. No murmur.  Pulmonary:     Effort: Pulmonary effort is normal.     Breath sounds: Normal breath sounds. No decreased breath sounds, wheezing or rales.  Musculoskeletal:     Cervical back: Normal range of motion.     Right lower leg: No edema.     Left lower leg: No edema.  Lymphadenopathy:     Cervical: No cervical adenopathy.  Skin:    General: Skin is warm and dry.     Capillary Refill: Capillary refill takes less than 2 seconds.     Findings: No erythema or rash.  Neurological:     General: No focal deficit present.     Mental Status: He is alert and oriented to person, place, and time.     Motor: No weakness.     Coordination: Coordination normal.     Gait: Gait is intact. Gait normal.  Psychiatric:         Mood and Affect: Mood normal.        Behavior: Behavior normal. Behavior is cooperative.        Thought Content: Thought content normal.        Judgment: Judgment normal.       Assessment & Plan:   OSA (obstructive sleep apnea) Plan: Continue CPAP therapy Prescription sent to [facility] for them to, on service patient's current machine Prescription also provided for the patient to obtain a new CPAP machine as well as supplies from [email or web address] if he does decide to do so Follow-up in 1 year    Return in about 1 year (around 01/19/2020), or if symptoms worsen or fail to improve, for [facility]   Lauraine Rinne, NP 01/19/2019   This appointment required 22 minutes of patient care (this includes precharting, chart review, review of results, face-to-face care, etc.).

## 2019-01-19 NOTE — Assessment & Plan Note (Signed)
Plan: Continue CPAP therapy Prescription sent to [facility] for them to, on service patient's current machine Prescription also provided for the patient to obtain a new CPAP machine as well as supplies from [email or web address] if he does decide to do so Follow-up in 1 year

## 2019-01-19 NOTE — Patient Instructions (Addendum)
You were seen today by Lauraine Rinne, NP  for:   1. OSA (obstructive sleep apnea)  Printed prescription for [email or web address] CPAP set pressure of 18 Supplies Mask of choice  Order sent today for your DME company adapt to evaluate the noise is coming from your CPAP machine  We recommend that you continue using your CPAP daily >>>Keep up the hard work using your device >>> Goal should be wearing this for the entire night that you are sleeping, at least 4 to 6 hours  Remember:  . Do not drive or operate heavy machinery if tired or drowsy.  . Please notify the supply company and office if you are unable to use your device regularly due to missing supplies or machine being broken.  . Work on maintaining a healthy weight and following your recommended nutrition plan  . Maintain proper daily exercise and movement  . Maintaining proper use of your device can also help improve management of other chronic illnesses such as: Blood pressure, blood sugars, and weight management.   BiPAP/ CPAP Cleaning:  >>>Clean weekly, with Dawn soap, and bottle brush.  Set up to air dry. >>> Wipe mask out daily with wet wipe or towelette      Follow Up:    Return in about 1 year (around 01/19/2020), or if symptoms worsen or fail to improve, for [facility].   Please do your part to reduce the spread of COVID-19:      Reduce your risk of any infection  and COVID19 by using the similar precautions used for avoiding the common cold or flu:  Marland Kitchen Wash your hands often with soap and warm water for at least 20 seconds.  If soap and water are not readily available, use an alcohol-based hand sanitizer with at least 60% alcohol.  . If coughing or sneezing, cover your mouth and nose by coughing or sneezing into the elbow areas of your shirt or coat, into a tissue or into your sleeve (not your hands). Langley Gauss A MASK when in public  . Avoid shaking hands with others and consider head nods or verbal  greetings only. . Avoid touching your eyes, nose, or mouth with unwashed hands.  . Avoid close contact with people who are sick. . Avoid places or events with large numbers of people in one location, like concerts or sporting events. . If you have some symptoms but not all symptoms, continue to monitor at home and seek medical attention if your symptoms worsen. . If you are having a medical emergency, call 911.   [place] / e-Visit: [email or web address]         [facility]: [facility]: [id number]                   [facility]: [id number]     It is flu season:   >>> Best ways to protect herself from the flu: Receive the yearly flu vaccine, practice good hand hygiene washing with soap and also using hand sanitizer when available, eat a nutritious meals, get adequate rest, hydrate appropriately   Please contact the office if your symptoms worsen or you have concerns that you are not improving.   Thank you for choosing [facility] for your healthcare, and for allowing [place] to partner with you on your healthcare journey. I am thankful to be able to provide care to you today.  Wyn Quaker FNP-C

## 2019-01-20 NOTE — Progress Notes (Signed)
Reviewed and agree with assessment/plan.   Axyl Sitzman, MD [facility] 01/08/2016, 12:24 PM Pager:  [phone]  

## 2019-01-25 DIAGNOSIS — G4733 Obstructive sleep apnea (adult) (pediatric): Secondary | ICD-10-CM | POA: Diagnosis not present

## 2019-03-13 ENCOUNTER — Telehealth: Payer: Self-pay | Admitting: General Practice

## 2019-03-13 NOTE — Telephone Encounter (Signed)
Patient wife is calling and stated that patient came in the office on 01/11/19 for labs that was for drawn for an illness and was coded for a preventive. Wife asked if the diagnosis code could be changed to an illness. Pls advise. CB is [phone]

## 2019-03-24 ENCOUNTER — Ambulatory Visit: Payer: BC Managed Care – PPO | Attending: Internal Medicine

## 2019-03-24 DIAGNOSIS — Z23 Encounter for immunization: Secondary | ICD-10-CM

## 2019-03-24 NOTE — Progress Notes (Signed)
   Covid-19 Vaccination Clinic  Name:  JAMYSON MITCHAM    MRN: [id number] DOB: 1965/06/23  03/24/2019  Mr. Litalien was observed post Covid-19 immunization for 15 minutes without incident. He was provided with Vaccine Information Sheet and instruction to access the V-Safe system.   Mr. Piotrowski was instructed to call 911 with any severe reactions post vaccine: Marland Kitchen Difficulty breathing  . Swelling of face and throat  . A fast heartbeat  . A bad rash all over body  . Dizziness and weakness   Immunizations Administered    Name Date Dose VIS Date Route   Moderna COVID-19 Vaccine 03/24/2019  8:24 AM 0.5 mL 12/13/2018 Intramuscular   Manufacturer: Moderna   Lot: [id number]   [place]:[id number]

## 2019-04-05 ENCOUNTER — Ambulatory Visit (INDEPENDENT_AMBULATORY_CARE_PROVIDER_SITE_OTHER): Payer: BC Managed Care – PPO | Admitting: Family Medicine

## 2019-04-05 ENCOUNTER — Encounter: Payer: Self-pay | Admitting: Family Medicine

## 2019-04-05 ENCOUNTER — Other Ambulatory Visit: Payer: Self-pay

## 2019-04-05 VITALS — BP 130/62 | HR 70 | Temp 97.9°F | Resp 24 | Ht 70.0 in | Wt 287.2 lb

## 2019-04-05 DIAGNOSIS — E785 Hyperlipidemia, unspecified: Secondary | ICD-10-CM

## 2019-04-05 DIAGNOSIS — E119 Type 2 diabetes mellitus without complications: Secondary | ICD-10-CM

## 2019-04-05 DIAGNOSIS — M130 Polyarthritis, unspecified: Secondary | ICD-10-CM | POA: Diagnosis not present

## 2019-04-05 DIAGNOSIS — I10 Essential (primary) hypertension: Secondary | ICD-10-CM

## 2019-04-05 LAB — HIGH SENSITIVITY CRP: CRP, High Sensitivity: 1.58 mg/L (ref 0.000–5.000)

## 2019-04-05 LAB — SEDIMENTATION RATE: Sed Rate: 17 mm/hr (ref 0–20)

## 2019-04-05 NOTE — Assessment & Plan Note (Signed)
Encouraged eye exam. Encouraged diet. Cont metformin, good control on last check.

## 2019-04-05 NOTE — Assessment & Plan Note (Signed)
Pt with b/l joint pain in several joints. Discussed that weight loss would likely help with pain. Lower suspicion for statin induced as not endorsing muscle symptoms. Will get ANA and inflammatory markers (previous RF normal). If elevated - refer to Rheum. If normal - suspect osteoarthritis and recommended trial of Tumeric for pain control

## 2019-04-05 NOTE — Progress Notes (Signed)
Subjective:     Larry Edwards is a 54 y.o. male presenting for Transfer of Care (from Dr Deborra Medina), Medication Management (discuss fenofibrate and Crestor), and Joint issues (all over-thought it was from Crestor but not sure now)     HPI   #HTN - taking atenolol 50 mg for blood pressure - no cp, sob, ha - does not check at home - but has a monitor   #Joint pain - ankles, knees, elbows, shoulders - mild pain - bilateral - worse with activity - wearing knee braces at home to help  - endorses some hand pain and cramping #HLD - was having joint pain and had the CK checked - decreased crestor w/ improvement - taking crestor Monday and Friday - fenofibrate every day - was taking Coenzyme Q10  - was on the fenofibrate initially but then started the crestor - has not tried other statin medication  Lab Results  Component Value Date   CHOL 158 01/11/2019   HDL 28 (L) 01/11/2019   LDLCALC 78 01/11/2019   LDLDIRECT 107.0 10/24/2018   TRIG 318 (H) 01/11/2019   CHOLHDL 5.6 (H) 01/11/2019   The 10-year ASCVD risk score Mikey Bussing DC Jr., et al., 2013) is: 12.3%   Values used to calculate the score:     Age: 54 years     Sex: Male     Is Non-Hispanic African American: No     Diabetic: Yes     Tobacco smoker: No     Systolic Blood Pressure: [id number] mmHg     Is BP treated: Yes     HDL Cholesterol: 28 mg/dL     Total Cholesterol: 158 mg/dL  Lab Results  Component Value Date   CKTOTAL 314 01/11/2019   CKMB 4.8 10/24/2018   CKMBINDEX 5.1 01/11/2019      #Diabetes - needs to schedule eye exam - taking metformin 1000 mg BID Lab Results  Component Value Date   HGBA1C 6.3 (H) 01/11/2019   #Low Vitamin D - seems to stay the same level - did the mega dose x 1-2 months - w/o change  Review of Systems   Social History   Tobacco Use  Smoking Status Former Smoker  . Packs/day: 2.00  . Years: 20.00  . Pack years: 40.00  . Types: Cigarettes  . Quit date: 02/25/2011  .  Years since quitting: 8.1  Smokeless Tobacco Former User        Objective:    BP Readings from Last 3 Encounters:  04/05/19 130/62  01/19/19 140/82  10/24/18 (!) 144/74   Wt Readings from Last 3 Encounters:  04/05/19 287 lb 4 oz (130.3 kg)  01/19/19 293 lb 12.8 oz (133.3 kg)  10/24/18 285 lb 12.8 oz (129.6 kg)    BP 130/62   Pulse 70   Temp 97.9 F (36.6 C)   Resp (!) 24   Ht 5\' 10"  (1.778 m)   Wt 287 lb 4 oz (130.3 kg)   SpO2 96%   BMI 41.22 kg/m    Physical Exam Constitutional:      Appearance: Normal appearance. He is not ill-appearing or diaphoretic.  HENT:     Right Ear: External ear normal.     Left Ear: External ear normal.     Nose: Nose normal.  Eyes:     General: No scleral icterus.    Extraocular Movements: Extraocular movements intact.     Conjunctiva/sclera: Conjunctivae normal.  Cardiovascular:     Rate and Rhythm:  Normal rate and regular rhythm.     Heart sounds: No murmur.  Pulmonary:     Effort: Pulmonary effort is normal. No respiratory distress.     Breath sounds: Normal breath sounds. No wheezing.  Musculoskeletal:     Cervical back: Neck supple.  Skin:    General: Skin is warm and dry.  Neurological:     Mental Status: He is alert. Mental status is at baseline.  Psychiatric:        Mood and Affect: Mood normal.           Assessment & Plan:   Problem List Items Addressed This Visit      Cardiovascular and Mediastinum   Essential hypertension - Primary    BP at goal. Discussed with diabetes could consider switching to lisinopril. Will continue atenolol for now.         Endocrine   Diabetes ([place])    Encouraged eye exam. Encouraged diet. Cont metformin, good control on last check.         Musculoskeletal and Integument   Polyarthritis    Pt with b/l joint pain in several joints. Discussed that weight loss would likely help with pain. Lower suspicion for statin induced as not endorsing muscle symptoms. Will get ANA and  inflammatory markers (previous RF normal). If elevated - refer to Rheum. If normal - suspect osteoarthritis and recommended trial of Tumeric for pain control       Relevant Orders   Sedimentation rate   High sensitivity CRP   ANA     Other   HLD (hyperlipidemia)    Trial of full dose of Crestor to see if that impacts joint pain. Stop fenofibrate. If joint pain worsens will change statin medication. If still not tolerated may either try higher dose of Coenzyme Q10 300 mg daily or return to current regimen.           Return in about 3 months (around 07/06/2019).  Lesleigh Noe, MD

## 2019-04-05 NOTE — Patient Instructions (Addendum)
1) Start taking Crestor every day - monitor for symtpoms - stop fenofibrate - after 2 weeks can do Tumeric as below - if joint pain worsens -- MyChart and we will try a different Statin medication   Curcumin or Tumeric  Research has shown that Curcumin (Tumeric) supplements are just as good as high dose anti-inflammatory medications (like diclofenac and ibuprofen) at treating pain from osteoarthritis without the side effects.   Take Curcumin 500 mg Three times daily   -- You can find a bottle at [employer] for $8 which should last a month -- [place] is around $9 and is available at [employer]

## 2019-04-05 NOTE — Assessment & Plan Note (Signed)
BP at goal. Discussed with diabetes could consider switching to lisinopril. Will continue atenolol for now.

## 2019-04-05 NOTE — Assessment & Plan Note (Signed)
Trial of full dose of Crestor to see if that impacts joint pain. Stop fenofibrate. If joint pain worsens will change statin medication. If still not tolerated may either try higher dose of Coenzyme Q10 300 mg daily or return to current regimen.

## 2019-04-07 LAB — ANA: Anti Nuclear Antibody (ANA): NEGATIVE

## 2019-04-10 ENCOUNTER — Encounter: Payer: Self-pay | Admitting: Family Medicine

## 2019-04-12 MED ORDER — PRAVASTATIN SODIUM 20 MG PO TABS: 20.0000 mg | ORAL_TABLET | Freq: Every day | ORAL | 3 refills | Status: DC

## 2019-04-12 NOTE — Addendum Note (Signed)
Addended by: Waunita Schooner R on: 04/12/2019 02:10 PM   Modules accepted: Orders

## 2019-04-24 ENCOUNTER — Encounter: Payer: Self-pay | Admitting: Family Medicine

## 2019-04-25 ENCOUNTER — Ambulatory Visit: Payer: BC Managed Care – PPO | Attending: Internal Medicine

## 2019-04-25 DIAGNOSIS — Z23 Encounter for immunization: Secondary | ICD-10-CM

## 2019-04-25 NOTE — Progress Notes (Signed)
   Covid-19 Vaccination Clinic  Name:  QUILL RAJARAM    MRN: [id number] DOB: January 27, 1965  04/25/2019  Mr. Naba was observed post Covid-19 immunization for 15 minutes without incident. He was provided with Vaccine Information Sheet and instruction to access the V-Safe system.   Mr. Kis was instructed to call 911 with any severe reactions post vaccine: Marland Kitchen Difficulty breathing  . Swelling of face and throat  . A fast heartbeat  . A bad rash all over body  . Dizziness and weakness   Immunizations Administered    Name Date Dose VIS Date Route   Moderna COVID-19 Vaccine 04/25/2019  9:43 AM 0.5 mL 12/13/2018 Intramuscular   Manufacturer: Moderna   Lot: [id number]   [id number]

## 2019-05-02 DIAGNOSIS — M545 Low back pain: Secondary | ICD-10-CM | POA: Diagnosis not present

## 2019-05-02 DIAGNOSIS — M9903 Segmental and somatic dysfunction of lumbar region: Secondary | ICD-10-CM | POA: Diagnosis not present

## 2019-05-02 DIAGNOSIS — M9901 Segmental and somatic dysfunction of cervical region: Secondary | ICD-10-CM | POA: Diagnosis not present

## 2019-05-02 DIAGNOSIS — M5413 Radiculopathy, cervicothoracic region: Secondary | ICD-10-CM | POA: Diagnosis not present

## 2019-05-04 DIAGNOSIS — M9903 Segmental and somatic dysfunction of lumbar region: Secondary | ICD-10-CM | POA: Diagnosis not present

## 2019-05-04 DIAGNOSIS — M5413 Radiculopathy, cervicothoracic region: Secondary | ICD-10-CM | POA: Diagnosis not present

## 2019-05-04 DIAGNOSIS — M9901 Segmental and somatic dysfunction of cervical region: Secondary | ICD-10-CM | POA: Diagnosis not present

## 2019-05-04 DIAGNOSIS — M545 Low back pain: Secondary | ICD-10-CM | POA: Diagnosis not present

## 2019-05-08 DIAGNOSIS — M9903 Segmental and somatic dysfunction of lumbar region: Secondary | ICD-10-CM | POA: Diagnosis not present

## 2019-05-08 DIAGNOSIS — M5413 Radiculopathy, cervicothoracic region: Secondary | ICD-10-CM | POA: Diagnosis not present

## 2019-05-08 DIAGNOSIS — M9901 Segmental and somatic dysfunction of cervical region: Secondary | ICD-10-CM | POA: Diagnosis not present

## 2019-05-08 DIAGNOSIS — M545 Low back pain: Secondary | ICD-10-CM | POA: Diagnosis not present

## 2019-05-15 ENCOUNTER — Other Ambulatory Visit: Payer: Self-pay

## 2019-05-15 MED ORDER — ATENOLOL 50 MG PO TABS: 50.0000 mg | ORAL_TABLET | Freq: Every day | ORAL | 3 refills | Status: DC

## 2019-05-22 DIAGNOSIS — M9903 Segmental and somatic dysfunction of lumbar region: Secondary | ICD-10-CM | POA: Diagnosis not present

## 2019-05-22 DIAGNOSIS — M9901 Segmental and somatic dysfunction of cervical region: Secondary | ICD-10-CM | POA: Diagnosis not present

## 2019-05-22 DIAGNOSIS — M545 Low back pain: Secondary | ICD-10-CM | POA: Diagnosis not present

## 2019-05-22 DIAGNOSIS — M5413 Radiculopathy, cervicothoracic region: Secondary | ICD-10-CM | POA: Diagnosis not present

## 2019-07-04 ENCOUNTER — Ambulatory Visit: Payer: BC Managed Care – PPO | Admitting: Dermatology

## 2019-07-04 ENCOUNTER — Other Ambulatory Visit: Payer: Self-pay

## 2019-07-04 DIAGNOSIS — D239 Other benign neoplasm of skin, unspecified: Secondary | ICD-10-CM

## 2019-07-04 DIAGNOSIS — D229 Melanocytic nevi, unspecified: Secondary | ICD-10-CM

## 2019-07-04 DIAGNOSIS — L821 Other seborrheic keratosis: Secondary | ICD-10-CM

## 2019-07-04 DIAGNOSIS — L820 Inflamed seborrheic keratosis: Secondary | ICD-10-CM | POA: Diagnosis not present

## 2019-07-04 DIAGNOSIS — D2372 Other benign neoplasm of skin of left lower limb, including hip: Secondary | ICD-10-CM | POA: Diagnosis not present

## 2019-07-04 DIAGNOSIS — Z86006 Personal history of melanoma in-situ: Secondary | ICD-10-CM

## 2019-07-04 DIAGNOSIS — L814 Other melanin hyperpigmentation: Secondary | ICD-10-CM

## 2019-07-04 DIAGNOSIS — L578 Other skin changes due to chronic exposure to nonionizing radiation: Secondary | ICD-10-CM

## 2019-07-04 DIAGNOSIS — Z85828 Personal history of other malignant neoplasm of skin: Secondary | ICD-10-CM

## 2019-07-04 DIAGNOSIS — D1801 Hemangioma of skin and subcutaneous tissue: Secondary | ICD-10-CM

## 2019-07-04 DIAGNOSIS — Z1283 Encounter for screening for malignant neoplasm of skin: Secondary | ICD-10-CM

## 2019-07-04 DIAGNOSIS — L918 Other hypertrophic disorders of the skin: Secondary | ICD-10-CM

## 2019-07-04 NOTE — Progress Notes (Addendum)
Follow-Up Visit   Subjective  Larry Edwards is a 54 y.o. male who presents for the following: Follow-up.  Patient presents today for a follow up visit for [facility] on his back, Bx proven 11/29/18 on L spinal upper back and right shoulder that were treated with [facility] on 12/30/18.  Some spots on his mid chest are irritated.  The following portions of the chart were reviewed this encounter and updated as appropriate:      Review of Systems:  No other skin or systemic complaints except as noted in HPI or Assessment and Plan.  Objective  Well appearing patient in no apparent distress; mood and affect are within normal limits.  A focused examination was performed including face, neck, chest and back. Relevant physical exam findings are noted in the Assessment and Plan.  Objective  Left Spinal Upper Back, Right Shoulder - Posterior: Well healed scar with no evidence of recurrence.   Objective  Sternum x 8 (8): Erythematous keratotic or waxy stuck-on papule    Objective  Left lateral calf: Firm pink/brown papulenodule with dimple sign.    Assessment & Plan  History of basal cell carcinoma (BCC) (2) Right Shoulder - Posterior; Left Spinal Upper Back  Clear. Observe for recurrence. Call clinic for new or changing lesions.  Recommend regular skin exams, daily broad-spectrum spf 30+ sunscreen use, and photoprotection.     Inflamed seborrheic keratosis (8) Sternum x 8  Cryotherapy today Prior to procedure, discussed risks of blister formation, small wound, skin dyspigmentation, or rare scar following cryotherapy.    Destruction of lesion - Sternum x 8  Destruction method: cryotherapy   Informed consent: discussed and consent obtained   Lesion destroyed using liquid nitrogen: Yes   Region frozen until ice ball extended beyond lesion: Yes   Outcome: patient tolerated procedure well with no complications   Post-procedure details: wound care instructions given     Dermatofibroma Left lateral calf  Benign, observe.     Lentigines - Scattered tan macules - Discussed due to sun exposure - Benign, observe - Call for any changes  Seborrheic Keratoses - Stuck-on, waxy, tan-brown papules and plaques  - Discussed benign etiology and prognosis. - Observe - Call for any changes  Melanocytic Nevi - Tan-brown and/or pink-flesh-colored symmetric macules and papules - Benign appearing on exam today - Observation - Call clinic for new or changing moles - Recommend daily use of broad spectrum spf 30+ sunscreen to sun-exposed areas.   Hemangiomas - Red papules - Discussed benign nature - Observe - Call for any changes  Actinic Damage - diffuse erythematous macules with underlying dyspigmentation - Recommend daily broad spectrum sunscreen SPF 30+ to sun-exposed areas, reapply every 2 hours as needed.  - Call for new or changing lesions.  Acrochordons (Skin Tags) - Fleshy, skin-colored pedunculated papules - Benign appearing.  - Observe. - If desired, they can be removed with an in office procedure that is not covered by insurance. - Please call the clinic if you notice any new or changing lesions.  Skin cancer screening performed today.   History of Melanoma in Situ - No evidence of recurrence today - Recommend regular full body skin exams - Recommend daily broad spectrum sunscreen SPF 30+ to sun-exposed areas, reapply every 2 hours as needed.  - Call if any new or changing lesions are noted between office visits    Return in about 6 months (around 01/03/2020) for UBSE. I, Donzetta Kohut, CMA, am acting as scribe for [employer]  Nicole Kindred, MD .  Documentation: I have reviewed the above documentation for accuracy and completeness, and I agree with the above.  [name] MD

## 2019-07-04 NOTE — Patient Instructions (Addendum)
Recommend daily broad spectrum sunscreen SPF 30+ to sun-exposed areas, reapply every 2 hours as needed. Call for new or changing lesions.  Prior to procedure, discussed risks of blister formation, small wound, skin dyspigmentation, or rare scar following cryotherapy.   Cryotherapy Aftercare  . Wash gently with soap and water everyday.   Marland Kitchen Apply Vaseline and Band-Aid daily until healed  Seborrheic Keratosis  What causes seborrheic keratoses? Seborrheic keratoses are harmless, common skin growths that first appear during adult life.  As time goes by, more growths appear.  Some people may develop a large number of them.  Seborrheic keratoses appear on both covered and uncovered body parts.  They are not caused by sunlight.  The tendency to develop seborrheic keratoses can be inherited.  They vary in color from skin-colored to gray, brown, or even black.  They can be either smooth or have a rough, warty surface.   Seborrheic keratoses are superficial and look as if they were stuck on the skin.  Under the microscope this type of keratosis looks like layers upon layers of skin.  That is why at times the top layer may seem to fall off, but the rest of the growth remains and re-grows.    Treatment Seborrheic keratoses do not need to be treated, but can easily be removed in the office.  Seborrheic keratoses often cause symptoms when they rub on clothing or jewelry.  Lesions can be in the way of shaving.  If they become inflamed, they can cause itching, soreness, or burning.  Removal of a seborrheic keratosis can be accomplished by freezing, burning, or surgery. If any spot bleeds, scabs, or grows rapidly, please return to have it checked, as these can be an indication of a skin cancer.

## 2019-07-06 ENCOUNTER — Ambulatory Visit: Payer: BC Managed Care – PPO | Admitting: Family Medicine

## 2019-07-06 ENCOUNTER — Other Ambulatory Visit: Payer: Self-pay

## 2019-07-06 ENCOUNTER — Encounter: Payer: Self-pay | Admitting: Family Medicine

## 2019-07-06 VITALS — BP 144/78 | HR 67 | Temp 98.1°F | Ht 70.0 in | Wt 294.4 lb

## 2019-07-06 DIAGNOSIS — M130 Polyarthritis, unspecified: Secondary | ICD-10-CM

## 2019-07-06 DIAGNOSIS — E119 Type 2 diabetes mellitus without complications: Secondary | ICD-10-CM | POA: Diagnosis not present

## 2019-07-06 DIAGNOSIS — I10 Essential (primary) hypertension: Secondary | ICD-10-CM

## 2019-07-06 DIAGNOSIS — G5603 Carpal tunnel syndrome, bilateral upper limbs: Secondary | ICD-10-CM | POA: Diagnosis not present

## 2019-07-06 LAB — POCT GLYCOSYLATED HEMOGLOBIN (HGB A1C): Hemoglobin A1C: 6.3 % — AB (ref 4.0–5.6)

## 2019-07-06 MED ORDER — LISINOPRIL 10 MG PO TABS: 10.0000 mg | ORAL_TABLET | Freq: Every day | ORAL | 3 refills | Status: DC

## 2019-07-06 NOTE — Assessment & Plan Note (Signed)
Improved on Tumeric

## 2019-07-06 NOTE — Assessment & Plan Note (Signed)
Well controlled. Cont metformin and diet. Return in 6 months. Start ACE -I for BP

## 2019-07-06 NOTE — Assessment & Plan Note (Signed)
BP elevated today. Start lisinopril 10 mg daily. Cont atenolol. Home monitoring - with mychart in a few weeks. Return for BMP in 1 month

## 2019-07-06 NOTE — Progress Notes (Signed)
Subjective:     Larry Edwards is a 54 y.o. male presenting for Follow-up (3 month)     HPI   #Diabetes Currently taking metformin (Glucophage, [place])  Using medications without difficulties: No Hypoglycemic episodes:does not check Hyperglycemic episodes:does not check Feet problems:plantar fascitits in both feet and some pain on the top Blood Sugars averaging: does not check Last HgbA1c:  Lab Results  Component Value Date   HGBA1C 6.3 (A) 07/06/2019    Endorses some tingling with the feet and will get restless legs  Diabetes Health Maintenance Due:    Diabetes Health Maintenance Due  Topic Date Due  . OPHTHALMOLOGY EXAM  01/21/2019  . HEMOGLOBIN A1C  01/05/2020  . FOOT EXAM  Discontinued    #Joint pain - using tumeric twice daily - feels that his pain is improving - thinks his pain is improved - not as much with shoulders/elbows - still having hand discomfort - tingling and numbness which is worse with gripping - already wearing braces at night - doing exercises  #HTN - does not check at home  Review of Systems  04/05/2019: Clinic - HTN- bp at goal. DM - metformin. HLD - switch to pravastatin but with side effects returned to creastor. Joint pain - tumeric trial  Social History   Tobacco Use  Smoking Status Former Smoker  . Packs/day: 2.00  . Years: 20.00  . Pack years: 40.00  . Types: Cigarettes  . Quit date: 02/25/2011  . Years since quitting: 8.3  Smokeless Tobacco Former User        Objective:    BP Readings from Last 3 Encounters:  07/06/19 (!) 144/78  04/05/19 130/62  01/19/19 140/82   Wt Readings from Last 3 Encounters:  07/06/19 294 lb 6 oz (133.5 kg)  04/05/19 287 lb 4 oz (130.3 kg)  01/19/19 293 lb 12.8 oz (133.3 kg)    BP (!) 144/78   Pulse 67   Temp 98.1 F (36.7 C) (Oral)   Ht 5\' 10"  (1.778 m)   Wt 294 lb 6 oz (133.5 kg)   SpO2 97%   BMI 42.24 kg/m    Physical Exam Constitutional:      Appearance: Normal  appearance. He is not ill-appearing or diaphoretic.  HENT:     Right Ear: External ear normal.     Left Ear: External ear normal.     Nose: Nose normal.  Eyes:     General: No scleral icterus.    Extraocular Movements: Extraocular movements intact.     Conjunctiva/sclera: Conjunctivae normal.  Cardiovascular:     Rate and Rhythm: Normal rate and regular rhythm.     Heart sounds: No murmur heard.   Pulmonary:     Effort: Pulmonary effort is normal. No respiratory distress.     Breath sounds: Normal breath sounds. No wheezing.  Musculoskeletal:     Cervical back: Neck supple.     Comments: Bilateral hands: +phallen and +Tinel. Negative grind on the MCP joint  Skin:    General: Skin is warm and dry.  Neurological:     Mental Status: He is alert. Mental status is at baseline.  Psychiatric:        Mood and Affect: Mood normal.           Assessment & Plan:   Problem List Items Addressed This Visit      Cardiovascular and Mediastinum   Essential hypertension    BP elevated today. Start lisinopril 10 mg daily. Cont  atenolol. Home monitoring - with mychart in a few weeks. Return for BMP in 1 month      Relevant Medications   lisinopril (ZESTRIL) 10 MG tablet   Other Relevant Orders   Basic metabolic panel     Endocrine   Diabetes ([place]) - Primary    Well controlled. Cont metformin and diet. Return in 6 months. Start ACE -I for BP      Relevant Medications   lisinopril (ZESTRIL) 10 MG tablet   Other Relevant Orders   POCT glycosylated hemoglobin (Hb A1C) (Completed)     Nervous and Auditory   Carpal tunnel syndrome on both sides    Has been bracing and doing home exercises with symptoms on and off for years. Endorsing numbness and symptoms consistent with carpal tunnel. Discussed PT referral vs ortho consult and he would like to see ortho      Relevant Orders   Ambulatory referral to Orthopedic Surgery     Musculoskeletal and Integument   Polyarthritis     Improved on Tumeric          Return in about 6 months (around 01/05/2020).  Lesleigh Noe, MD  This visit occurred during the SARS-CoV-2 public health emergency.  Safety protocols were in place, including screening questions prior to the visit, additional usage of staff PPE, and extensive cleaning of exam room while observing appropriate contact time as indicated for disinfecting solutions.

## 2019-07-06 NOTE — Assessment & Plan Note (Signed)
Has been bracing and doing home exercises with symptoms on and off for years. Endorsing numbness and symptoms consistent with carpal tunnel. Discussed PT referral vs ortho consult and he would like to see ortho

## 2019-07-06 NOTE — Patient Instructions (Signed)
#  Blood pressure - start taking Lisinopril - return in 1 month for labs - MyChart in 2-3 weeks what your blood pressure has been doing    #Referral I have placed a referral to a specialist for you. You should receive a phone call from the specialty office. Make sure your voicemail is not full and that if you are able to answer your phone to unknown or new numbers.   It may take up to 2 weeks to hear about the referral. If you do not hear anything in 2 weeks, please call our office and ask to speak with the referral coordinator.

## 2019-07-25 ENCOUNTER — Encounter: Payer: Self-pay | Admitting: Family Medicine

## 2019-07-26 DIAGNOSIS — G5622 Lesion of ulnar nerve, left upper limb: Secondary | ICD-10-CM | POA: Diagnosis not present

## 2019-07-26 DIAGNOSIS — G5601 Carpal tunnel syndrome, right upper limb: Secondary | ICD-10-CM | POA: Diagnosis not present

## 2019-07-26 DIAGNOSIS — G5602 Carpal tunnel syndrome, left upper limb: Secondary | ICD-10-CM | POA: Diagnosis not present

## 2019-07-27 ENCOUNTER — Encounter: Payer: Self-pay | Admitting: Family Medicine

## 2019-07-31 MED ORDER — FLUTICASONE PROPIONATE 50 MCG/ACT NA SUSP: 2.0000 | Freq: Every day | NASAL | 3 refills | Status: DC

## 2019-07-31 MED ORDER — ESCITALOPRAM OXALATE 20 MG PO TABS: 20.0000 mg | ORAL_TABLET | Freq: Every day | ORAL | 1 refills | Status: DC

## 2019-07-31 MED ORDER — ROSUVASTATIN CALCIUM 5 MG PO TABS: 5.0000 mg | ORAL_TABLET | Freq: Every day | ORAL | 1 refills | Status: DC

## 2019-07-31 MED ORDER — METFORMIN HCL 1000 MG PO TABS: 1000.0000 mg | ORAL_TABLET | Freq: Two times a day (BID) | ORAL | 1 refills | Status: DC

## 2019-08-09 ENCOUNTER — Encounter: Payer: Self-pay | Admitting: Family Medicine

## 2019-08-09 DIAGNOSIS — I10 Essential (primary) hypertension: Secondary | ICD-10-CM

## 2019-08-10 MED ORDER — AMLODIPINE BESYLATE 5 MG PO TABS: 5.0000 mg | ORAL_TABLET | Freq: Every day | ORAL | 3 refills | Status: DC

## 2019-08-10 MED ORDER — LISINOPRIL 20 MG PO TABS
20.0000 mg | ORAL_TABLET | Freq: Every day | ORAL | 3 refills | Status: DC
Start: 2019-08-10 — End: 2019-09-19

## 2019-08-10 NOTE — Addendum Note (Signed)
Addended by: Waunita Schooner R on: 08/10/2019 10:40 AM   Modules accepted: Orders

## 2019-08-30 ENCOUNTER — Encounter: Payer: Self-pay | Admitting: Family Medicine

## 2019-08-30 ENCOUNTER — Other Ambulatory Visit: Payer: Self-pay

## 2019-08-30 ENCOUNTER — Ambulatory Visit (INDEPENDENT_AMBULATORY_CARE_PROVIDER_SITE_OTHER): Payer: BC Managed Care – PPO | Admitting: Family Medicine

## 2019-08-30 VITALS — BP 138/80 | HR 70 | Temp 97.9°F | Ht 70.0 in | Wt 291.0 lb

## 2019-08-30 DIAGNOSIS — I10 Essential (primary) hypertension: Secondary | ICD-10-CM

## 2019-08-30 DIAGNOSIS — E785 Hyperlipidemia, unspecified: Secondary | ICD-10-CM

## 2019-08-30 DIAGNOSIS — Z9189 Other specified personal risk factors, not elsewhere classified: Secondary | ICD-10-CM | POA: Diagnosis not present

## 2019-08-30 LAB — COMPREHENSIVE METABOLIC PANEL
ALT: 23 U/L (ref 0–53)
AST: 17 U/L (ref 0–37)
Albumin: 4.5 g/dL (ref 3.5–5.2)
Alkaline Phosphatase: 48 U/L (ref 39–117)
BUN: 18 mg/dL (ref 6–23)
CO2: 28 mEq/L (ref 19–32)
Calcium: 9.8 mg/dL (ref 8.4–10.5)
Chloride: 103 mEq/L (ref 96–112)
Creatinine, Ser: 1.01 mg/dL (ref 0.40–1.50)
GFR: 77.16 mL/min (ref >60.00)
Glucose, Bld: 171 mg/dL — ABNORMAL HIGH (ref 70–99)
Potassium: 4.8 mEq/L (ref 3.5–5.1)
Sodium: 139 mEq/L (ref 135–145)
Total Bilirubin: 0.2 mg/dL (ref 0.2–1.2)
Total Protein: 6.7 g/dL (ref 6.0–8.3)

## 2019-08-30 LAB — LDL CHOLESTEROL, DIRECT: Direct LDL: 66 mg/dL

## 2019-08-30 LAB — LIPID PANEL
Cholesterol: 150 mg/dL (ref 0–200)
HDL: 28.5 mg/dL — ABNORMAL LOW (ref >39.00)
Total CHOL/HDL Ratio: 5
Triglycerides: 441 mg/dL — ABNORMAL HIGH (ref 0.0–149.0)

## 2019-08-30 MED ORDER — AMLODIPINE BESYLATE 10 MG PO TABS: 10.0000 mg | ORAL_TABLET | Freq: Every day | ORAL | 1 refills | Status: DC

## 2019-08-30 NOTE — Assessment & Plan Note (Signed)
Taking crestor x 2 months. Will repeat lipids, goal <70 due to DM.

## 2019-08-30 NOTE — Progress Notes (Signed)
Subjective:     Larry Edwards is a 54 y.o. male presenting for Follow-up (2 month )     HPI  #HTN - feels ok - blood pressure still  - started atenolol first for blood pressure - taking medications   Review of Systems   Social History   Tobacco Use  Smoking Status Former Smoker  . Packs/day: 2.00  . Years: 20.00  . Pack years: 40.00  . Types: Cigarettes  . Quit date: 02/25/2011  . Years since quitting: 8.5  Smokeless Tobacco Former User        Objective:    BP Readings from Last 3 Encounters:  08/30/19 138/80  07/06/19 (!) 144/78  04/05/19 130/62   Wt Readings from Last 3 Encounters:  08/30/19 291 lb (132 kg)  07/06/19 294 lb 6 oz (133.5 kg)  04/05/19 287 lb 4 oz (130.3 kg)    BP 138/80   Pulse 70   Temp 97.9 F (36.6 C) (Temporal)   Ht 5\' 10"  (1.778 m)   Wt 291 lb (132 kg)   SpO2 96%   BMI 41.75 kg/m    Physical Exam Constitutional:      Appearance: Normal appearance. He is not ill-appearing or diaphoretic.  HENT:     Right Ear: External ear normal.     Left Ear: External ear normal.  Eyes:     General: No scleral icterus.    Extraocular Movements: Extraocular movements intact.     Conjunctiva/sclera: Conjunctivae normal.  Cardiovascular:     Rate and Rhythm: Normal rate and regular rhythm.     Heart sounds: No murmur heard.   Pulmonary:     Effort: Pulmonary effort is normal. No respiratory distress.     Breath sounds: Normal breath sounds. No wheezing.  Musculoskeletal:     Cervical back: Neck supple.  Skin:    General: Skin is warm and dry.  Neurological:     Mental Status: He is alert. Mental status is at baseline.  Psychiatric:        Mood and Affect: Mood normal.       The 10-year ASCVD risk score Mikey Bussing DC Jr., et al., 2013) is: 14.8%   Values used to calculate the score:     Age: 17 years     Sex: Male     Is Non-Hispanic African American: No     Diabetic: Yes     Tobacco smoker: No     Systolic Blood  Pressure: 138 mmHg     Is BP treated: Yes     HDL Cholesterol: 28 mg/dL     Total Cholesterol: 158 mg/dL     Assessment & Plan:   Problem List Items Addressed This Visit      Cardiovascular and Mediastinum   Essential hypertension - Primary    Continues to get elevated numbers at home. Discussed option of spironolactone vs increase dose of amlodipine/lisinopril. Will increase amlodipine and then lisinopril to max dose if bp not improving. MyChart if doses increased and BP in a few weeks. BMP today to assess Cr with lisinopril start.       Relevant Medications   amLODipine (NORVASC) 10 MG tablet   Other Relevant Orders   Comprehensive metabolic panel     Other   HLD (hyperlipidemia)    Taking crestor x 2 months. Will repeat lipids, goal <70 due to DM.       Relevant Medications   amLODipine (NORVASC) 10 MG tablet  Other Relevant Orders   Lipid panel   At risk for heart disease    Discussed ASCVD risk 14.8% as indication for asa x 10 years and statin therapy. He will start asa.           Return in about 6 months (around 03/01/2020).  Lesleigh Noe, MD  This visit occurred during the SARS-CoV-2 public health emergency.  Safety protocols were in place, including screening questions prior to the visit, additional usage of staff PPE, and extensive cleaning of exam room while observing appropriate contact time as indicated for disinfecting solutions.

## 2019-08-30 NOTE — Assessment & Plan Note (Signed)
Continues to get elevated numbers at home. Discussed option of spironolactone vs increase dose of amlodipine/lisinopril. Will increase amlodipine and then lisinopril to max dose if bp not improving. MyChart if doses increased and BP in a few weeks. BMP today to assess Cr with lisinopril start.

## 2019-08-30 NOTE — Patient Instructions (Addendum)
#  Hypertension - increase amlodipine to 10 mg daily - check blood pressure - if high after 1-2 weeks - can increase Lisinopril to 40 mg daily  If blood pressure remains high after the above changes - make an appointment to check and we can check in  Start 81 mg aspirin daily and take for 10 years

## 2019-08-30 NOTE — Assessment & Plan Note (Signed)
Discussed ASCVD risk 14.8% as indication for asa x 10 years and statin therapy. He will start asa.

## 2019-08-31 ENCOUNTER — Encounter: Payer: Self-pay | Admitting: Family Medicine

## 2019-08-31 DIAGNOSIS — E782 Mixed hyperlipidemia: Secondary | ICD-10-CM

## 2019-08-31 MED ORDER — ROSUVASTATIN CALCIUM 10 MG PO TABS: 10.0000 mg | ORAL_TABLET | Freq: Every day | ORAL | 3 refills | Status: DC

## 2019-09-19 DIAGNOSIS — R200 Anesthesia of skin: Secondary | ICD-10-CM | POA: Diagnosis not present

## 2019-09-19 MED ORDER — LISINOPRIL 40 MG PO TABS: 40.0000 mg | ORAL_TABLET | Freq: Every day | ORAL | 3 refills | Status: DC

## 2019-09-19 NOTE — Addendum Note (Signed)
Addended by: Waunita Schooner R on: 09/19/2019 09:00 PM   Modules accepted: Orders

## 2019-09-26 ENCOUNTER — Encounter: Payer: Self-pay | Admitting: Family Medicine

## 2019-09-28 ENCOUNTER — Other Ambulatory Visit: Payer: Self-pay | Admitting: Orthopedic Surgery

## 2019-10-04 DIAGNOSIS — H524 Presbyopia: Secondary | ICD-10-CM | POA: Diagnosis not present

## 2019-10-04 DIAGNOSIS — H01022 Squamous blepharitis right lower eyelid: Secondary | ICD-10-CM | POA: Diagnosis not present

## 2019-10-04 DIAGNOSIS — E119 Type 2 diabetes mellitus without complications: Secondary | ICD-10-CM | POA: Diagnosis not present

## 2019-10-04 DIAGNOSIS — H52223 Regular astigmatism, bilateral: Secondary | ICD-10-CM | POA: Diagnosis not present

## 2019-10-04 LAB — HM DIABETES EYE EXAM

## 2019-10-20 ENCOUNTER — Encounter: Payer: Self-pay | Admitting: Family Medicine

## 2019-10-30 ENCOUNTER — Other Ambulatory Visit: Payer: Self-pay

## 2019-10-30 ENCOUNTER — Encounter: Payer: Self-pay | Admitting: Family Medicine

## 2019-10-30 ENCOUNTER — Ambulatory Visit (INDEPENDENT_AMBULATORY_CARE_PROVIDER_SITE_OTHER): Payer: BC Managed Care – PPO | Admitting: Family Medicine

## 2019-10-30 VITALS — BP 120/70 | HR 64 | Temp 98.0°F | Ht 70.0 in | Wt 291.0 lb

## 2019-10-30 DIAGNOSIS — I10 Essential (primary) hypertension: Secondary | ICD-10-CM | POA: Diagnosis not present

## 2019-10-30 DIAGNOSIS — E785 Hyperlipidemia, unspecified: Secondary | ICD-10-CM | POA: Diagnosis not present

## 2019-10-30 DIAGNOSIS — Z0000 Encounter for general adult medical examination without abnormal findings: Secondary | ICD-10-CM

## 2019-10-30 DIAGNOSIS — Z1159 Encounter for screening for other viral diseases: Secondary | ICD-10-CM | POA: Diagnosis not present

## 2019-10-30 NOTE — Progress Notes (Signed)
Annual Exam   Chief Complaint:  Chief Complaint  Patient presents with  . Annual Exam    no concerns    History of Present Illness:  Larry Edwards is a 54 y.o. presents today for annual examination.     Nutrition/Lifestyle Diet: effort to reduce salt Exercise: not currently He is not sexually active.  Any issues with getting or keeping erection? No  Social History   Tobacco Use  Smoking Status Former Smoker  . Packs/day: 2.00  . Years: 20.00  . Pack years: 40.00  . Types: Cigarettes  . Quit date: 02/25/2011  . Years since quitting: 8.6  Smokeless Tobacco Former Systems developer   Social History   Substance and Sexual Activity  Alcohol Use Yes  . Alcohol/week: 0.0 standard drinks   Comment: 1-2 beers per week   Social History   Substance and Sexual Activity  Drug Use No     Safety The patient wears seatbelts: yes.     The patient feels safe at home and in their relationships: yes.  General Health Dentist in the last year: Yes Eye doctor: yes  Weight Wt Readings from Last 3 Encounters:  10/30/19 291 lb (132 kg)  08/30/19 291 lb (132 kg)  07/06/19 294 lb 6 oz (133.5 kg)   Patient has very high BMI  BMI Readings from Last 1 Encounters:  10/30/19 41.75 kg/m   Not currently trying to lose weight   Chronic disease screening Blood pressure monitoring:  BP Readings from Last 3 Encounters:  10/30/19 120/70  08/30/19 138/80  07/06/19 (!) 144/78    Lipid Monitoring: Indication for screening: age >35, obesity, diabetes, family hx, CV risk factors.  Lipid screening: Yes  Lab Results  Component Value Date   CHOL 150 08/30/2019   HDL 28.50 (L) 08/30/2019   LDLCALC 78 01/11/2019   LDLDIRECT 66.0 08/30/2019   TRIG (H) 08/30/2019    441.0 Triglyceride is over 400; calculations on Lipids are invalid.   CHOLHDL 5 08/30/2019     Diabetes Screening: age >73, overweight, family hx, PCOS, hx of gestational diabetes, at risk ethnicity, elevated blood pressure  >135/80.  Diabetes Screening screening: Yes  Lab Results  Component Value Date   HGBA1C 6.3 (A) 07/06/2019     Prostate Cancer Screening: Not Indicated Age 74-69 yo Shared Decision Making Higher Risk: Older age, African American, Family Hx of Prostate Cancer - No Benefits: screening may prevent 1.3 deaths from prostate cancer over 13 years per 1000 men screened and prevent 3 metastatic cases per 1000 men screened. Not enough evidence to support more benefit for AA or [place] Harms: False Positive and psychological harms. 15% of me with false positive over a 2 to 4 year period > resulting in biopsy and complications such as pain, hematospermia, infections. Overdiagnosis - increases with age - found that 20-50% of prostate cancer through screening may have never caused any issues. Harms of treatment include - erectile dysfunction, urinary incontinence, and bothersome bowel symptoms.   After discussion he does not want to get a PSA checked today.   Inadequate evidence for screening <55 No mortality benefit for screening >70   Lab Results  Component Value Date   PSA1 0.6 10/14/2017   PSA 0.42 10/24/2018   PSA 0.49 10/09/2016   PSA 0.50 10/08/2015       Colon Cancer Screening:  Age 82-75 yo - benefits outweigh the risk. Adults 22-85 yo who have never been screened benefit.  Benefits: 134000 people in 2016  will be diagnosed and 49,000 will die - early detection helps Harms: Complications 2/2 to colonoscopy High Risk (Colonoscopy): genetic disorder (Lynch syndrome or familial adenomatous polyposis), personal hx of IBD, previous adenomatous polyp, or previous colorectal cancer, FamHx start 10 years before the age at diagnosis, increased in males and black race  Options:  FIT - looks for hemoglobin (blood in the stool) - specific and fairly sensitive - must be done annually Cologuard - looks for DNA and blood - more sensitive - therefore can have more false positives, every 3  years Colonoscopy - every 10 years if normal - sedation, bowl prep, must have someone drive you  Shared decision making and the patient had decided to do colonoscopy - next due 2023.   Social History   Tobacco Use  Smoking Status Former Smoker  . Packs/day: 2.00  . Years: 20.00  . Pack years: 40.00  . Types: Cigarettes  . Quit date: 02/25/2011  . Years since quitting: 8.6  Smokeless Tobacco Former User    Lung Cancer Screening (Ages 11-80): yes 20 year pack history? Yes Current Tobacco user? No Quit less than 15 years ago? Yes Interested in low dose CT for lung cancer screening? Will check with insurance  Abdominal Aortic Aneurysm:  Age 50-75, 1 time screening, men who have ever smoked not due    Past Medical History:  Diagnosis Date  . Basal cell carcinoma 11/29/2018   left spinal upper back/superficial, right shoulder/superficial  . Diabetes mellitus without complication ([place])   . GERD (gastroesophageal reflux disease)   . Hx of dysplastic nevus 12/03/2011   R shoulder, severe  . Hyperlipidemia   . Hypertension   . Melanoma ([place]) 02/07/2009   upper back/in situ/excision  . Sleep apnea     Past Surgical History:  Procedure Laterality Date  . BACK SURGERY    . CHOLECYSTECTOMY  01/1995  . COLONOSCOPY WITH PROPOFOL N/A 10/30/2016   Procedure: COLONOSCOPY WITH PROPOFOL;  Surgeon: [name], MD;  Location: [facility] ENDOSCOPY;  Service: Gastroenterology;  Laterality: N/A;  . LUMBAR [place] SURGERY  05/2004  . TONSILLECTOMY AND ADENOIDECTOMY     age 55    Prior to Admission medications   Medication Sig Start Date End Date Taking? Authorizing Provider  aspirin EC 81 MG tablet Take 81 mg by mouth daily. Swallow whole.   Yes [provider]  atenolol (TENORMIN) 50 MG tablet Take 1 tablet (50 mg total) by mouth daily. 05/15/19  Yes Lesleigh Noe, MD  cholecalciferol (VITAMIN D) 1000 UNITS tablet Take 1,000 Units by mouth 2 (two) times daily.     Yes [provider]  escitalopram (LEXAPRO) 20 MG tablet Take 1 tablet (20 mg total) by mouth daily. Patient taking differently: Take 10 mg by mouth daily.  07/31/19  Yes Lesleigh Noe, MD  fluticasone (FLONASE) 50 MCG/ACT nasal spray Place 2 sprays into both nostrils daily. Patient taking differently: Place 2 sprays into both nostrils daily as needed for allergies.  07/31/19  Yes Lesleigh Noe, MD  lisinopril (ZESTRIL) 40 MG tablet Take 1 tablet (40 mg total) by mouth daily. 09/19/19  Yes Lesleigh Noe, MD  metFORMIN (GLUCOPHAGE) 1000 MG tablet Take 1 tablet (1,000 mg total) by mouth 2 (two) times daily with a meal. 07/31/19  Yes Lesleigh Noe, MD  rosuvastatin (CRESTOR) 10 MG tablet Take 1 tablet (10 mg total) by mouth daily. 08/31/19  Yes Lesleigh Noe, MD    No Known Allergies  Social History   Socioeconomic History  . Marital status: Married    Spouse name: Amy  . Number of children: Not on file  . Years of education: College  . Highest education level: Not on file  Occupational History  . Not on file  Tobacco Use  . Smoking status: Former Smoker    Packs/day: 2.00    Years: 20.00    Pack years: 40.00    Types: Cigarettes    Quit date: 02/25/2011    Years since quitting: 8.6  . Smokeless tobacco: Former Network engineer  . Vaping Use: Never used  Substance and Sexual Activity  . Alcohol use: Yes    Alcohol/week: 0.0 standard drinks    Comment: 1-2 beers per week  . Drug use: No  . Sexual activity: Not Currently  Other Topics Concern  . Not on file  Social History Narrative   04/05/19   From: the area   Living: with wife, Amy (2006)   Work: Charity fundraiser      Family: sister nearby - not a good relationship      Enjoys: try to relax[place]      Exercise: not currently   Diet: meat and 2 veggies - tries to do Government social research officer belts: Yes    Guns: Yes  and secure   Safe in relationships: Yes    Social  Determinants of Health   Financial Resource Strain:   . Difficulty of Paying Living Expenses: Not on file  Food Insecurity:   . Worried About Charity fundraiser in the Last Year: Not on file  . Ran Out of Food in the Last Year: Not on file  Transportation Needs:   . Lack of Transportation (Medical): Not on file  . Lack of Transportation (Non-Medical): Not on file  Physical Activity:   . Days of Exercise per Week: Not on file  . Minutes of Exercise per Session: Not on file  Stress:   . Feeling of Stress : Not on file  Social Connections:   . Frequency of Communication with Friends and Family: Not on file  . Frequency of Social Gatherings with Friends and Family: Not on file  . Attends Religious Services: Not on file  . Active Member of Clubs or Organizations: Not on file  . Attends Archivist Meetings: Not on file  . Marital Status: Not on file  Intimate Partner Violence:   . Fear of Current or Ex-Partner: Not on file  . Emotionally Abused: Not on file  . Physically Abused: Not on file  . Sexually Abused: Not on file    Family History  Problem Relation Age of Onset  . Diabetes Mother   . Ovarian cancer Mother        ovarian  . Diabetes Father   . Congestive Heart Failure Father   . Congestive Heart Failure Maternal Grandmother   . Stroke Maternal Grandfather 63  . Heart attack Maternal Grandfather   . Stroke Paternal Grandfather 30  . Heart attack Paternal Grandfather     Review of Systems  Constitutional: Negative for chills and fever.  HENT: Negative for congestion and sore throat.   Eyes: Negative for blurred vision and double vision.  Respiratory: Negative for shortness of breath.   Cardiovascular: Negative for chest pain.  Gastrointestinal: Negative for heartburn, nausea and vomiting.  Genitourinary: Negative.   Musculoskeletal: Negative.  Negative  for myalgias.  Skin: Negative for rash.  Neurological: Negative for dizziness and headaches.   Endo/Heme/Allergies: Does not bruise/bleed easily.  Psychiatric/Behavioral: Negative for depression. The patient is not nervous/anxious.      Physical Exam BP 120/70   Pulse 64   Temp 98 F (36.7 C) (Temporal)   Ht 5\' 10"  (1.778 m)   Wt 291 lb (132 kg)   SpO2 97%   BMI 41.75 kg/m    BP Readings from Last 3 Encounters:  10/30/19 120/70  08/30/19 138/80  07/06/19 (!) 144/78      Physical Exam Constitutional:      General: He is not in acute distress.    Appearance: He is well-developed. He is obese. He is not diaphoretic.  HENT:     Head: Normocephalic and atraumatic.     Right Ear: Tympanic membrane and ear canal normal.     Left Ear: Tympanic membrane and ear canal normal.     Nose: Nose normal.     Mouth/Throat:     Pharynx: Uvula midline.  Eyes:     General: No scleral icterus.    Conjunctiva/sclera: Conjunctivae normal.     Pupils: Pupils are equal, round, and reactive to light.  Cardiovascular:     Rate and Rhythm: Normal rate and regular rhythm.     Heart sounds: Normal heart sounds. No murmur heard.   Pulmonary:     Effort: Pulmonary effort is normal. No respiratory distress.     Breath sounds: Normal breath sounds. No wheezing.  Abdominal:     General: Bowel sounds are normal. There is no distension.     Palpations: Abdomen is soft. There is no mass.     Tenderness: There is no abdominal tenderness. There is no guarding.  Musculoskeletal:        General: Normal range of motion.     Cervical back: Normal range of motion and neck supple.  Lymphadenopathy:     Cervical: No cervical adenopathy.  Skin:    General: Skin is warm and dry.     Capillary Refill: Capillary refill takes less than 2 seconds.  Neurological:     Mental Status: He is alert and oriented to person, place, and time.  Psychiatric:        Mood and Affect: Mood normal.        Behavior: Behavior normal.        Results:  PHQ-9:    Office Visit from 10/24/2018 in [facility]  PHQ-9 Total Score 2        Assessment: 54 y.o. here for routine annual physical examination.  Plan: Problem List Items Addressed This Visit      Cardiovascular and Mediastinum   Essential hypertension     Other   HLD (hyperlipidemia)   Relevant Orders   Lipid panel    Other Visit Diagnoses    Annual physical exam    -  Primary   Encounter for hepatitis C screening test for low risk patient       Relevant Orders   Hepatitis C antibody      Screening: -- Blood pressure screen normal -- cholesterol screening: will obtain -- Weight screening: obese: discussed management options, including lifestyle, dietary, and exercise. -- Diabetes Screening: not due for screening -- Nutrition: normal - Encouraged healthy diet  The 10-year ASCVD risk score Mikey Bussing DC Jr., et al., 2013) is: 10.8%   Values used to calculate the score:     Age: 25  years     Sex: Male     Is Non-Hispanic African American: No     Diabetic: Yes     Tobacco smoker: No     Systolic Blood Pressure: 528 mmHg     Is BP treated: Yes     HDL Cholesterol: 28.5 mg/dL     Total Cholesterol: 150 mg/dL  -- ASA 81 mg discussed if CVD risk >10% age 44-59 and willing to take for 10 years -- Statin therapy for Age 1-75 with CVD risk >7.5%  Psych -- Depression screening (PHQ-9): negative  Safety -- tobacco screening: not using -- alcohol screening:  low-risk usage. -- no evidence of domestic violence or intimate partner violence.  Cancer Screening -- Prostate (age 5-69) declined -- Colon (age 28-75) up to date -- Lung he will check with insurance   Immunizations Immunization History  Administered Date(s) Administered  . Moderna SARS-COVID-2 Vaccination 03/24/2019, 04/25/2019  . Pneumococcal Polysaccharide-23 10/14/2015  . Td 07/05/2002  . Tdap 10/18/2017    -- flu vaccine declined -- TDAP q10 years up to date -- Shingles (age >53) will check with insurance -- PPSV-23 (19-64  with chronic disease or smoking) up to date -- Covid-19 Vaccine up to date  Encouraged regular vision and dental screening. Encouraged healthy exercise and diet.   Lesleigh Noe

## 2019-10-30 NOTE — Patient Instructions (Addendum)
Smoking history - low dose CT Scan  - call back if wanting the referral to do the screening  Shingles - vaccine - check with insurance -- Nurse visit or pharmacy  Return for fasting cholesterol labs   I would encourage trying to lose some weight

## 2019-11-06 ENCOUNTER — Other Ambulatory Visit: Payer: Self-pay

## 2019-11-06 ENCOUNTER — Encounter
Admission: RE | Admit: 2019-11-06 | Discharge: 2019-11-06 | Disposition: A | Discharge status: Home / Self Care | Payer: BC Managed Care – PPO | Source: Ambulatory Visit | Attending: Orthopedic Surgery | Admitting: Orthopedic Surgery

## 2019-11-06 NOTE — Patient Instructions (Addendum)
Your procedure is scheduled on: 11/16/19 Report to [facility]. To find out your arrival time please call [phone] between 1PM - 3PM on 11/15/19.  Remember: Instructions that are not followed completely may result in serious medical risk, up to and including death, or upon the discretion of your surgeon and anesthesiologist your surgery may need to be rescheduled.     _X__ 1. Do not eat food after midnight the night before your procedure.                 No gum chewing or hard candies. You may drink clear liquids up to 2 hours                 before you are scheduled to arrive for your surgery- DO not drink clear                 liquids within 2 hours of the start of your surgery.                 Clear Liquids include:  water, apple juice without pulp, clear carbohydrate                 drink such as Clearfast or Gatorade, Black Coffee or Tea (Do not add                 anything to coffee or tea). Diabetics water only  __X__2.  On the morning of surgery brush your teeth with toothpaste and water, you                 may rinse your mouth with mouthwash if you wish.  Do not swallow any              toothpaste of mouthwash.     _X__ 3.  No Alcohol for 24 hours before or after surgery.   _X__ 4.  Do Not Smoke or use e-cigarettes For 24 Hours Prior to Your Surgery.                 Do not use any chewable tobacco products for at least 6 hours prior to                 surgery.  ____  5.  Bring all medications with you on the day of surgery if instructed.   __X__  6.  Notify your doctor if there is any change in your medical condition      (cold, fever, infections).     Do not wear jewelry, make-up, hairpins, clips or nail polish. Do not wear lotions, powders, or perfumes.  Do not shave 48 hours prior to surgery. Men may shave face and neck. Do not bring valuables to the hospital.    [facility] is not responsible for any belongings or  valuables.  Contacts, dentures/partials or body piercings may not be worn into surgery. Bring a case for your contacts, glasses or hearing aids, a denture cup will be supplied. Leave your suitcase in the car. After surgery it may be brought to your room. For patients admitted to the hospital, discharge time is determined by your treatment team.   Patients discharged the day of surgery will not be allowed to drive home.   Please read over the following fact sheets that you were given:   MRSA Information  __X__ Take these medicines the morning of surgery with A SIP OF WATER:  1. atenolol (TENORMIN) 50 MG tablet  2. rosuvastatin (CRESTOR) 10 MG tablet  3.   4.  5.  6.  ____ Fleet Enema (as directed)   __X__ Use CHG Soap/SAGE wipes as directed  ____ Use inhalers on the day of surgery  __X__ Stop metformin/Janumet/Farxiga 2 days prior to surgery    ____ Take 1/2 of usual insulin dose the night before surgery. No insulin the morning          of surgery.   ____ Stop Blood Thinners Coumadin/Plavix/Xarelto/Pleta/Pradaxa/Eliquis/Effient/Aspirin  on   Or contact your Surgeon, Cardiologist or Medical Doctor regarding  ability to stop your blood thinners  __X__ Stop Anti-inflammatories 7 days before surgery such as Advil, Ibuprofen, Motrin,  BC or Goodies Powder, Naprosyn, Naproxen, Aleve, Aspirin    __X__ Stop all herbal supplements, fish oil or vitamin E until after surgery.    __X__ Bring C-Pap to the hospital.

## 2019-11-08 ENCOUNTER — Other Ambulatory Visit: Payer: Self-pay

## 2019-11-08 ENCOUNTER — Encounter
Admission: RE | Admit: 2019-11-08 | Discharge: 2019-11-08 | Disposition: A | Discharge status: Home / Self Care | Payer: BC Managed Care – PPO | Source: Ambulatory Visit | Attending: Orthopedic Surgery | Admitting: Orthopedic Surgery

## 2019-11-08 DIAGNOSIS — I10 Essential (primary) hypertension: Secondary | ICD-10-CM | POA: Insufficient documentation

## 2019-11-08 DIAGNOSIS — Z01810 Encounter for preprocedural cardiovascular examination: Secondary | ICD-10-CM | POA: Insufficient documentation

## 2019-11-14 ENCOUNTER — Other Ambulatory Visit
Admission: RE | Admit: 2019-11-14 | Discharge: 2019-11-14 | Disposition: A | Discharge status: Home / Self Care | Payer: BC Managed Care – PPO | Source: Ambulatory Visit | Attending: Orthopedic Surgery | Admitting: Orthopedic Surgery

## 2019-11-14 ENCOUNTER — Other Ambulatory Visit: Payer: Self-pay

## 2019-11-14 DIAGNOSIS — Z20822 Contact with and (suspected) exposure to covid-19: Secondary | ICD-10-CM | POA: Insufficient documentation

## 2019-11-14 DIAGNOSIS — Z01812 Encounter for preprocedural laboratory examination: Secondary | ICD-10-CM | POA: Diagnosis not present

## 2019-11-14 LAB — SARS CORONAVIRUS 2 (TAT 6-24 HRS): SARS Coronavirus 2: NEGATIVE

## 2019-11-16 ENCOUNTER — Ambulatory Visit: Payer: BC Managed Care – PPO | Admitting: Anesthesiology

## 2019-11-16 ENCOUNTER — Encounter
Admission: RE | Disposition: A | Discharge status: Home / Self Care | Payer: Self-pay | Source: Home / Self Care | Attending: Orthopedic Surgery

## 2019-11-16 ENCOUNTER — Encounter: Payer: Self-pay | Admitting: Orthopedic Surgery

## 2019-11-16 ENCOUNTER — Other Ambulatory Visit: Payer: Self-pay

## 2019-11-16 ENCOUNTER — Ambulatory Visit
Admission: RE | Admit: 2019-11-16 | Discharge: 2019-11-16 | Disposition: A | Discharge status: Home / Self Care | Payer: BC Managed Care – PPO | Attending: Orthopedic Surgery | Admitting: Orthopedic Surgery

## 2019-11-16 DIAGNOSIS — Z7984 Long term (current) use of oral hypoglycemic drugs: Secondary | ICD-10-CM | POA: Diagnosis not present

## 2019-11-16 DIAGNOSIS — Z87891 Personal history of nicotine dependence: Secondary | ICD-10-CM | POA: Insufficient documentation

## 2019-11-16 DIAGNOSIS — Z7982 Long term (current) use of aspirin: Secondary | ICD-10-CM | POA: Diagnosis not present

## 2019-11-16 DIAGNOSIS — E785 Hyperlipidemia, unspecified: Secondary | ICD-10-CM | POA: Diagnosis not present

## 2019-11-16 DIAGNOSIS — G5603 Carpal tunnel syndrome, bilateral upper limbs: Secondary | ICD-10-CM | POA: Diagnosis not present

## 2019-11-16 DIAGNOSIS — Z79899 Other long term (current) drug therapy: Secondary | ICD-10-CM | POA: Insufficient documentation

## 2019-11-16 DIAGNOSIS — I10 Essential (primary) hypertension: Secondary | ICD-10-CM | POA: Diagnosis not present

## 2019-11-16 DIAGNOSIS — E1165 Type 2 diabetes mellitus with hyperglycemia: Secondary | ICD-10-CM | POA: Diagnosis not present

## 2019-11-16 DIAGNOSIS — G5601 Carpal tunnel syndrome, right upper limb: Secondary | ICD-10-CM | POA: Diagnosis not present

## 2019-11-16 HISTORY — PX: CARPAL TUNNEL RELEASE: SHX101

## 2019-11-16 LAB — GLUCOSE, CAPILLARY
Glucose-Capillary: 121 mg/dL — ABNORMAL HIGH (ref 70–99)
Glucose-Capillary: 127 mg/dL — ABNORMAL HIGH (ref 70–99)

## 2019-11-16 SURGERY — CARPAL TUNNEL RELEASE
Anesthesia: General | Site: Wrist | Laterality: Right

## 2019-11-16 MED ORDER — DEXAMETHASONE SODIUM PHOSPHATE 10 MG/ML IJ SOLN
INTRAMUSCULAR | Status: DC | PRN
  Administered 2019-11-16: 5 mg via INTRAVENOUS

## 2019-11-16 MED ORDER — LIDOCAINE HCL (PF) 2 % IJ SOLN
INTRAMUSCULAR | Status: AC
  Filled 2019-11-16: qty 5

## 2019-11-16 MED ORDER — FAMOTIDINE 20 MG PO TABS
ORAL_TABLET | ORAL | Status: AC
  Administered 2019-11-16: 20 mg via ORAL
  Filled 2019-11-16: qty 1

## 2019-11-16 MED ORDER — MIDAZOLAM HCL 2 MG/2ML IJ SOLN
INTRAMUSCULAR | Status: AC
  Filled 2019-11-16: qty 2

## 2019-11-16 MED ORDER — FAMOTIDINE 20 MG PO TABS: 20.0000 mg | ORAL_TABLET | Freq: Once | ORAL | Status: AC

## 2019-11-16 MED ORDER — ONDANSETRON HCL 4 MG/2ML IJ SOLN
INTRAMUSCULAR | Status: AC
  Filled 2019-11-16: qty 2

## 2019-11-16 MED ORDER — ACETAMINOPHEN 10 MG/ML IV SOLN
INTRAVENOUS | Status: AC
  Filled 2019-11-16: qty 100

## 2019-11-16 MED ORDER — ACETAMINOPHEN 10 MG/ML IV SOLN
INTRAVENOUS | Status: DC | PRN
  Administered 2019-11-16: 1000 mg via INTRAVENOUS

## 2019-11-16 MED ORDER — DEXMEDETOMIDINE (PRECEDEX) IN NS 20 MCG/5ML (4 MCG/ML) IV SYRINGE
PREFILLED_SYRINGE | INTRAVENOUS | Status: DC | PRN
  Administered 2019-11-16: 20 ug via INTRAVENOUS

## 2019-11-16 MED ORDER — DEXMEDETOMIDINE (PRECEDEX) IN NS 20 MCG/5ML (4 MCG/ML) IV SYRINGE
PREFILLED_SYRINGE | INTRAVENOUS | Status: AC
  Filled 2019-11-16: qty 5

## 2019-11-16 MED ORDER — PROPOFOL 10 MG/ML IV BOLUS
INTRAVENOUS | Status: AC
  Filled 2019-11-16: qty 20

## 2019-11-16 MED ORDER — DEXAMETHASONE SODIUM PHOSPHATE 10 MG/ML IJ SOLN
INTRAMUSCULAR | Status: AC
  Filled 2019-11-16: qty 1

## 2019-11-16 MED ORDER — FENTANYL CITRATE (PF) 100 MCG/2ML IJ SOLN
INTRAMUSCULAR | Status: DC | PRN
  Administered 2019-11-16 (×2): 25 ug via INTRAVENOUS

## 2019-11-16 MED ORDER — CEFAZOLIN SODIUM-DEXTROSE 2-4 GM/100ML-% IV SOLN
INTRAVENOUS | Status: AC
  Filled 2019-11-16: qty 100

## 2019-11-16 MED ORDER — PROMETHAZINE HCL 25 MG/ML IJ SOLN: 6.2500 mg | INTRAMUSCULAR | Status: DC | PRN

## 2019-11-16 MED ORDER — FENTANYL CITRATE (PF) 100 MCG/2ML IJ SOLN: 25.0000 ug | INTRAMUSCULAR | Status: DC | PRN

## 2019-11-16 MED ORDER — SODIUM CHLORIDE 0.9 % IV SOLN: INTRAVENOUS | Status: DC

## 2019-11-16 MED ORDER — ORAL CARE MOUTH RINSE: 15.0000 mL | Freq: Once | OROMUCOSAL | Status: AC

## 2019-11-16 MED ORDER — CHLORHEXIDINE GLUCONATE 0.12 % MT SOLN: 15.0000 mL | Freq: Once | OROMUCOSAL | Status: AC

## 2019-11-16 MED ORDER — MIDAZOLAM HCL 2 MG/2ML IJ SOLN
INTRAMUSCULAR | Status: DC | PRN
  Administered 2019-11-16: 2 mg via INTRAVENOUS

## 2019-11-16 MED ORDER — PROPOFOL 10 MG/ML IV BOLUS
INTRAVENOUS | Status: DC | PRN
  Administered 2019-11-16: 180 mg via INTRAVENOUS

## 2019-11-16 MED ORDER — ONDANSETRON HCL 4 MG/2ML IJ SOLN
INTRAMUSCULAR | Status: DC | PRN
  Administered 2019-11-16: 4 mg via INTRAVENOUS

## 2019-11-16 MED ORDER — OXYCODONE-ACETAMINOPHEN 5-325 MG PO TABS
1.0000 | ORAL_TABLET | ORAL | 0 refills | Status: DC | PRN
Start: 2019-11-16 — End: 2019-12-04

## 2019-11-16 MED ORDER — DEXTROSE 5 % IV SOLN
3.0000 g | INTRAVENOUS | Status: AC
  Administered 2019-11-16: 3 g via INTRAVENOUS
  Filled 2019-11-16: qty 3

## 2019-11-16 MED ORDER — BUPIVACAINE HCL 0.5 % IJ SOLN
INTRAMUSCULAR | Status: DC | PRN
  Administered 2019-11-16: 15 mL

## 2019-11-16 MED ORDER — LIDOCAINE HCL (CARDIAC) PF 100 MG/5ML IV SOSY
PREFILLED_SYRINGE | INTRAVENOUS | Status: DC | PRN
  Administered 2019-11-16: 100 mg via INTRAVENOUS

## 2019-11-16 MED ORDER — CHLORHEXIDINE GLUCONATE 0.12 % MT SOLN
OROMUCOSAL | Status: AC
  Administered 2019-11-16: 15 mL via OROMUCOSAL
  Filled 2019-11-16: qty 15

## 2019-11-16 MED ORDER — FENTANYL CITRATE (PF) 100 MCG/2ML IJ SOLN
INTRAMUSCULAR | Status: AC
  Filled 2019-11-16: qty 2

## 2019-11-16 SURGICAL SUPPLY — 25 items
APL PRP STRL LF DISP 70% ISPRP (MISCELLANEOUS) ×1
BNDG ELASTIC 3X5.8 VLCR STR LF (GAUZE/BANDAGES/DRESSINGS) ×2 IMPLANT
CANISTER SUCT 1200ML W/VALVE (MISCELLANEOUS) ×2 IMPLANT
CHLORAPREP W/TINT 26 (MISCELLANEOUS) ×2 IMPLANT
COVER WAND RF STERILE (DRAPES) ×2 IMPLANT
CUFF TOURN SGL QUICK 18X4 (TOURNIQUET CUFF) ×1 IMPLANT
ELECT CAUTERY NDL 2.0 MIC (NEEDLE) IMPLANT
ELECT CAUTERY NEEDLE 2.0 MIC (NEEDLE) IMPLANT
GAUZE SPONGE 4X4 12PLY STRL (GAUZE/BANDAGES/DRESSINGS) ×2 IMPLANT
GAUZE XEROFORM 1X8 LF (GAUZE/BANDAGES/DRESSINGS) ×2 IMPLANT
GLOVE SURG SYN 9.0  PF PI (GLOVE) ×2
GLOVE SURG SYN 9.0 PF PI (GLOVE) ×1 IMPLANT
GOWN SRG 2XL LVL 4 RGLN SLV (GOWNS) ×1 IMPLANT
GOWN STRL NON-REIN 2XL LVL4 (GOWNS) ×2
GOWN STRL REUS W/ TWL LRG LVL3 (GOWN DISPOSABLE) ×1 IMPLANT
GOWN STRL REUS W/TWL LRG LVL3 (GOWN DISPOSABLE) ×2
KIT TURNOVER KIT A (KITS) ×2 IMPLANT
NS IRRIG 500ML POUR BTL (IV SOLUTION) ×2 IMPLANT
PACK EXTREMITY (MISCELLANEOUS) ×2 IMPLANT
PAD CAST CTTN 4X4 STRL (SOFTGOODS) ×1 IMPLANT
PADDING CAST COTTON 4X4 STRL (SOFTGOODS) ×2
SCALPEL PROTECTED #15 DISP (BLADE) ×4 IMPLANT
SUT ETHILON 4-0 (SUTURE) ×2
SUT ETHILON 4-0 FS2 18XMFL BLK (SUTURE) ×1
SUTURE ETHLN 4-0 FS2 18XMF BLK (SUTURE) ×1 IMPLANT

## 2019-11-16 NOTE — Anesthesia Postprocedure Evaluation (Signed)
Anesthesia Post Note  Patient: Larry Edwards  Procedure(s) Performed: CARPAL TUNNEL RELEASE (Right Wrist)  Patient location during evaluation: PACU Anesthesia Type: General Level of consciousness: awake and alert Pain management: pain level controlled Vital Signs Assessment: post-procedure vital signs reviewed and stable Respiratory status: spontaneous breathing, nonlabored ventilation, respiratory function stable and patient connected to nasal cannula oxygen Cardiovascular status: blood pressure returned to baseline and stable Postop Assessment: no apparent nausea or vomiting Anesthetic complications: no   No complications documented.   Last Vitals:  Vitals:   11/16/19 1245 11/16/19 1258  BP: (!) 103/49 (!) 109/57  Pulse: (!) 59 61  Resp: (!) 21 18  Temp:  (!) 35.8 C  SpO2: 95% 100%    Last Pain:  Vitals:   11/16/19 1258  TempSrc: Temporal  PainSc:                  Martha Clan

## 2019-11-16 NOTE — Op Note (Signed)
11/16/2019  12:04 PM  PATIENT:  Larry Edwards  54 y.o. 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  PROCEDURE:  Procedure(s): CARPAL TUNNEL RELEASE (Right)  SURGEON: Laurene Footman, MD  ASSISTANTS: None  ANESTHESIA:   general  EBL:  Total I/O In: 150 [IV Piggyback:150] Out: -   BLOOD ADMINISTERED:none  DRAINS: none   LOCAL MEDICATIONS USED:  MARCAINE     SPECIMEN:  No Specimen  DISPOSITION OF SPECIMEN:  N/A  COUNTS:  YES  TOURNIQUET:   Total Tourniquet Time Documented: Upper Arm (Right) - 13 minutes Total: Upper Arm (Right) - 13 minutes   IMPLANTS: None  DICTATION: .Dragon Dictation patient brought the operating room and after adequate general anesthesia was obtained the right arm was prepped and draped in the usual sterile fashion.  A tourniquet was applied to the upper arm and after prepping and draping in the usual sterile fashion appropriate patient identification and timeout procedures were completed.  Tourniquet was raised to 250 mmHg and volar incision was made in line with the ring metacarpal approximately 2 and half centimeters in length.  The skin and subcutaneous tissue spread and the transverse carpal ligament identified.  This was opened and a vascular hemostat was placed deep to protect the underlying structures.  Release carried distally until fat was noted around the nerve and proximally about a centimeter proximal to the wrist flexion crease at which point there was palpable release and good vascular blush to the nerve this appeared to be the area of maximum compression.  The wound is irrigated and infiltrated with 15 cc half percent Sensorcaine and closed with simple interrupted 4-0 nylon skin suture.  Xeroform 4 x 4 web roll and Ace wrap applied  PLAN OF CARE: Discharge to home after PACU  PATIENT DISPOSITION:  PACU - hemodynamically stable.

## 2019-11-16 NOTE — Discharge Instructions (Addendum)
Loosen Ace wrap prior to dismissal and if fingers swell through the weekend. Work on finger motion is much as you can. Keep dressing clean and dry. Pain medicine has been sent to use as directed. Call office if you are having problems.   AMBULATORY SURGERY  DISCHARGE INSTRUCTIONS   1) The drugs that you were given will stay in your system until tomorrow so for the next 24 hours you should not:  A) Drive an automobile B) Make any legal decisions C) Drink any alcoholic beverage   2) You may resume regular meals tomorrow.  Today it is better to start with liquids and gradually work up to solid foods.  You may eat anything you prefer, but it is better to start with liquids, then soup and crackers, and gradually work up to solid foods.   3) Please notify your doctor immediately if you have any unusual bleeding, trouble breathing, redness and pain at the surgery site, drainage, fever, or pain not relieved by medication.    4) Additional Instructions:        Please contact your physician with any problems or Same Day Surgery at [phone], Monday through Friday 6 am to 4 pm, or [place] at [facility] number at [phone].

## 2019-11-16 NOTE — Transfer of Care (Signed)
Immediate Anesthesia Transfer of Care Note  Patient: Larry Edwards  Procedure(s) Performed: Procedure(s): CARPAL TUNNEL RELEASE (Right)  Patient Location: PACU  Anesthesia Type:General  Level of Consciousness: sedated  Airway & Oxygen Therapy: Patient Spontanous Breathing and Patient connected to face mask oxygen  Post-op Assessment: Report given to RN and Post -op Vital signs reviewed and stable  Post vital signs: Reviewed and stable  Last Vitals:  Vitals:   11/16/19 0910 11/16/19 1200  BP: 130/69 (!) 93/53  Pulse: (!) 58 (!) 57  Resp: 14 15  Temp: (!) 36.3 C (!) (P) 36.2 C  SpO2: 55% 73%    Complications: No apparent anesthesia complications

## 2019-11-16 NOTE — Anesthesia Procedure Notes (Signed)
Procedure Name: LMA Insertion Date/Time: 11/16/2019 11:26 AM Performed by: Doreen Salvage, CRNA Pre-anesthesia Checklist: Patient identified, Patient being monitored, Timeout performed, Emergency Drugs available and Suction available Patient Re-evaluated:Patient Re-evaluated prior to induction Oxygen Delivery Method: Circle system utilized Preoxygenation: Pre-oxygenation with 100% oxygen Induction Type: IV induction Ventilation: Mask ventilation without difficulty LMA: LMA inserted LMA Size: 5.0 Tube type: Oral Number of attempts: 1 Placement Confirmation: positive ETCO2 and breath sounds checked- equal and bilateral Tube secured with: Tape Dental Injury: Teeth and Oropharynx as per pre-operative assessment

## 2019-11-16 NOTE — H&P (Signed)
Chief Complaint  Patient presents with  . Right Wrist - Pre-op Exam   Larry Edwards is a 55 y.o. male who presents today for history and physical for right carpal tunnel release with Dr. Hessie Knows at the hospital on November 16, 2019. Patient has chronic moderate severity bilateral carpal tunnel syndrome. Carpal tunnel symptoms have been present for years. He has numbness and tingling in the median nerve distribution of the right greater than left hand. He has tried nighttime splints with no improvement, despite splinting will still have awakening and discomfort in the mornings. He denies any significant weakness. Recent nerve conduction study showed chronic moderate to severe right and left carpal tunnel syndrome.  Past Medical History: Past Medical History:  Diagnosis Date  . Hypertension  . Sleep apnea   Past Surgical History: Past Surgical History:  Procedure Laterality Date  . CHOLECYSTECTOMY  . LAMINECTOMY LUMBAR SPINE Right 11/2018  this is the third time he had this (1998, 2006, 2020)  . TONSILLECTOMY   Past Family History: Family History  Family history unknown: Yes   Medications: Current Outpatient Medications Ordered in Epic  Medication Sig Dispense Refill  . lisinopriL (ZESTRIL) 40 MG tablet Take 40 mg by mouth once daily  . rosuvastatin (CRESTOR) 10 MG tablet Take 1 tablet by mouth once daily  . aspirin 81 MG EC tablet Take by mouth  . atenoloL (TENORMIN) 50 MG tablet Take by mouth  . cholecalciferol (VITAMIN D3) 1000 unit tablet Take by mouth  . escitalopram oxalate (LEXAPRO) 10 MG tablet Take by mouth  . fluticasone propionate (FLONASE) 50 mcg/actuation nasal spray USE 2 SPRAYS IN EACH NOSTRIL DAILY  . metFORMIN (GLUCOPHAGE) 1000 MG tablet TAKE 1 TABLET TWICE A DAY WITH MEALS   No current Epic-ordered facility-administered medications on file.   Allergies: No Known Allergies   Review of Systems:  A comprehensive 14 point ROS was performed, reviewed by me  today, and the pertinent orthopaedic findings are documented in the HPI.  Exam: BP 118/74  Ht 177.8 cm (5\' 10" )  Wt (!) 132.5 kg (292 lb)  BMI 41.90 kg/m  General:  Well developed, well nourished, no apparent distress, normal affect, normal gait with no antalgic component.   HEENT: Head normocephalic, atraumatic, PERRL.   Abdomen: Soft, non tender, non distended, Bowel sounds present.  Heart: Examination of the heart reveals regular, rate, and rhythm. There is no murmur noted on ascultation. There is a normal apical pulse.  Lungs: Lungs are clear to auscultation. There is no wheeze, rhonchi, or crackles. There is normal expansion of bilateral chest walls.   Neck: Neck has full range of motion. There is no tenderness to palpation. Spurling's test is negative.   Right upper Extremity: Normal shoulder contour. Good active and passive range of motion and stability of the shoulder, elbow, and wrist. Normal motion of the hand and digits. No swelling, erythema, or ecchymosis is noted. There is no triggering or locking of the digits noted. No thenar atrophy is visualized. No intrinsic wasting. The patient has a positive bilateral Phalen's test. The patient has a negative bilateral Tinel's test. The patient has normal pinprick and light touch sensation in the median nerve distribution. There is normal grip strength and pincer strength. The patient has less than 2 second capillary refill with good skin warmth. Normal radial and ulnar pulse is palpated. Left elbow with tenderness along the cubital tunnel. Positive bilateral tenderness along the lateral epicondyles with painful resisted wrist extension bilaterally.  Neurologic: Sensory function is intact, except as noted above. Motor strength is 5/5, except as noted above. No tremor or clonus is present. Good motor coordination is noted.   EMG nerve conduction studies from September 2021 Impression: Abnormal study. There is electrodiagnostic  evidence of chronic, moderate severeity bilateral carpal tunnel syndrome.    Impression: Carpal tunnel syndrome of right wrist [G56.01] Carpal tunnel syndrome of right wrist (primary encounter diagnosis)  Plan:  1. Risks, benefits, complications of a right carpal tunnel release at the hospital have been discussed with the patient. Patient has agreed and consented procedure with Dr. Hessie Knows November 16, 2019.  This note was generated in part with voice recognition software and I apologize for any typographical errors that were not detected and corrected.  Feliberto Gottron MPA-C    Electronically signed by Feliberto Gottron, [place] at 11/08/2019 4:32 PM EDT  Reviewed  H+P No changes noted.

## 2019-11-16 NOTE — Anesthesia Preprocedure Evaluation (Signed)
Anesthesia Evaluation  Patient identified by MRN, date of birth, ID band Patient awake    Reviewed: Allergy & Precautions, H&P , NPO status , Patient's Chart, lab work & pertinent test results, reviewed documented beta blocker date and time   History of Anesthesia Complications Negative for: history of anesthetic complications  Airway Mallampati: III  TM Distance: >3 FB Neck ROM: full    Dental  (+) Dental Advidsory Given   Pulmonary neg shortness of breath, sleep apnea and Continuous Positive Airway Pressure Ventilation , neg COPD, neg recent URI, former smoker,    Pulmonary exam normal        Cardiovascular Exercise Tolerance: Good hypertension, (-) angina(-) CAD, (-) Past MI, (-) Cardiac Stents and (-) CABG Normal cardiovascular exam(-) dysrhythmias (-) Valvular Problems/Murmurs     Neuro/Psych PSYCHIATRIC DISORDERS Anxiety negative neurological ROS     GI/Hepatic Neg liver ROS, GERD  ,  Endo/Other  diabetes, Well Controlled, Oral Hypoglycemic AgentsMorbid obesity  Renal/GU negative Renal ROS  negative genitourinary   Musculoskeletal   Abdominal   Peds  Hematology negative hematology ROS (+)   Anesthesia Other Findings Past Medical History: No date: Diabetes mellitus without complication (HCC) No date: GERD (gastroesophageal reflux disease) No date: Hyperlipidemia No date: Hypertension No date: Sleep apnea   Reproductive/Obstetrics negative OB ROS                             Anesthesia Physical  Anesthesia Plan  ASA: III  Anesthesia Plan: General   Post-op Pain Management:    Induction: Intravenous  PONV Risk Score and Plan: 2 and Ondansetron, Dexamethasone, Midazolam and Treatment may vary due to age or medical condition  Airway Management Planned: LMA  Additional Equipment:   Intra-op Plan:   Post-operative Plan: Extubation in OR  Informed Consent: I have reviewed  the patients History and Physical, chart, labs and discussed the procedure including the risks, benefits and alternatives for the proposed anesthesia with the patient or authorized representative who has indicated his/her understanding and acceptance.     Dental Advisory Given  Plan Discussed with: Anesthesiologist, CRNA and Surgeon  Anesthesia Plan Comments:         Anesthesia Quick Evaluation

## 2019-11-17 ENCOUNTER — Encounter: Payer: Self-pay | Admitting: Orthopedic Surgery

## 2019-11-21 ENCOUNTER — Other Ambulatory Visit (INDEPENDENT_AMBULATORY_CARE_PROVIDER_SITE_OTHER): Payer: BC Managed Care – PPO

## 2019-11-21 ENCOUNTER — Other Ambulatory Visit: Payer: Self-pay

## 2019-11-21 ENCOUNTER — Other Ambulatory Visit: Payer: Self-pay | Admitting: Family Medicine

## 2019-11-21 DIAGNOSIS — I10 Essential (primary) hypertension: Secondary | ICD-10-CM

## 2019-11-21 DIAGNOSIS — E785 Hyperlipidemia, unspecified: Secondary | ICD-10-CM

## 2019-11-21 DIAGNOSIS — Z1159 Encounter for screening for other viral diseases: Secondary | ICD-10-CM | POA: Diagnosis not present

## 2019-11-21 DIAGNOSIS — E781 Pure hyperglyceridemia: Secondary | ICD-10-CM

## 2019-11-21 LAB — LIPID PANEL
Cholesterol: 137 mg/dL (ref 0–200)
HDL: 29.4 mg/dL — ABNORMAL LOW (ref >39.00)
NonHDL: 107.54
Total CHOL/HDL Ratio: 5
Triglycerides: 259 mg/dL — ABNORMAL HIGH (ref 0.0–149.0)
VLDL: 51.8 mg/dL — ABNORMAL HIGH (ref 0.0–40.0)

## 2019-11-21 LAB — BASIC METABOLIC PANEL
BUN: 14 mg/dL (ref 6–23)
CO2: 27 mEq/L (ref 19–32)
Calcium: 9.2 mg/dL (ref 8.4–10.5)
Chloride: 101 mEq/L (ref 96–112)
Creatinine, Ser: 0.94 mg/dL (ref 0.40–1.50)
GFR: 92.48 mL/min (ref >60.00)
Glucose, Bld: 126 mg/dL — ABNORMAL HIGH (ref 70–99)
Potassium: 4.3 mEq/L (ref 3.5–5.1)
Sodium: 136 mEq/L (ref 135–145)

## 2019-11-21 LAB — LDL CHOLESTEROL, DIRECT: Direct LDL: 72 mg/dL

## 2019-11-21 MED ORDER — ICOSAPENT ETHYL 1 G PO CAPS: 2.0000 g | ORAL_CAPSULE | Freq: Two times a day (BID) | ORAL | 1 refills | Status: DC

## 2019-11-22 LAB — HEPATITIS C ANTIBODY
Hepatitis C Ab: NONREACTIVE
SIGNAL TO CUT-OFF: 0.05 (ref <1.00)

## 2019-11-24 DIAGNOSIS — G4733 Obstructive sleep apnea (adult) (pediatric): Secondary | ICD-10-CM | POA: Diagnosis not present

## 2019-11-27 ENCOUNTER — Other Ambulatory Visit: Payer: Self-pay

## 2019-11-27 ENCOUNTER — Encounter: Payer: Self-pay | Admitting: Family Medicine

## 2019-11-27 DIAGNOSIS — E781 Pure hyperglyceridemia: Secondary | ICD-10-CM

## 2019-11-27 MED ORDER — ICOSAPENT ETHYL 1 G PO CAPS: 2.0000 g | ORAL_CAPSULE | Freq: Two times a day (BID) | ORAL | 0 refills | Status: DC

## 2019-12-04 ENCOUNTER — Ambulatory Visit (INDEPENDENT_AMBULATORY_CARE_PROVIDER_SITE_OTHER): Payer: BC Managed Care – PPO | Admitting: Dermatology

## 2019-12-04 ENCOUNTER — Encounter: Payer: Self-pay | Admitting: Dermatology

## 2019-12-04 ENCOUNTER — Other Ambulatory Visit: Payer: Self-pay

## 2019-12-04 DIAGNOSIS — Z86007 Personal history of in-situ neoplasm of skin: Secondary | ICD-10-CM | POA: Diagnosis not present

## 2019-12-04 DIAGNOSIS — Z1283 Encounter for screening for malignant neoplasm of skin: Secondary | ICD-10-CM | POA: Diagnosis not present

## 2019-12-04 DIAGNOSIS — L918 Other hypertrophic disorders of the skin: Secondary | ICD-10-CM

## 2019-12-04 DIAGNOSIS — Z85828 Personal history of other malignant neoplasm of skin: Secondary | ICD-10-CM

## 2019-12-04 DIAGNOSIS — D2372 Other benign neoplasm of skin of left lower limb, including hip: Secondary | ICD-10-CM

## 2019-12-04 DIAGNOSIS — L820 Inflamed seborrheic keratosis: Secondary | ICD-10-CM

## 2019-12-04 DIAGNOSIS — D239 Other benign neoplasm of skin, unspecified: Secondary | ICD-10-CM

## 2019-12-04 DIAGNOSIS — D489 Neoplasm of uncertain behavior, unspecified: Secondary | ICD-10-CM

## 2019-12-04 DIAGNOSIS — D1800 Hemangioma unspecified site: Secondary | ICD-10-CM

## 2019-12-04 DIAGNOSIS — L578 Other skin changes due to chronic exposure to nonionizing radiation: Secondary | ICD-10-CM

## 2019-12-04 DIAGNOSIS — D229 Melanocytic nevi, unspecified: Secondary | ICD-10-CM

## 2019-12-04 DIAGNOSIS — D225 Melanocytic nevi of trunk: Secondary | ICD-10-CM | POA: Diagnosis not present

## 2019-12-04 DIAGNOSIS — L821 Other seborrheic keratosis: Secondary | ICD-10-CM

## 2019-12-04 HISTORY — DX: Other benign neoplasm of skin, unspecified: D23.9

## 2019-12-04 NOTE — Progress Notes (Signed)
Follow-Up Visit   Subjective  Larry Edwards is a 54 y.o. male who presents for the following: Annual Exam (TBSE). 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 He has a couple of white spots on each shoulder that he noticed recently that are irritating.  Also the spot on his R cheek has come back after being frozen off in the past.  The following portions of the chart were reviewed this encounter and updated as appropriate:      Review of Systems:  No other skin or systemic complaints except as noted in HPI or Assessment and Plan.  Objective  Well appearing patient in no apparent distress; mood and affect are within normal limits.  A full examination was performed including scalp, head, eyes, ears, nose, lips, neck, chest, axillae, abdomen, back, buttocks, bilateral upper extremities, bilateral lower extremities, hands, feet, fingers, toes, fingernails, and toenails. All findings within normal limits unless otherwise noted below.  Objective  R shoulder, L spinal upper back: Well healed round white scar with no evidence of recurrence.   Objective  R shoulder x 2, L shoulder x 1, L post neck x 1, R malar cheek x 1 (recurrent) (5): Erythematous keratotic or waxy stuck-on papules or plaque.   Objective  Right Abdomen: 4.93mm dark brown macule        Assessment & Plan   Skin cancer screening performed today.  Actinic Damage - chronic, secondary to cumulative UV radiation exposure/sun exposure over time - diffuse scaly erythematous macules with underlying dyspigmentation - Recommend daily broad spectrum sunscreen SPF 30+ to sun-exposed areas, reapply every 2 hours as needed.  - Call for new or changing lesions.  Melanocytic Nevi - Tan-brown and/or pink-flesh-colored symmetric macules and papules - Benign appearing on exam today - Observation - Call clinic  for new or changing moles - Recommend daily use of broad spectrum spf 30+ sunscreen to sun-exposed areas.   Seborrheic Keratoses - Stuck-on, waxy, tan-brown papules and plaques  - Discussed benign etiology and prognosis. - Observe - Call for any changes  Acrochordons (Skin Tags) - Fleshy, skin-colored pedunculated papules - Benign appearing.  - Observe. - If desired, they can be removed with an in office procedure that is not covered by insurance. - Please call the clinic if you notice any new or changing lesions.  Dermatofibroma - Firm pink/brown papulenodule with dimple sign on the left lateral lower leg - Benign appearing - Call for any changes   Hemangiomas - Red papules - Discussed benign nature - Observe - Call for any changes  History of Melanoma in Situ - No evidence of recurrence today of the upper back - Recommend regular full body skin exams - Recommend daily broad spectrum sunscreen SPF 30+ to sun-exposed areas, reapply every 2 hours as needed.  - Call if any new or changing lesions are noted between office visits  History of basal cell carcinoma (BCC) R shoulder, L spinal upper back  Clear. Observe for recurrence. Call clinic for new or changing lesions.  Recommend regular skin exams, daily broad-spectrum spf 30+ sunscreen use, and photoprotection.     Inflamed seborrheic keratosis (5) R shoulder x 2, L shoulder x 1, L post neck x 1, R malar cheek x 1 (recurrent)    Destruction of lesion - R shoulder x 2, L shoulder x 1, L post  neck x 1, R malar cheek x 1 (recurrent)  Destruction method: cryotherapy   Informed consent: discussed and consent obtained   Lesion destroyed using liquid nitrogen: Yes   Region frozen until ice ball extended beyond lesion: Yes   Outcome: patient tolerated procedure well with no complications   Post-procedure details: wound care instructions given    Neoplasm of uncertain behavior Right Abdomen  Epidermal / dermal  shaving  Lesion diameter (cm):  0.6 Informed consent: discussed and consent obtained   Patient was prepped and draped in usual sterile fashion: Area prepped with alcohol. Anesthesia: the lesion was anesthetized in a standard fashion   Anesthetic:  1% lidocaine w/ epinephrine 1-100,000 buffered w/ 8.4% NaHCO3 Instrument used: flexible razor blade   Hemostasis achieved with: pressure, aluminum chloride and electrodesiccation   Outcome: patient tolerated procedure well   Post-procedure details: wound care instructions given   Post-procedure details comment:  Ointment and small bandage applied  Specimen 1 - Surgical pathology Differential Diagnosis: Nevus r/o Dysplasia Check Margins: Yes 4.42mm dark brown macule  Nevus r/o dysplasia  Return in about 1 year (around 12/03/2020) for TBSE.   IJamesetta Orleans, CMA, am acting as scribe for [name], MD .  Documentation: I have reviewed the above documentation for accuracy and completeness, and I agree with the above.  [name] MD

## 2019-12-04 NOTE — Patient Instructions (Addendum)
Cryotherapy Aftercare  . Wash gently with soap and water everyday.   Marland Kitchen Apply Vaseline and Band-Aid daily until healed.   Wound Care Instructions  1. Cleanse wound gently with soap and water once a day then pat dry with clean gauze. Apply a thing coat of Petrolatum (petroleum jelly, "Vaseline") over the wound (unless you have an allergy to this). We recommend that you use a new, sterile tube of Vaseline. Do not pick or remove scabs. Do not remove the yellow or white "healing tissue" from the base of the wound.  2. Cover the wound with fresh, clean, nonstick gauze and secure with paper tape. You may use Band-Aids in place of gauze and tape if the would is small enough, but would recommend trimming much of the tape off as there is often too much. Sometimes Band-Aids can irritate the skin.  3. You should call the office for your biopsy report after 1 week if you have not already been contacted.  4. If you experience any problems, such as abnormal amounts of bleeding, swelling, significant bruising, significant pain, or evidence of infection, please call the office immediately.

## 2019-12-11 ENCOUNTER — Telehealth: Payer: Self-pay

## 2019-12-11 DIAGNOSIS — G4733 Obstructive sleep apnea (adult) (pediatric): Secondary | ICD-10-CM

## 2019-12-11 NOTE — Telephone Encounter (Signed)
-----   Message from [name], MD sent at 12/05/2019  7:32 PM EST ----- Skin , right abdomen DYSPLASTIC COMPOUND NEVUS WITH SEVERE ATYPIA, CLOSE TO MARGIN  Severely atypical mole- needs excision

## 2019-12-11 NOTE — Telephone Encounter (Signed)
Advised pt of bx results.  Scheduled pt for excision of severe dysplastic nevus on 02/05/19 at 3:30./sh

## 2019-12-11 NOTE — Telephone Encounter (Signed)
Aaron Edelman, please advise if okay to order new cpap machine.  Per records, current setting is 18cm.

## 2019-12-11 NOTE — Telephone Encounter (Signed)
12/11/2019  Patient last seen in January/2021.  Excerpt of that discharge summary listed below:  OSA (obstructive sleep apnea) Plan: Continue CPAP therapy Prescription sent to [facility] for them to, on service patient's current machine Prescription also provided for the patient to obtain a new CPAP machine as well as supplies from [email or web address] if he does decide to do so Follow-up in 1 year    Return in about 1 year (around 01/19/2020), or if symptoms worsen or fail to improve, for [facility].   Lauraine Rinne, NP 01/19/2019  As the patient looking for an additional ordered that he never obtain one from [email or web address] which is what he requested in January/2021?  If he never received 1 and then go this route and needs a replacement CPAP on fine providing that.  Patient does need an upcoming office visit in January/2022 with the [place] [facility].  Wyn Quaker, FNP

## 2019-12-18 ENCOUNTER — Other Ambulatory Visit: Payer: Self-pay | Admitting: Orthopedic Surgery

## 2019-12-19 ENCOUNTER — Encounter: Payer: BC Managed Care – PPO | Admitting: Dermatology

## 2019-12-28 ENCOUNTER — Other Ambulatory Visit: Payer: Self-pay

## 2019-12-28 ENCOUNTER — Encounter
Admission: RE | Admit: 2019-12-28 | Discharge: 2019-12-28 | Disposition: A | Discharge status: Home / Self Care | Payer: BC Managed Care – PPO | Source: Ambulatory Visit | Attending: Orthopedic Surgery | Admitting: Orthopedic Surgery

## 2019-12-28 NOTE — Patient Instructions (Addendum)
Your procedure is scheduled on: 01/04/20 Report to [facility] ENTRANCE AFTER CHECKING AT [place]. To find out your arrival time please call [phone] between 1PM - 3PM on 01/03/20. The secretary will call yo between 1-3 pm.  Remember: Instructions that are not followed completely may result in serious medical risk, up to and including death, or upon the discretion of your surgeon and anesthesiologist your surgery may need to be rescheduled.     _X__ 1. Do not eat food after midnight the night before your procedure.                 No gum chewing or hard candies. You may drink clear liquids up to 2 hours                 before you are scheduled to arrive for your surgery- DO not drink clear                 liquids within 2 hours of the start of your surgery.                 Clear Liquids include:  water, apple juice without pulp, clear carbohydrate                 drink such as Clearfast or Gatorade, Black Coffee or Tea (Do not add                 anything to coffee or tea). Diabetics water only  __X__2.  On the morning of surgery brush your teeth with toothpaste and water, you                 may rinse your mouth with mouthwash if you wish.  Do not swallow any              toothpaste of mouthwash.     _X__ 3.  No Alcohol for 24 hours before or after surgery.   _X__ 4.  Do Not Smoke or use e-cigarettes For 24 Hours Prior to Your Surgery.                 Do not use any chewable tobacco products for at least 6 hours prior to                 surgery.  ____  5.  Bring all medications with you on the day of surgery if instructed.   __X__  6.  Notify your doctor if there is any change in your medical condition      (cold, fever, infections).     Do not wear jewelry, make-up, hairpins, clips or nail polish. Do not wear lotions, powders, or perfumes.  Do not shave 48 hours prior to surgery. Men may shave face and neck. Do not bring  valuables to the hospital.    [facility] is not responsible for any belongings or valuables.  Contacts, dentures/partials or body piercings may not be worn into surgery. Bring a case for your contacts, glasses or hearing aids, a denture cup will be supplied. Leave your suitcase in the car. After surgery it may be brought to your room. For patients admitted to the hospital, discharge time is determined by your treatment team.   Patients discharged the day of surgery will not be allowed to drive home.   Please read over the following fact sheets that you were given:     __X__ Take  these medicines the morning of surgery with A SIP OF WATER:    1. atenolol (TENORMIN) 50 MG tablet  2. rosuvastatin (CRESTOR) 10 MG tablet  3.   4.  5.  6.  ____ Fleet Enema (as directed)   __X__ Use CHG Soap/SAGE wipes as directed  ____ Use inhalers on the day of surgery  __X__ Stop metformin/Janumet/Farxiga 2 days prior to surgery    ____ Take 1/2 of usual insulin dose the night before surgery. No insulin the morning          of surgery.   ____ Stop Blood Thinners Coumadin/Plavix/Xarelto/Pleta/Pradaxa/Eliquis/Effient/Aspirin  on   Or contact your Surgeon, Cardiologist or Medical Doctor regarding  ability to stop your blood thinners  __X__ Stop Anti-inflammatories 7 days before surgery such as Advil, Ibuprofen, Motrin,  BC or Goodies Powder, Naprosyn, Naproxen, Aleve, Aspirin    __X__ Stop all herbal supplements, fish oil or vitamin E until after surgery.    ____ Bring C-Pap to the hospital.    G2 GATORADE IF AVAILABLE ALLOWED HOURS PRIOR TO YOUR ARRIVAL   How to Use an Incentive Spirometer An incentive spirometer is a tool that measures how well you are filling your lungs with each breath. Learning to take long, deep breaths using this tool can help you keep your lungs clear and active. This may help to reverse or lessen your chance of developing breathing (pulmonary) problems, especially  infection. You may be asked to use a spirometer:  After a surgery.  If you have a lung problem or a history of smoking.  After a long period of time when you have been unable to move or be active. If the spirometer includes an indicator to show the highest number that you have reached, your health care provider or respiratory therapist will help you set a goal. Keep a list (log) of your progress as told by your health care provider. What are the risks?  Breathing too quickly may cause dizziness or cause you to pass out. Take your time so you do not get dizzy or light-headed.  If you are in pain, you may need to take pain medicine before doing incentive spirometry. It is harder to take a deep breath if you are having pain. How to use your incentive spirometer  1. Sit up on the edge of your bed or on a chair. 2. Hold the incentive spirometer so that it is in an upright position. 3. Before you use the spirometer, breathe out normally. 4. Place the mouthpiece in your mouth. Make sure your lips are closed tightly around it. 5. Breathe in slowly and as deeply as you can through your mouth, causing the piston or the ball to rise toward the top of the chamber. 6. Hold your breath for 3-5 seconds, or for as long as possible. ? If the spirometer includes a coach indicator, use this to guide you in breathing. Slow down your breathing if the indicator goes above the marked areas. 7. Remove the mouthpiece from your mouth and breathe out normally. The piston or ball will return to the bottom of the chamber. 8. Rest for a few seconds, then repeat the steps 10 or more times. ? Take your time and take a few normal breaths between deep breaths so that you do not get dizzy or light-headed. ? Do this every 1-2 hours when you are awake. 9. If the spirometer includes a goal marker to show the highest number you have reached (best effort), use  this as a goal to work toward during each repetition. 10. After each  set of 10 deep breaths, cough a few times. This will help to make sure that your lungs are clear. ? If you have an incision on your chest or abdomen from surgery, place a pillow or a rolled-up towel firmly against the incision when you cough. This can help to reduce pain from coughing. General tips  When you become able to get out of bed, walk around often and continue to cough to help clear your lungs.  Keep using the incentive spirometer until your health care provider says it is okay to stop using it. If you have been in the hospital, you may be told to keep using the spirometer at home. Contact a health care provider if:  You are having difficulty using the spirometer.  You have trouble using the spirometer as often as instructed.  Your pain medicine is not giving enough relief for you to use the spirometer as told.  You have a fever.  You develop shortness of breath. Get help right away if:  You develop a cough with bloody mucus from the lungs (bloody sputum).  You have fluid or blood coming from an incision site after you cough. Summary  An incentive spirometer is a tool that can help you learn to take long, deep breaths to keep your lungs clear and active.  You may be asked to use a spirometer after a surgery, if you have a lung problem or a history of smoking, or if you have been inactive for a long period of time.  Use your incentive spirometer as instructed every 1-2 hours while you are awake.  If you have an incision on your chest or abdomen, place a pillow or a rolled-up towel firmly against your incision when you cough. This will help to reduce pain. This information is not intended to replace advice given to you by your health care provider. Make sure you discuss any questions you have with your health care provider. Document Revised: 07/29/2018 Document Reviewed: 11/11/2016 Elsevier Patient Education  2020 [employer].

## 2020-01-02 ENCOUNTER — Other Ambulatory Visit: Payer: BC Managed Care – PPO

## 2020-01-02 ENCOUNTER — Other Ambulatory Visit: Payer: Self-pay

## 2020-01-02 ENCOUNTER — Other Ambulatory Visit
Admission: RE | Admit: 2020-01-02 | Discharge: 2020-01-02 | Disposition: A | Discharge status: Home / Self Care | Payer: BC Managed Care – PPO | Source: Ambulatory Visit | Attending: Orthopedic Surgery | Admitting: Orthopedic Surgery

## 2020-01-02 DIAGNOSIS — Z79899 Other long term (current) drug therapy: Secondary | ICD-10-CM | POA: Diagnosis not present

## 2020-01-02 DIAGNOSIS — Z7982 Long term (current) use of aspirin: Secondary | ICD-10-CM | POA: Diagnosis not present

## 2020-01-02 DIAGNOSIS — Z01812 Encounter for preprocedural laboratory examination: Secondary | ICD-10-CM | POA: Insufficient documentation

## 2020-01-02 DIAGNOSIS — Z7984 Long term (current) use of oral hypoglycemic drugs: Secondary | ICD-10-CM | POA: Diagnosis not present

## 2020-01-02 DIAGNOSIS — Z9049 Acquired absence of other specified parts of digestive tract: Secondary | ICD-10-CM | POA: Diagnosis not present

## 2020-01-02 DIAGNOSIS — Z20822 Contact with and (suspected) exposure to covid-19: Secondary | ICD-10-CM | POA: Insufficient documentation

## 2020-01-02 DIAGNOSIS — G5602 Carpal tunnel syndrome, left upper limb: Secondary | ICD-10-CM | POA: Diagnosis not present

## 2020-01-02 LAB — SARS CORONAVIRUS 2 (TAT 6-24 HRS): SARS Coronavirus 2: NEGATIVE

## 2020-01-03 MED ORDER — DEXTROSE 5 % IV SOLN
3.0000 g | INTRAVENOUS | Status: AC
  Administered 2020-01-04: 3 g via INTRAVENOUS
  Filled 2020-01-03: qty 3

## 2020-01-04 ENCOUNTER — Ambulatory Visit: Payer: BC Managed Care – PPO | Admitting: Certified Registered Nurse Anesthetist

## 2020-01-04 ENCOUNTER — Ambulatory Visit
Admission: RE | Admit: 2020-01-04 | Discharge: 2020-01-04 | Disposition: A | Discharge status: Home / Self Care | Payer: BC Managed Care – PPO | Attending: Orthopedic Surgery | Admitting: Orthopedic Surgery

## 2020-01-04 ENCOUNTER — Encounter
Admission: RE | Disposition: A | Discharge status: Home / Self Care | Payer: Self-pay | Source: Home / Self Care | Attending: Orthopedic Surgery

## 2020-01-04 ENCOUNTER — Encounter: Payer: Self-pay | Admitting: Orthopedic Surgery

## 2020-01-04 ENCOUNTER — Other Ambulatory Visit: Payer: Self-pay

## 2020-01-04 DIAGNOSIS — E119 Type 2 diabetes mellitus without complications: Secondary | ICD-10-CM | POA: Diagnosis not present

## 2020-01-04 DIAGNOSIS — G5602 Carpal tunnel syndrome, left upper limb: Secondary | ICD-10-CM | POA: Diagnosis not present

## 2020-01-04 DIAGNOSIS — Z7982 Long term (current) use of aspirin: Secondary | ICD-10-CM | POA: Diagnosis not present

## 2020-01-04 DIAGNOSIS — Z9049 Acquired absence of other specified parts of digestive tract: Secondary | ICD-10-CM | POA: Insufficient documentation

## 2020-01-04 DIAGNOSIS — I10 Essential (primary) hypertension: Secondary | ICD-10-CM | POA: Diagnosis not present

## 2020-01-04 DIAGNOSIS — Z79899 Other long term (current) drug therapy: Secondary | ICD-10-CM | POA: Diagnosis not present

## 2020-01-04 DIAGNOSIS — Z7984 Long term (current) use of oral hypoglycemic drugs: Secondary | ICD-10-CM | POA: Diagnosis not present

## 2020-01-04 DIAGNOSIS — E785 Hyperlipidemia, unspecified: Secondary | ICD-10-CM | POA: Diagnosis not present

## 2020-01-04 DIAGNOSIS — Z20822 Contact with and (suspected) exposure to covid-19: Secondary | ICD-10-CM | POA: Diagnosis not present

## 2020-01-04 HISTORY — PX: CARPAL TUNNEL RELEASE: SHX101

## 2020-01-04 LAB — GLUCOSE, CAPILLARY: Glucose-Capillary: 124 mg/dL — ABNORMAL HIGH (ref 70–99)

## 2020-01-04 SURGERY — CARPAL TUNNEL RELEASE
Anesthesia: General | Laterality: Left

## 2020-01-04 MED ORDER — DEXAMETHASONE SODIUM PHOSPHATE 10 MG/ML IJ SOLN
INTRAMUSCULAR | Status: DC | PRN
  Administered 2020-01-04: 10 mg via INTRAVENOUS

## 2020-01-04 MED ORDER — PROPOFOL 10 MG/ML IV BOLUS
INTRAVENOUS | Status: AC
  Filled 2020-01-04: qty 20

## 2020-01-04 MED ORDER — MIDAZOLAM HCL 2 MG/2ML IJ SOLN
INTRAMUSCULAR | Status: DC | PRN
  Administered 2020-01-04: 2 mg via INTRAVENOUS

## 2020-01-04 MED ORDER — SODIUM CHLORIDE 0.9 % IV SOLN: INTRAVENOUS | Status: DC

## 2020-01-04 MED ORDER — PHENYLEPHRINE HCL (PRESSORS) 10 MG/ML IV SOLN
INTRAVENOUS | Status: DC | PRN
  Administered 2020-01-04 (×3): 100 ug via INTRAVENOUS

## 2020-01-04 MED ORDER — PROPOFOL 10 MG/ML IV BOLUS
INTRAVENOUS | Status: DC | PRN
  Administered 2020-01-04: 200 mg via INTRAVENOUS

## 2020-01-04 MED ORDER — BUPIVACAINE HCL 0.5 % IJ SOLN
INTRAMUSCULAR | Status: DC | PRN
  Administered 2020-01-04: 10 mL

## 2020-01-04 MED ORDER — BUPIVACAINE HCL (PF) 0.5 % IJ SOLN
INTRAMUSCULAR | Status: AC
  Filled 2020-01-04: qty 30

## 2020-01-04 MED ORDER — HYDROCODONE-ACETAMINOPHEN 5-325 MG PO TABS
1.0000 | ORAL_TABLET | Freq: Four times a day (QID) | ORAL | 0 refills | Status: DC | PRN
Start: 2020-01-04 — End: 2020-10-31

## 2020-01-04 MED ORDER — CHLORHEXIDINE GLUCONATE 0.12 % MT SOLN: 15.0000 mL | Freq: Once | OROMUCOSAL | Status: AC

## 2020-01-04 MED ORDER — ROCURONIUM BROMIDE 100 MG/10ML IV SOLN
INTRAVENOUS | Status: DC | PRN
  Administered 2020-01-04: 30 mg via INTRAVENOUS

## 2020-01-04 MED ORDER — FENTANYL CITRATE (PF) 100 MCG/2ML IJ SOLN
INTRAMUSCULAR | Status: DC | PRN
  Administered 2020-01-04 (×2): 50 ug via INTRAVENOUS

## 2020-01-04 MED ORDER — FAMOTIDINE 20 MG PO TABS: 20.0000 mg | ORAL_TABLET | Freq: Once | ORAL | Status: DC

## 2020-01-04 MED ORDER — MIDAZOLAM HCL 2 MG/2ML IJ SOLN
INTRAMUSCULAR | Status: AC
  Filled 2020-01-04: qty 2

## 2020-01-04 MED ORDER — ORAL CARE MOUTH RINSE: 15.0000 mL | Freq: Once | OROMUCOSAL | Status: AC

## 2020-01-04 MED ORDER — SUGAMMADEX SODIUM 500 MG/5ML IV SOLN
INTRAVENOUS | Status: DC | PRN
  Administered 2020-01-04: 267.6 mg via INTRAVENOUS

## 2020-01-04 MED ORDER — SUCCINYLCHOLINE CHLORIDE 20 MG/ML IJ SOLN
INTRAMUSCULAR | Status: DC | PRN
  Administered 2020-01-04: 140 mg via INTRAVENOUS

## 2020-01-04 MED ORDER — DEXMEDETOMIDINE (PRECEDEX) IN NS 20 MCG/5ML (4 MCG/ML) IV SYRINGE
PREFILLED_SYRINGE | INTRAVENOUS | Status: DC | PRN
  Administered 2020-01-04: 4 ug via INTRAVENOUS

## 2020-01-04 MED ORDER — LIDOCAINE HCL (CARDIAC) PF 100 MG/5ML IV SOSY
PREFILLED_SYRINGE | INTRAVENOUS | Status: DC | PRN
  Administered 2020-01-04: 100 mg via INTRAVENOUS

## 2020-01-04 MED ORDER — ONDANSETRON HCL 4 MG/2ML IJ SOLN
INTRAMUSCULAR | Status: DC | PRN
  Administered 2020-01-04: 4 mg via INTRAVENOUS

## 2020-01-04 MED ORDER — FENTANYL CITRATE (PF) 100 MCG/2ML IJ SOLN
INTRAMUSCULAR | Status: AC
  Filled 2020-01-04: qty 2

## 2020-01-04 MED ORDER — CHLORHEXIDINE GLUCONATE 0.12 % MT SOLN
OROMUCOSAL | Status: AC
  Administered 2020-01-04: 15 mL via OROMUCOSAL
  Filled 2020-01-04: qty 15

## 2020-01-04 SURGICAL SUPPLY — 26 items
APL PRP STRL LF DISP 70% ISPRP (MISCELLANEOUS) ×1
BNDG ELASTIC 3X5.8 VLCR STR LF (GAUZE/BANDAGES/DRESSINGS) ×2 IMPLANT
CANISTER SUCT 1200ML W/VALVE (MISCELLANEOUS) ×2 IMPLANT
CHLORAPREP W/TINT 26 (MISCELLANEOUS) ×2 IMPLANT
COVER WAND RF STERILE (DRAPES) ×2 IMPLANT
CUFF TOURN SGL QUICK 18X4 (TOURNIQUET CUFF) IMPLANT
ELECT CAUTERY NDL 2.0 MIC (NEEDLE) IMPLANT
ELECT CAUTERY NEEDLE 2.0 MIC (NEEDLE) IMPLANT
GAUZE SPONGE 4X4 12PLY STRL (GAUZE/BANDAGES/DRESSINGS) ×2 IMPLANT
GAUZE XEROFORM 1X8 LF (GAUZE/BANDAGES/DRESSINGS) ×2 IMPLANT
GLOVE SURG SYN 9.0  PF PI (GLOVE) ×2
GLOVE SURG SYN 9.0 PF PI (GLOVE) ×1 IMPLANT
GOWN SRG 2XL LVL 4 RGLN SLV (GOWNS) ×1 IMPLANT
GOWN STRL NON-REIN 2XL LVL4 (GOWNS) ×2
GOWN STRL REUS W/ TWL LRG LVL3 (GOWN DISPOSABLE) ×1 IMPLANT
GOWN STRL REUS W/TWL LRG LVL3 (GOWN DISPOSABLE) ×2
KIT TURNOVER KIT A (KITS) ×2 IMPLANT
MANIFOLD NEPTUNE II (INSTRUMENTS) ×2 IMPLANT
NS IRRIG 500ML POUR BTL (IV SOLUTION) ×2 IMPLANT
PACK EXTREMITY ARMC (MISCELLANEOUS) ×2 IMPLANT
PAD CAST CTTN 4X4 STRL (SOFTGOODS) ×1 IMPLANT
PADDING CAST COTTON 4X4 STRL (SOFTGOODS) ×2
SCALPEL PROTECTED #15 DISP (BLADE) ×4 IMPLANT
SUT ETHILON 4-0 (SUTURE) ×2
SUT ETHILON 4-0 FS2 18XMFL BLK (SUTURE) ×1
SUTURE ETHLN 4-0 FS2 18XMF BLK (SUTURE) ×1 IMPLANT

## 2020-01-04 NOTE — Discharge Instructions (Addendum)
Loosen Ace wrap prior to dismissal and over the weekend if fingers swell. Otherwise keep dressing clean and dry. Work on finger motion is much as you can. Pain medicine as directed Call office if you are having any problems.  AMBULATORY SURGERY  DISCHARGE INSTRUCTIONS   1) The drugs that you were given will stay in your system until tomorrow so for the next 24 hours you should not:  A) Drive an automobile B) Make any legal decisions C) Drink any alcoholic beverage   2) You may resume regular meals tomorrow.  Today it is better to start with liquids and gradually work up to solid foods.  You may eat anything you prefer, but it is better to start with liquids, then soup and crackers, and gradually work up to solid foods.   3) Please notify your doctor immediately if you have any unusual bleeding, trouble breathing, redness and pain at the surgery site, drainage, fever, or pain not relieved by medication.    4) Additional Instructions:   Please contact your physician with any problems or Same Day Surgery at [phone], Monday through Friday 6 am to 4 pm, or [place] at [facility] number at [phone].

## 2020-01-04 NOTE — Anesthesia Procedure Notes (Signed)
Procedure Name: Intubation Date/Time: 01/04/2020 12:53 PM Performed by: Lily Peer, Terica Yogi, CRNA Pre-anesthesia Checklist: Patient identified, Emergency Drugs available, Suction available and Patient being monitored Patient Re-evaluated:Patient Re-evaluated prior to induction Oxygen Delivery Method: Circle system utilized Preoxygenation: Pre-oxygenation with 100% oxygen Induction Type: IV induction Ventilation: Two handed mask ventilation required and Oral airway inserted - appropriate to patient size Laryngoscope Size: McGraph and 4 Grade View: Grade I Tube type: Oral Tube size: 7.0 mm Number of attempts: 1 Airway Equipment and Method: Stylet and Oral airway Placement Confirmation: ETT inserted through vocal cords under direct vision,  positive ETCO2 and breath sounds checked- equal and bilateral Secured at: 22 cm Tube secured with: Tape Dental Injury: Teeth and Oropharynx as per pre-operative assessment

## 2020-01-04 NOTE — Transfer of Care (Signed)
Immediate Anesthesia Transfer of Care Note  Patient: BEACHER EVERY  Procedure(s) Performed: CARPAL TUNNEL RELEASE (Left )  Patient Location: PACU  Anesthesia Type:General  Level of Consciousness: awake, alert  and oriented  Airway & Oxygen Therapy: Patient Spontanous Breathing and Patient connected to face mask oxygen  Post-op Assessment: Report given to RN and Post -op Vital signs reviewed and stable  Post vital signs: Reviewed and stable  Last Vitals:  Vitals Value Taken Time  BP 150/84 01/04/20 1335  Temp    Pulse 77 01/04/20 1336  Resp 14 01/04/20 1336  SpO2 98 % 01/04/20 1336  Vitals shown include unvalidated device data.  Last Pain:  Vitals:   01/04/20 1052  TempSrc: Oral  PainSc: 0-No pain         Complications: No complications documented.

## 2020-01-04 NOTE — Anesthesia Preprocedure Evaluation (Signed)
Anesthesia Evaluation  Patient identified by MRN, date of birth, ID band Patient awake    Reviewed: Allergy & Precautions, H&P , NPO status , Patient's Chart, lab work & pertinent test results, reviewed documented beta blocker date and time   History of Anesthesia Complications Negative for: history of anesthetic complications  Airway Mallampati: III  TM Distance: >3 FB Neck ROM: full    Dental  (+) Dental Advidsory Given   Pulmonary neg shortness of breath, sleep apnea and Continuous Positive Airway Pressure Ventilation , neg COPD, neg recent URI, former smoker,    Pulmonary exam normal        Cardiovascular Exercise Tolerance: Good hypertension, (-) angina(-) CAD, (-) Past MI, (-) Cardiac Stents and (-) CABG Normal cardiovascular exam(-) dysrhythmias (-) Valvular Problems/Murmurs     Neuro/Psych PSYCHIATRIC DISORDERS Anxiety negative neurological ROS     GI/Hepatic Neg liver ROS, GERD  ,  Endo/Other  diabetes, Well Controlled, Oral Hypoglycemic AgentsMorbid obesity  Renal/GU negative Renal ROS  negative genitourinary   Musculoskeletal   Abdominal   Peds  Hematology negative hematology ROS (+)   Anesthesia Other Findings Past Medical History: No date: Diabetes mellitus without complication (HCC) No date: GERD (gastroesophageal reflux disease) No date: Hyperlipidemia No date: Hypertension No date: Sleep apnea   Reproductive/Obstetrics negative OB ROS                             Anesthesia Physical  Anesthesia Plan  ASA: III  Anesthesia Plan: General   Post-op Pain Management:    Induction: Intravenous  PONV Risk Score and Plan: 2 and Ondansetron, Dexamethasone, Midazolam and Treatment may vary due to age or medical condition  Airway Management Planned: Oral ETT  Additional Equipment:   Intra-op Plan:   Post-operative Plan: Extubation in OR  Informed Consent: I have  reviewed the patients History and Physical, chart, labs and discussed the procedure including the risks, benefits and alternatives for the proposed anesthesia with the patient or authorized representative who has indicated his/her understanding and acceptance.     Dental Advisory Given  Plan Discussed with: Anesthesiologist, CRNA and Surgeon  Anesthesia Plan Comments:         Anesthesia Quick Evaluation

## 2020-01-04 NOTE — Op Note (Signed)
01/04/2020  1:25 PM  PATIENT:  Larry Edwards  54 y.o. 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  PROCEDURE:  Procedure(s): CARPAL TUNNEL RELEASE (Left)  SURGEON: Laurene Footman, MD  ASSISTANTS: None  ANESTHESIA:   general  EBL:  Total I/O In: 550 [I.V.:500; IV Piggyback:50] Out: 1 [Blood:1]  BLOOD ADMINISTERED:none  DRAINS: none   LOCAL MEDICATIONS USED:  MARCAINE     SPECIMEN:  No Specimen  DISPOSITION OF SPECIMEN:  N/A  COUNTS:  YES  TOURNIQUET: 15 minutes at 250 mmHg  IMPLANTS: none  DICTATION: .Dragon Dictation patient was brought to the operating room and after adequate general anesthesia was obtained a tourniquet was applied to the left upper forearm and the hand was prepped and draped in usual sterile fashion.  After patient identification and timeout procedures were completed tourniquet was raised.  An incision was made in line with the ring metacarpal approximately 2 cm in length subsequently extended proximally another 2 to 3 mm the transverse carpal ligament was identified and small incision made.  Vascular hemostat was placed deep to protect the underlying structure release carried out distally until fat was noted around the nerve proximally at the level of the wrist flexion crease release carried out but there is still no vascular blush to the nerve so the skin incision was extended going about a centimeter proximal to the wrist flexion crease there was a tight area and after this was released there is good vascular blush to the nerve consistent with adequate release. Wound was irrigated and 10 cc of half percent Sensorcaine infiltrated around the incision.  Closed with simple erupted 5-0 nylon skin sutures.  Dressing of Xeroform 4 x 4 web roll and Ace wrap  PLAN OF CARE: Discharge to home after PACU  PATIENT DISPOSITION:  PACU - hemodynamically  stable.

## 2020-01-04 NOTE — H&P (Signed)
Chief Complaint: No chief complaint on file.  Larry Edwards is a 54 y.o. male who presents today for history and physical for left carpal tunnel release with Dr. Hessie Knows at the hospital on 01/04/2020. Patient has chronic moderate severity carpal tunnel syndrome on the left, recently underwent successful right carpal tunnel release. Carpal tunnel symptoms have been present for years. He has numbness and tingling in the median nerve distribution of the right greater than left hand. He has tried nighttime splints with no improvement, despite splinting will still have awakening and discomfort in the mornings. He denies any significant weakness. Recent nerve conduction study showed chronic moderate to severe right and left carpal tunnel syndrome.  Past Medical History: Past Medical History:  Diagnosis Date  . Hypertension  . Sleep apnea   Past Surgical History: Past Surgical History:  Procedure Laterality Date  . CHOLECYSTECTOMY  . ENDOSCOPIC CARPAL TUNNEL RELEASE Right 11/16/2019  Dr. Rudene Christians  . LAMINECTOMY LUMBAR SPINE Right 11/2018  this is the third time he had this (1998, 2006, 2020)  . TONSILLECTOMY   Past Family History: Family History  Family history unknown: Yes   Medications: Current Outpatient Medications Ordered in Epic  Medication Sig Dispense Refill  . aspirin 81 MG EC tablet Take by mouth  . atenoloL (TENORMIN) 50 MG tablet Take by mouth  . cholecalciferol (VITAMIN D3) 1000 unit tablet Take by mouth  . escitalopram oxalate (LEXAPRO) 10 MG tablet Take by mouth  . fluticasone propionate (FLONASE) 50 mcg/actuation nasal spray USE 2 SPRAYS IN EACH NOSTRIL DAILY  . icosapent ethyL (VASCEPA) 1 gram capsule  . lisinopriL (ZESTRIL) 40 MG tablet Take 40 mg by mouth once daily  . metFORMIN (GLUCOPHAGE) 1000 MG tablet TAKE 1 TABLET TWICE A DAY WITH MEALS  . rosuvastatin (CRESTOR) 10 MG tablet Take 1 tablet by mouth once daily   No current Epic-ordered facility-administered  medications on file.   Allergies: No Known Allergies   Review of Systems:  A comprehensive 14 point ROS was performed, reviewed by me today, and the pertinent orthopaedic findings are documented in the HPI.  Exam: There were no vitals taken for this visit. General:  Well developed, well nourished, no apparent distress, normal affect, normal gait with no antalgic component.   HEENT: Head normocephalic, atraumatic, PERRL.   Abdomen: Soft, non tender, non distended, Bowel sounds present.  Heart: Examination of the heart reveals regular, rate, and rhythm. There is no murmur noted on ascultation. There is a normal apical pulse.  Lungs: Lungs are clear to auscultation. There is no wheeze, rhonchi, or crackles. There is normal expansion of bilateral chest walls.   Neck: Neck has full range of motion. There is no tenderness to palpation. Spurling's test is negative.   Left upper Extremity: Normal shoulder contour. Good active and passive range of motion and stability of the shoulder, elbow, and wrist. Normal motion of the hand and digits. No swelling, erythema, or ecchymosis is noted. There is no triggering or locking of the digits noted. No thenar atrophy is visualized. No intrinsic wasting. The patient has a positive bilateral Phalen's test. The patient has a negative bilateral Tinel's test. The patient has normal pinprick and light touch sensation in the median nerve distribution. There is normal grip strength and pincer strength. The patient has less than 2 second capillary refill with good skin warmth. Normal radial and ulnar pulse is palpated. Left elbow with tenderness along the cubital tunnel. Positive bilateral tenderness along the lateral epicondyles  with painful resisted wrist extension bilaterally.  Neurologic: Sensory function is intact, except as noted above. Motor strength is 5/5, except as noted above. No tremor or clonus is present. Good motor coordination is noted.   EMG  nerve conduction studies from September 2021 Impression: Abnormal study. There is electrodiagnostic evidence of chronic, moderate severeity bilateral carpal tunnel syndrome.    Impression: Left carpal tunnel syndrome [G56.02] Left carpal tunnel syndrome (primary encounter diagnosis)  Plan:  1. Risks, benefits, complications of a left carpal tunnel release at the hospital have been discussed with the patient. Patient has agreed and consented procedure with Dr. Kennedy Bucker January 04, 2020.  This note was generated in part with voice recognition software and I apologize for any typographical errors that were not detected and corrected.  Patience Musca MPA-C    Electronically signed by Patience Musca, PA at 01/02/2020 10:42 AM EST    Reviewed  H+P. No changes noted.

## 2020-01-08 DIAGNOSIS — G4733 Obstructive sleep apnea (adult) (pediatric): Secondary | ICD-10-CM | POA: Diagnosis not present

## 2020-01-09 ENCOUNTER — Encounter: Payer: Self-pay | Admitting: Orthopedic Surgery

## 2020-01-10 NOTE — Anesthesia Postprocedure Evaluation (Signed)
Anesthesia Post Note  Patient: Larry Edwards  Procedure(s) Performed: CARPAL TUNNEL RELEASE (Left )  Patient location during evaluation: PACU Anesthesia Type: General Level of consciousness: awake and alert and oriented Pain management: pain level controlled Vital Signs Assessment: post-procedure vital signs reviewed and stable Respiratory status: spontaneous breathing Cardiovascular status: blood pressure returned to baseline Anesthetic complications: no   No complications documented.   Last Vitals:  Vitals:   01/04/20 1433 01/04/20 1453  BP: 140/78   Pulse: 63 69  Resp: 13 16  Temp: (!) 36.3 C 36.6 C  SpO2: 96% 97%    Last Pain:  Vitals:   01/08/20 0815  TempSrc:   PainSc: 2                  Caliah Kopke

## 2020-01-15 NOTE — Progress Notes (Signed)
Formatting of this note is different from the original.    Chief Complaint   Patient presents with   ? Post Operative Visit     S/P Lt CTR       History of the Present Illness:  Andres Johnson is a 55 y.o. male here today for follow-up of his left carpal tunnel release performed on 01/04/2020 at [facility]. The patient comes in today for suture removal.    The patient reports that his left-hand is doing well. He does not have any complaints today.     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    I have reviewed past medical, surgical, social and family history, and allergies as documented in the EMR.    Past Medical History:  Past Medical History:   Diagnosis Date   ? Diabetes (CMS-HCC) 10/11/2014   ? Esophageal reflux 08/26/2006   ? HLD (hyperlipidemia) 08/26/2006   ? Hypertension    ? Sleep apnea      Past Surgical History:  Past Surgical History:   Procedure Laterality Date   ? CHOLECYSTECTOMY     ? ENDOSCOPIC CARPAL TUNNEL RELEASE Right 11/16/2019    Dr. Rosita Kea   ? ENDOSCOPIC CARPAL TUNNEL RELEASE Left 01/04/2020    Rosita Kea   ? LAMINECTOMY LUMBAR SPINE Right 11/2018    this is the third time he had this (1998, 2006, 2020)   ? TONSILLECTOMY       Past Family History:  Family History   Family history unknown: Yes     Medications:  Current Outpatient Medications Ordered in Epic   Medication Sig Dispense Refill   ? aspirin 81 MG EC tablet Take by mouth     ? atenoloL (TENORMIN) 50 MG tablet Take by mouth     ? cholecalciferol (VITAMIN D3) 1000 unit tablet Take by mouth     ? escitalopram oxalate (LEXAPRO) 10 MG tablet Take by mouth     ? fluticasone propionate (FLONASE) 50 mcg/actuation nasal spray USE 2 SPRAYS IN EACH NOSTRIL DAILY     ? HYDROcodone-acetaminophen (NORCO) 5-325 mg tablet Take 1 tablet by mouth every 6 (six) hours as needed     ? icosapent ethyL (VASCEPA) 1 gram capsule      ? lisinopriL (ZESTRIL) 40 MG tablet Take 40 mg by mouth once daily      ? metFORMIN (GLUCOPHAGE) 1000 MG tablet TAKE 1 TABLET TWICE A DAY WITH MEALS     ? rosuvastatin (CRESTOR) 10 MG tablet Take 1 tablet by mouth once daily     ? turmeric root extract 500 mg Cap Take by mouth       No current Epic-ordered facility-administered medications on file.     Allergies:  No Known Allergies     Body mass index is 41.81 kg/m.    Review of Systems:  A comprehensive 14 point ROS was performed, reviewed, and the pertinent orthopaedic findings are documented in the HPI.    Vitals:    01/15/20 1420   BP: 124/70       General Physical Examination:     General/Constitutional: No apparent distress: well-nourished and well developed.  Eyes: Pupils equal, round with synchronous movement.  Lungs: Clear to auscultation  HEENT: Normal  Vascular: No edema, swelling or tenderness, except as noted in detailed exam.  Cardiac:  Heart rate and rhythm is regular.  Integumentary: No impressive skin lesions present, except as  noted in detailed exam.  Neuro/Psych: Normal mood and affect, oriented to person, place and time.    Musculoskeletal Examination:    On examination, the patient has excellent finger movement. He has a well-healed scar.    Radiographs:    No new imaging studies were obtained or reviewed today.    Assessment:    ICD-10-CM   1. Left carpal tunnel syndrome  G56.02   2. Status post carpal tunnel release  Z98.890     Plan:    The patient is doing very well status post left carpal tunnel release. I advised him to massage the skin and apply a ScarAway Silicone patch to leave on at night.     He will follow up as needed.    Attestation:  Jamse Arn, am documenting for  [facility], MD utilizing [employer] DAX.       Electronically signed by Marlena Clipper, MD at 01/15/2020  6:22 PM EST

## 2020-01-15 NOTE — Progress Notes (Signed)
Formatting of this note might be different from the original.  Review of Systems   Constitutional: Negative.    HENT: Negative.    Eyes: Negative.    Respiratory: Negative.    Cardiovascular: Negative.    Gastrointestinal: Negative.    Endocrine: Negative.    Genitourinary: Negative.    Musculoskeletal: Positive for arthralgias and myalgias.   Skin: Negative.    Allergic/Immunologic: Negative.    Neurological: Negative.    Hematological: Negative.    Psychiatric/Behavioral: Negative.      Electronically signed by Harrel Carina, CMA at 01/15/2020  6:22 PM EST

## 2020-02-05 ENCOUNTER — Ambulatory Visit (INDEPENDENT_AMBULATORY_CARE_PROVIDER_SITE_OTHER): Payer: BC Managed Care – PPO | Admitting: Dermatology

## 2020-02-05 ENCOUNTER — Other Ambulatory Visit: Payer: Self-pay

## 2020-02-05 ENCOUNTER — Encounter: Payer: Self-pay | Admitting: Dermatology

## 2020-02-05 DIAGNOSIS — D239 Other benign neoplasm of skin, unspecified: Secondary | ICD-10-CM

## 2020-02-05 DIAGNOSIS — L905 Scar conditions and fibrosis of skin: Secondary | ICD-10-CM | POA: Diagnosis not present

## 2020-02-05 DIAGNOSIS — D235 Other benign neoplasm of skin of trunk: Secondary | ICD-10-CM | POA: Diagnosis not present

## 2020-02-05 NOTE — Progress Notes (Signed)
   Follow-Up Visit   Subjective  Larry Edwards is a 55 y.o. male who presents for the following: Severe Dysplastic Nevus (Right abdomen, excise today.).   The following portions of the chart were reviewed this encounter and updated as appropriate:       Review of Systems:  No other skin or systemic complaints except as noted in HPI or Assessment and Plan.  Objective  Well appearing patient in no apparent distress; mood and affect are within normal limits.  A focused examination was performed including face, abdomen. Relevant physical exam findings are noted in the Assessment and Plan.  Objective  Right Abdomen: Pink biopsy scar   Assessment & Plan  Dysplastic nevus Right Abdomen  Skin excision - Right Abdomen  Lesion length (cm):  1 Lesion width (cm):  0.8 Margin per side (cm):  0.2 Total excision diameter (cm):  1.4 Informed consent: discussed and consent obtained   Timeout: patient name, date of birth, surgical site, and procedure verified   Procedure prep:  Patient was prepped and draped in usual sterile fashion Prep type:  Povidone-iodine Anesthesia: the lesion was anesthetized in a standard fashion   Anesthetic:  1% lidocaine w/ epinephrine 1-100,000 buffered w/ 8.4% NaHCO3 (16cc) Instrument used: #15 blade   Hemostasis achieved with: suture, pressure and electrodesiccation   Outcome: patient tolerated procedure well with no complications   Additional details:  Tag 3:00 medial  Skin repair - Right Abdomen Complexity:  Intermediate Final length (cm):  3.5 Reason for type of repair: reduce tension to allow closure, reduce the risk of dehiscence, infection, and necrosis and reduce subcutaneous dead space and avoid a hematoma   Undermining: edges undermined   Subcutaneous layers (deep stitches):  Suture size:  4-0 Suture type: Vicryl (polyglactin 910)   Stitches:  Buried vertical mattress Fine/surface layer approximation (top stitches):  Suture size:   4-0 Suture type: nylon   Stitches: vertical mattress and simple interrupted   Suture removal (days):  7 Hemostasis achieved with: suture Outcome: patient tolerated procedure well with no complications   Post-procedure details: sterile dressing applied and wound care instructions given   Dressing type: pressure dressing (mupirocin)    Specimen 1 - Surgical pathology Differential Diagnosis: Bx proven Severe dysplastic nevus Check Margins: Yes Pink biopsy scar [id number] Tag 3:00 medial  Return in about 1 week (around 02/12/2020) for suture removal.   I, Jamesetta Orleans, CMA, am acting as scribe for [name], MD .  Documentation: I have reviewed the above documentation for accuracy and completeness, and I agree with the above.  [name] MD

## 2020-02-05 NOTE — Patient Instructions (Signed)

## 2020-02-06 ENCOUNTER — Telehealth: Payer: Self-pay

## 2020-02-06 NOTE — Telephone Encounter (Signed)
Left message for patient to call office if he's having any trouble from surgery yesterday.

## 2020-02-08 DIAGNOSIS — G4733 Obstructive sleep apnea (adult) (pediatric): Secondary | ICD-10-CM | POA: Diagnosis not present

## 2020-02-12 ENCOUNTER — Ambulatory Visit (INDEPENDENT_AMBULATORY_CARE_PROVIDER_SITE_OTHER): Payer: BC Managed Care – PPO | Admitting: Dermatology

## 2020-02-12 ENCOUNTER — Other Ambulatory Visit: Payer: Self-pay

## 2020-02-12 DIAGNOSIS — Z4802 Encounter for removal of sutures: Secondary | ICD-10-CM

## 2020-02-12 DIAGNOSIS — D225 Melanocytic nevi of trunk: Secondary | ICD-10-CM

## 2020-02-12 DIAGNOSIS — D229 Melanocytic nevi, unspecified: Secondary | ICD-10-CM

## 2020-02-12 NOTE — Progress Notes (Signed)
   Follow-Up Visit   Subjective  Larry Edwards is a 55 y.o. male who presents for the following: Post op (Atypical nevus of the right abdomen - margins free.).    The following portions of the chart were reviewed this encounter and updated as appropriate:       Review of Systems:  No other skin or systemic complaints except as noted in HPI or Assessment and Plan.  Objective  Well appearing patient in no apparent distress; mood and affect are within normal limits.  A focused examination was performed including face, abdomen. Relevant physical exam findings are noted in the Assessment and Plan.  Objective  Right Abdomen: Excision site healing well. No evidence of infection.   Assessment & Plan  Atypical nevus Right Abdomen  Severe dysplastic nevus- margins free Wound cleansed, sutures removed, and steri strips applied.  Return as scheduled.   IJamesetta Orleans, CMA, am acting as scribe for [name], MD .  Documentation: I have reviewed the above documentation for accuracy and completeness, and I agree with the above.  [name] MD

## 2020-02-24 ENCOUNTER — Other Ambulatory Visit: Payer: Self-pay | Admitting: Family Medicine

## 2020-02-24 DIAGNOSIS — E781 Pure hyperglyceridemia: Secondary | ICD-10-CM

## 2020-03-10 DIAGNOSIS — G4733 Obstructive sleep apnea (adult) (pediatric): Secondary | ICD-10-CM | POA: Diagnosis not present

## 2020-03-21 IMAGING — MR MR LUMBAR SPINE W/O CM
4 of 5 series · 33 of 48 positions shown · non-contrast
Comparison: MRI 03/21/2004

CLINICAL DATA: Low back pain and right leg pain for 1 year. Prior
history of lumbar surgery.

EXAM:
MRI LUMBAR SPINE WITHOUT CONTRAST
TECHNIQUE: Multiplanar, multisequence MR imaging of the lumbar spine was
performed. No intravenous contrast was administered.

[Series 5: T2 · sagittal · 4.0mm · 0.81mm/px · 8 of 19 slices shown (1 of 2)]
[im 1/19]
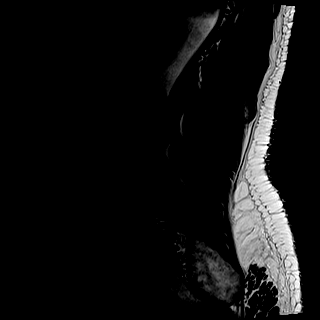
[im 3/19]
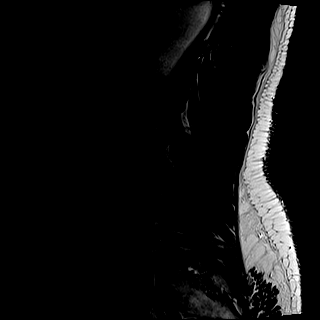
[im 6/19]
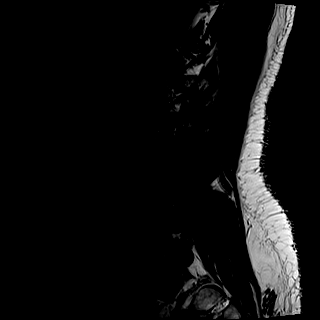
[im 8/19]
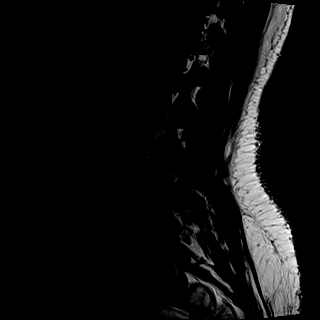
[im 11/19]
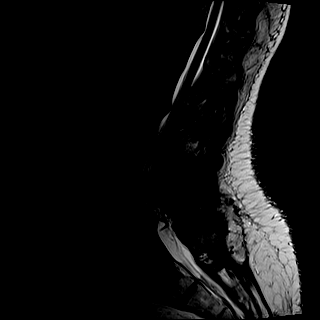
[im 13/19]
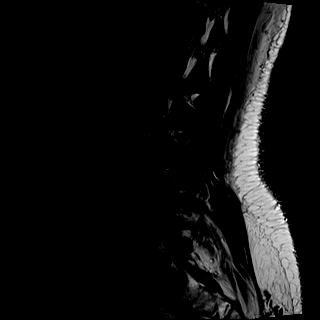
[im 16/19]
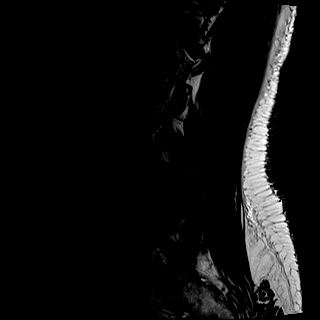
[im 19/19]
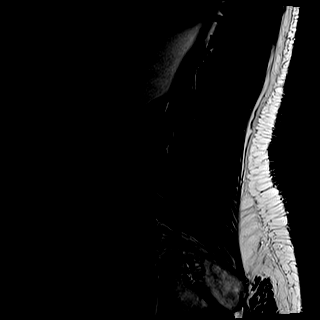

[Series 6: T1 · sagittal · 4.0mm · 0.81mm/px · 7 of 19 slices shown (1 of 2)]
[im 1/19]
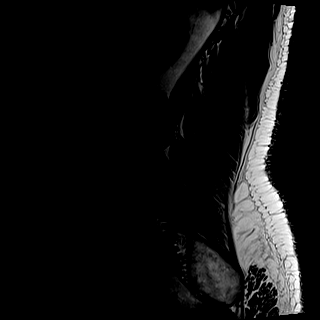
[im 4/19]
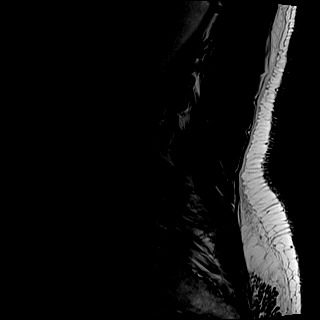
[im 7/19]
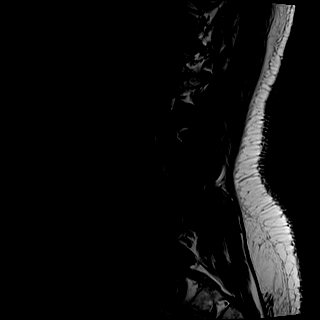
[im 10/19]
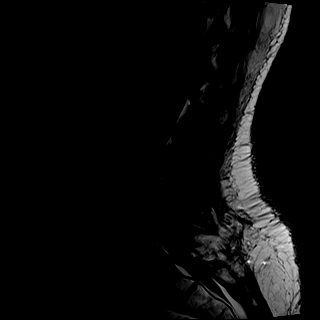
[im 13/19]
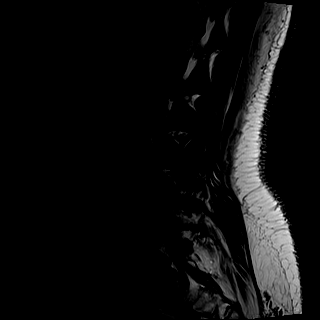
[im 16/19]
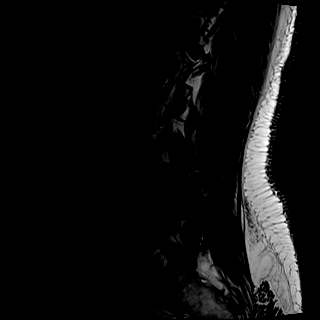
[im 19/19]
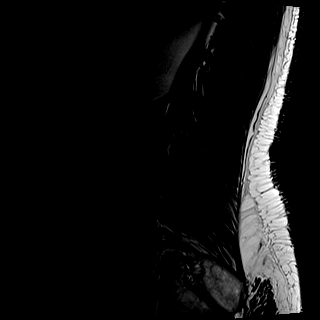

[Series 8: T2 · axial · 4.0mm · 0.78mm/px · z∈[-120,+145]mm · 9 of 36 slices shown (2 of 2)]
[im 1/36]
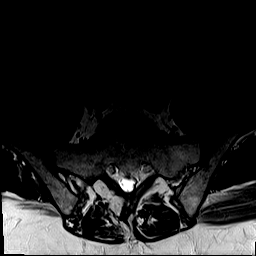
[im 6/36]
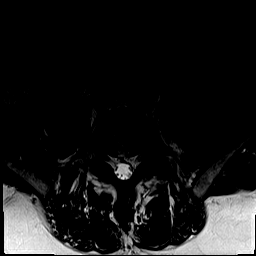
[im 12/36]
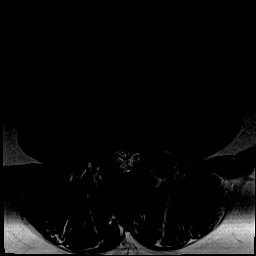
[im 15/36]
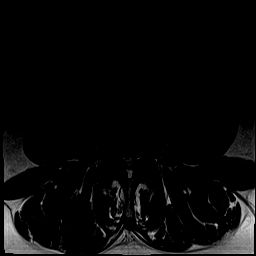
[im 18/36]
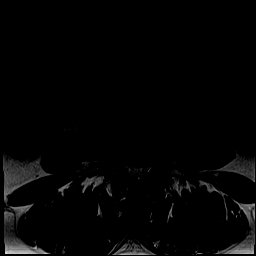
[im 21/36]
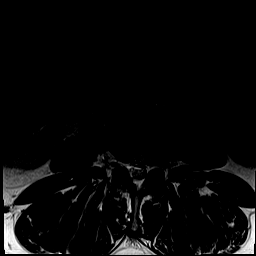
[im 24/36]
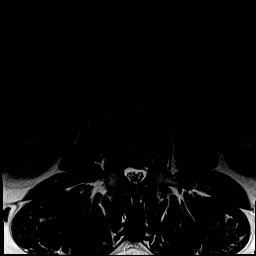
[im 30/36]
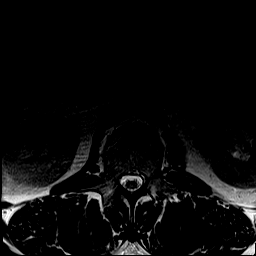
[im 36/36]
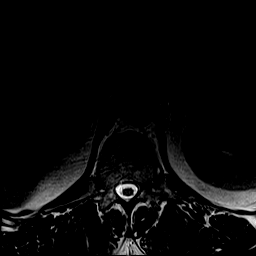

[Series 9: T1 · axial · 4.0mm · 0.39mm/px · z∈[-120,+145]mm · 9 of 36 slices shown (2 of 2)]
[im 1/36]
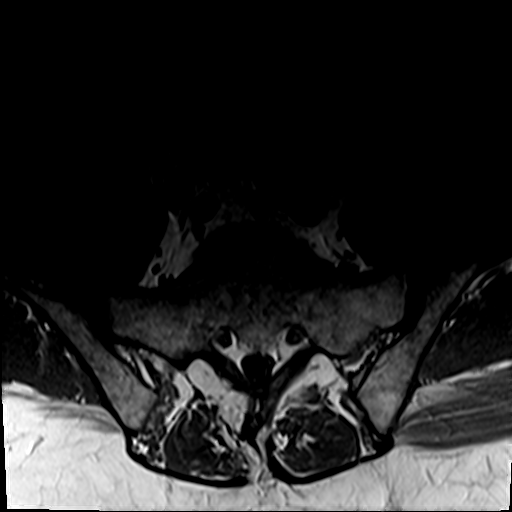
[im 6/36]
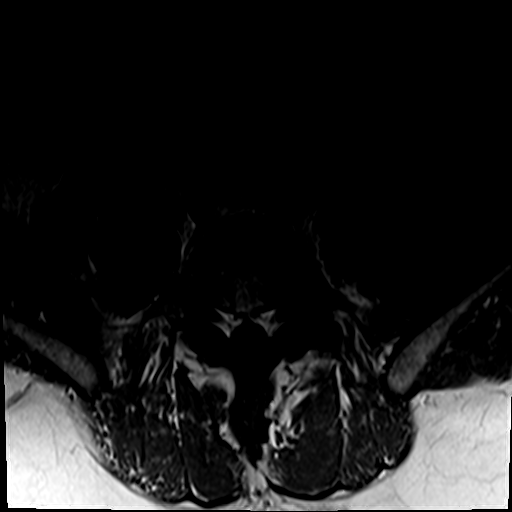
[im 12/36]
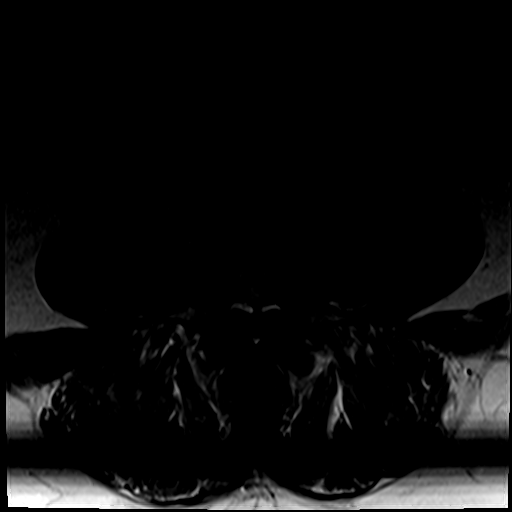
[im 15/36]
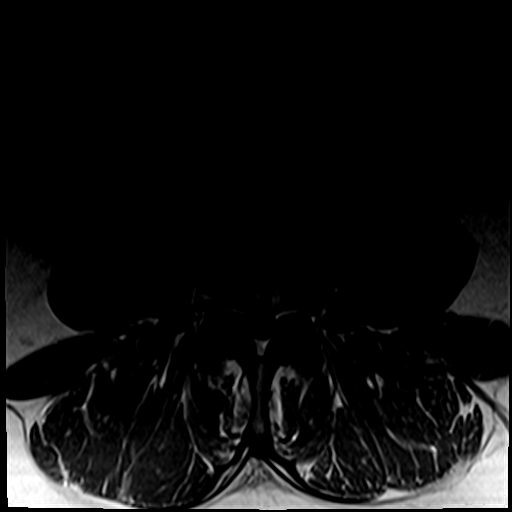
[im 18/36]
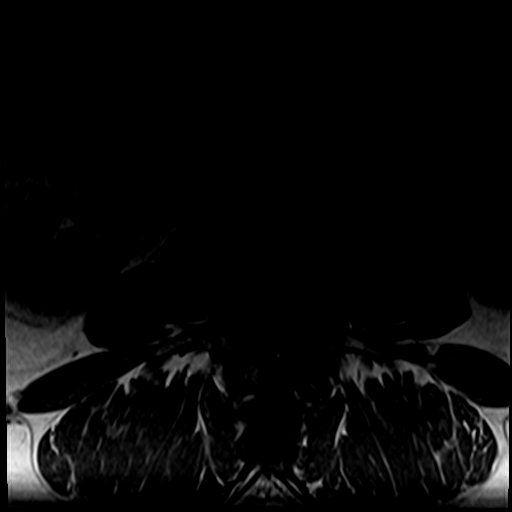
[im 21/36]
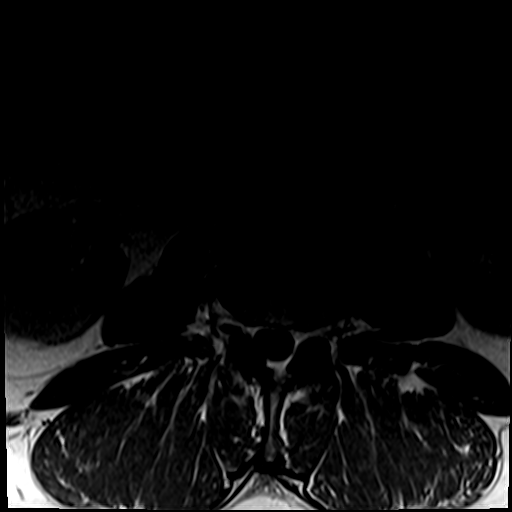
[im 24/36]
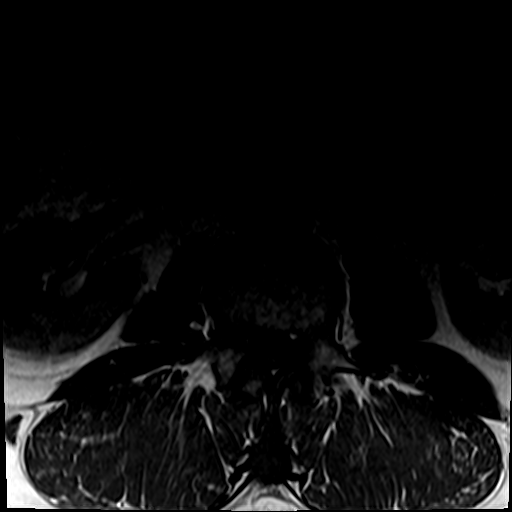
[im 30/36]
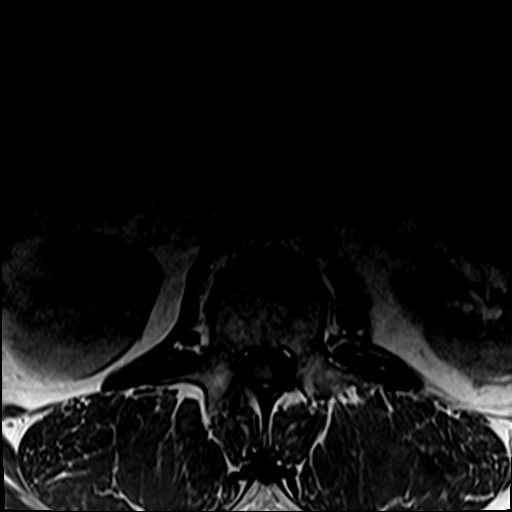
[im 36/36]
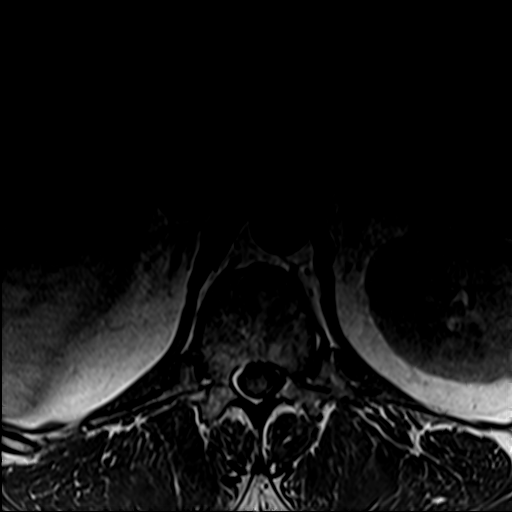

[33 of 48 positions shown; findings below may reference images not displayed]

FINDINGS: Segmentation: There are five lumbar type vertebral bodies. The last
full intervertebral disc space is labeled L5-S1. This correlates
with the prior study.

Alignment:  Normal overall alignment.

Vertebrae:  Normal marrow signal.  No bone lesions or fractures.

Conus medullaris and cauda equina: Conus extends to the L1-2 level.
Conus and cauda equina appear normal.

Paraspinal and other soft tissues: No significant findings.

Disc levels:

T12-L1: Mild facet disease but no disc protrusions, spinal or
foraminal stenosis.

L1-2: Annular bulge with slight flattening of the ventral thecal sac
but no significant spinal, lateral recess or foraminal stenosis.

L2-3: Degenerated bulging annulus, short pedicles, advanced facet
disease and ligamentum flavum thickening all contributing to
moderate to moderately severe spinal and bilateral lateral recess
stenosis. No significant foraminal stenosis. Mild foraminal
encroachment on the left. These findings are new since the prior
study.

L3-4: Bulging degenerated annulus, short pedicles, advanced facet
disease and ligamentum flavum thickening contributing to moderate to
moderately severe spinal and bilateral lateral recess stenosis. No
significant foraminal stenosis. These findings are progressive since
the prior study.

L4-5: Stable moderate-sized disc extrusion with disc material
coursing up above the L4 vertebral body. Stable mass effect on the
ventral thecal sac. Mild foraminal encroachment bilaterally is also
stable. AP small right-sided laminectomy defect is noted.
Significantly progressive facet disease.

L5-S1: Advanced degenerative disc disease and facet disease but the
spinal canal is fairly generous in is no significant spinal or
lateral recess stenosis. There is moderate left foraminal stenosis
due to bony spurring changes.
IMPRESSION: 1. Progressive multifactorial moderate to moderately severe spinal
and bilateral lateral recess stenosis at L2-3 and L3-4.
2. Stable appearing moderate-sized disc extrusion at L4-5.
Right-sided laminectomy defect noted.
3. Progressive advanced degenerative disc disease and facet disease
at L5-S1 with moderate left foraminal stenosis.

## 2020-03-27 ENCOUNTER — Telehealth: Payer: Self-pay | Admitting: Pulmonary Disease

## 2020-03-27 NOTE — Telephone Encounter (Signed)
Pt was called since he was accidentally scheduled @ [place] office. If pt calls back he needs to be made aware of this and if he needs [place] needs to be scheduled w/ an app.

## 2020-04-04 ENCOUNTER — Telehealth: Payer: Self-pay | Admitting: *Deleted

## 2020-04-04 NOTE — Telephone Encounter (Signed)
ATC patient x1, left vm for patient to bring SD card if he has one in his CPAP machine as I do not have a compliance download for him.

## 2020-04-05 ENCOUNTER — Other Ambulatory Visit: Payer: Self-pay

## 2020-04-05 ENCOUNTER — Encounter: Payer: Self-pay | Admitting: Pulmonary Disease

## 2020-04-05 ENCOUNTER — Ambulatory Visit (INDEPENDENT_AMBULATORY_CARE_PROVIDER_SITE_OTHER): Payer: BC Managed Care – PPO | Admitting: Pulmonary Disease

## 2020-04-05 VITALS — BP 128/70 | HR 69 | Temp 97.4°F | Ht 70.0 in | Wt 291.4 lb

## 2020-04-05 DIAGNOSIS — G4733 Obstructive sleep apnea (adult) (pediatric): Secondary | ICD-10-CM

## 2020-04-05 NOTE — Progress Notes (Signed)
@Patient  ID: Larry Edwards, male    DOB: 09-21-1965, 55 y.o.   MRN: [id number]  Chief Complaint  Patient presents with  . Follow-up    Pt has no current respiratory complaints.     Referring provider: Lesleigh Noe, MD  HPI:  55 year old male former smoker followed in our office for severe obstructive sleep apnea  PMH: Vitamin D deficiency, hyperlipidemia, hypertension, GERD, anxiety Smoker/ Smoking History: Former smoker.  Quit 2013.  40-pack-year smoking history Maintenance: None Pt of: Former patient of DR  04/05/2020  - Visit   55 year old male former smoker followed in our office for severe obstructive sleep apnea.  Patient received a new CPAP in December/2021.  He is not enrolled in [place] and he does not have a S DE card.  Patient does have access to a phone app.  Phone app shows that he had an AHI of 2 yesterday.    Patient with no acute distress today.  Patient with no acute respiratory complaints.  Patient reports that sleep apnea is well controlled.  Questionaires / Pulmonary Flowsheets:   ACT:  No flowsheet data found.  MMRC: No flowsheet data found.  Epworth:  No flowsheet data found.  Tests:   Review of sleep study testing from 08/30/2014: Severe sleep apnea with an apnea hypopnea index of 63. He also had a spontaneous arousal index of 12.  -CPAP titration study performed on 11/01/2014: Required CPAP of 18, started in a CPAP titration to help with habituation.  FENO:  No results found for: NITRICOXIDE  PFT: No flowsheet data found.  WALK:  No flowsheet data found.  Imaging: No results found.  Lab Results:  CBC    Component Value Date/Time   WBC 7.1 10/14/2017 0848   WBC 6.1 10/09/2016 0809   RBC 5.04 10/14/2017 0848   RBC 4.61 10/09/2016 0809   HGB 13.5 10/14/2017 0848   HCT 42.2 10/14/2017 0848   PLT 274 10/14/2017 0848   MCV 84 10/14/2017 0848   MCH 26.8 10/14/2017 0848   MCHC 32.0 10/14/2017 0848   MCHC 32.6 10/09/2016 0809    RDW 13.4 10/14/2017 0848   LYMPHSABS 2.5 10/14/2017 0848   MONOABS 0.6 10/09/2016 0809   EOSABS 0.2 10/14/2017 0848   BASOSABS 0.1 10/14/2017 0848    BMET    Component Value Date/Time   NA 136 11/21/2019 0751   NA 140 01/11/2019 0819   K 4.3 11/21/2019 0751   CL 101 11/21/2019 0751   CO2 27 11/21/2019 0751   GLUCOSE 126 (H) 11/21/2019 0751   BUN 14 11/21/2019 0751   BUN 15 01/11/2019 0819   CREATININE 0.94 11/21/2019 0751   CALCIUM 9.2 11/21/2019 0751   GFRNONAA 84 01/11/2019 0819   GFRAA 97 01/11/2019 0819    BNP No results found for: BNP  ProBNP No results found for: PROBNP  Specialty Problems      Pulmonary Problems   HAY FEVER    Qualifier: Diagnosis of  By: Council Mechanic MD, Hilaria Ota       Chronic rhinitis   OSA (obstructive sleep apnea)    Review of sleep study testing from 08/30/2014: Severe sleep apnea with an apnea hypopnea index of 63. He also had a spontaneous arousal index of 12.  -CPAP titration study performed on 11/01/2014: Required CPAP of 18, started in a CPAP titration to help with habituation.         No Known Allergies  Immunization History  Administered Date(s) Administered  .  Moderna Sars-Covid-2 Vaccination 03/24/2019, 04/25/2019, 01/01/2020  . Pneumococcal Polysaccharide-23 10/14/2015  . Td 07/05/2002  . Tdap 10/18/2017    Past Medical History:  Diagnosis Date  . Basal cell carcinoma 11/29/2018   left spinal upper back/superficial, right shoulder/superficial  . Diabetes mellitus without complication ([place])   . Dysplastic nevus 12/04/2019   R abdomen, severe  . GERD (gastroesophageal reflux disease)   . Hx of dysplastic nevus 12/03/2011   R shoulder, severe  . Hyperlipidemia   . Hypertension   . Melanoma ([place]) 02/07/2009   upper back/in situ/excision  . Sleep apnea     Tobacco History: Social History   Tobacco Use  Smoking Status Former Smoker  . Packs/day: 2.00  . Years: 20.00  . Pack years: 40.00  . Types:  Cigarettes  . Quit date: 02/25/2011  . Years since quitting: 9.1  Smokeless Tobacco Former Air traffic controller given: Not Answered   Continue to not smoke  Outpatient Encounter Medications as of 04/05/2020  Medication Sig  . aspirin EC 81 MG tablet Take 81 mg by mouth daily. Swallow whole.  Marland Kitchen atenolol (TENORMIN) 50 MG tablet Take 1 tablet (50 mg total) by mouth daily.  . cholecalciferol (VITAMIN D) 1000 UNITS tablet Take 2,000 Units by mouth daily.  Marland Kitchen escitalopram (LEXAPRO) 20 MG tablet TAKE 1 TABLET DAILY  . fluticasone (FLONASE) 50 MCG/ACT nasal spray Place 2 sprays into both nostrils daily. (Patient taking differently: Place 2 sprays into both nostrils daily as needed for allergies.)  . HYDROcodone-acetaminophen (NORCO) 5-325 MG tablet Take 1 tablet by mouth every 6 (six) hours as needed.  Marland Kitchen icosapent Ethyl (VASCEPA) 1 g capsule Take 2 capsules (2 g total) by mouth daily.  Marland Kitchen lisinopril (ZESTRIL) 40 MG tablet Take 1 tablet (40 mg total) by mouth daily.  . metFORMIN (GLUCOPHAGE) 1000 MG tablet TAKE 1 TABLET TWICE A DAY WITH MEALS  . rosuvastatin (CRESTOR) 10 MG tablet Take 1 tablet (10 mg total) by mouth daily.  . Turmeric 500 MG CAPS Take 500 mg by mouth in the morning and at bedtime.   No facility-administered encounter medications on file as of 04/05/2020.     Review of Systems  Review of Systems  Constitutional: Negative for activity change, chills, fatigue, fever and unexpected weight change.  HENT: Negative for postnasal drip, rhinorrhea, sinus pressure, sinus pain and sore throat.   Eyes: Negative.   Respiratory: Negative for cough, shortness of breath and wheezing.   Cardiovascular: Negative for chest pain and palpitations.  Gastrointestinal: Negative for constipation, diarrhea, nausea and vomiting.  Endocrine: Negative.   Genitourinary: Negative.   Musculoskeletal: Negative.   Skin: Negative.   Neurological: Negative for dizziness and headaches.  Psychiatric/Behavioral:  Negative.  Negative for dysphoric mood. The patient is not nervous/anxious.   All other systems reviewed and are negative.    Physical Exam  BP 128/70 (BP Location: Right Arm, Cuff Size: Normal)   Pulse 69   Temp (!) 97.4 F (36.3 C) (Temporal)   Ht 5\' 10"  (1.778 m)   Wt 291 lb 6.4 oz (132.2 kg)   SpO2 96%   BMI 41.81 kg/m   Wt Readings from Last 5 Encounters:  04/05/20 291 lb 6.4 oz (132.2 kg)  01/04/20 295 lb (133.8 kg)  12/28/19 290 lb (131.5 kg)  11/16/19 291 lb 0.1 oz (132 kg)  11/06/19 290 lb (131.5 kg)    BMI Readings from Last 5 Encounters:  04/05/20 41.81 kg/m  01/04/20 42.33 kg/m  12/28/19  41.61 kg/m  11/16/19 41.76 kg/m  11/06/19 41.61 kg/m     Physical Exam Vitals and nursing note reviewed.  Constitutional:      General: He is not in acute distress.    Appearance: Normal appearance. He is obese.  HENT:     Head: Normocephalic and atraumatic.     Right Ear: Hearing and external ear normal.     Left Ear: Hearing and external ear normal.     Nose: Nose normal. No mucosal edema or rhinorrhea.     Right Turbinates: Not enlarged.     Left Turbinates: Not enlarged.     Mouth/Throat:     Mouth: Mucous membranes are dry.     Pharynx: Oropharynx is clear. No oropharyngeal exudate.  Eyes:     Pupils: Pupils are equal, round, and reactive to light.  Cardiovascular:     Rate and Rhythm: Normal rate and regular rhythm.     Pulses: Normal pulses.     Heart sounds: Normal heart sounds. No murmur heard.   Pulmonary:     Effort: Pulmonary effort is normal.     Breath sounds: Normal breath sounds. No decreased breath sounds, wheezing or rales.  Musculoskeletal:     Cervical back: Normal range of motion.     Right lower leg: No edema.     Left lower leg: No edema.  Lymphadenopathy:     Cervical: No cervical adenopathy.  Skin:    General: Skin is warm and dry.     Capillary Refill: Capillary refill takes less than 2 seconds.     Findings: No erythema or  rash.  Neurological:     General: No focal deficit present.     Mental Status: He is alert and oriented to person, place, and time.     Motor: No weakness.     Coordination: Coordination normal.     Gait: Gait is intact. Gait normal.  Psychiatric:        Mood and Affect: Mood normal.        Behavior: Behavior normal. Behavior is cooperative.        Thought Content: Thought content normal.        Judgment: Judgment normal.       Assessment & Plan:   OSA (obstructive sleep apnea) Plan: Continue CPAP therapy We will send message to [facility] office to see if we can get you set up for [facility] or an S D card. Mask of choice Follow-up in 1 year    Return in about 1 year (around 04/05/2021), or if symptoms worsen or fail to improve.   Lauraine Rinne, NP 04/05/2020   This appointment required 25 minutes of patient care (this includes precharting, chart review, review of results, face-to-face care, etc.).

## 2020-04-05 NOTE — Assessment & Plan Note (Signed)
Plan: Continue CPAP therapy We will send message to [facility] office to see if we can get you set up for [facility] or an S D card. Mask of choice Follow-up in 1 year

## 2020-04-05 NOTE — Telephone Encounter (Signed)
Left VM for pt to follow up about appt not being in [place] today and needing chip. Pending call back.

## 2020-04-05 NOTE — Patient Instructions (Addendum)
You were seen today by Lauraine Rinne, NP  for:   1. OSA (obstructive sleep apnea)  We will contact [place] office to ensure that you are either added Airview or an SD card is placed  We recommend that you continue using your CPAP daily >>>Keep up the hard work using your device >>> Goal should be wearing this for the entire night that you are sleeping, at least 4 to 6 hours  Remember:  . Do not drive or operate heavy machinery if tired or drowsy.  . Please notify the supply company and office if you are unable to use your device regularly due to missing supplies or machine being broken.  . Work on maintaining a healthy weight and following your recommended nutrition plan  . Maintain proper daily exercise and movement  . Maintaining proper use of your device can also help improve management of other chronic illnesses such as: Blood pressure, blood sugars, and weight management.   BiPAP/ CPAP Cleaning:  >>>Clean weekly, with Dawn soap, and bottle brush.  Set up to air dry. >>> Wipe mask out daily with wet wipe or towelette    Follow Up:    Return in about 1 year (around 04/05/2021), or if symptoms worsen or fail to improve.   Notification of test results are managed in the following manner: If there are  any recommendations or changes to the  plan of care discussed in office today,  we will contact you and let you know what they are. If you do not hear from [place], then your results are normal and you can view them through your  MyChart account , or a letter will be sent to you. Thank you again for trusting [place] with your care  - Thank you, [facility]    It is flu season:   >>> Best ways to protect herself from the flu: Receive the yearly flu vaccine, practice good hand hygiene washing with soap and also using hand sanitizer when available, eat a nutritious meals, get adequate rest, hydrate appropriately       Please contact the office if your symptoms worsen or you have  concerns that you are not improving.   Thank you for choosing [facility] for your healthcare, and for allowing [place] to partner with you on your healthcare journey. I am thankful to be able to provide care to you today.   Wyn Quaker FNP-C

## 2020-04-07 DIAGNOSIS — G4733 Obstructive sleep apnea (adult) (pediatric): Secondary | ICD-10-CM | POA: Diagnosis not present

## 2020-04-08 ENCOUNTER — Telehealth: Payer: Self-pay

## 2020-04-08 ENCOUNTER — Other Ambulatory Visit: Payer: Self-pay

## 2020-04-08 DIAGNOSIS — G4733 Obstructive sleep apnea (adult) (pediatric): Secondary | ICD-10-CM

## 2020-04-08 NOTE — Telephone Encounter (Signed)
-----   Message from Lauraine Rinne, NP sent at 04/05/2020  4:51 PM EDT ----- Regarding: CPAP compliance Ketan Renz,  Can we see if this patient can get added to [place] or if we can order a SD card.   Patient also needs 1 year follow-up with sleep MD in [facility]

## 2020-04-08 NOTE — Progress Notes (Signed)
Reviewed and agree with assessment/plan.   Chesley Mires, MD [facility] Pulmonary/Critical Care 04/08/2020, 8:42 AM Pager:  [phone]

## 2020-04-08 NOTE — Telephone Encounter (Signed)
Spoke to [place] with [employer].  Patient has been added to airview.  Nothing further needed at this time.

## 2020-04-08 NOTE — Telephone Encounter (Signed)
Recall has been placed for 1 year. Lm for Brad with [employer] to have patient added to airview.

## 2020-05-08 DIAGNOSIS — G4733 Obstructive sleep apnea (adult) (pediatric): Secondary | ICD-10-CM | POA: Diagnosis not present

## 2020-05-08 NOTE — Addendum Note (Signed)
Addended by: Claudette Head A on: 05/08/2020 12:44 PM   Modules accepted: Orders

## 2020-05-19 ENCOUNTER — Other Ambulatory Visit: Payer: Self-pay | Admitting: Family Medicine

## 2020-06-07 DIAGNOSIS — G4733 Obstructive sleep apnea (adult) (pediatric): Secondary | ICD-10-CM | POA: Diagnosis not present

## 2020-07-08 DIAGNOSIS — G4733 Obstructive sleep apnea (adult) (pediatric): Secondary | ICD-10-CM | POA: Diagnosis not present

## 2020-07-19 DIAGNOSIS — H903 Sensorineural hearing loss, bilateral: Secondary | ICD-10-CM | POA: Diagnosis not present

## 2020-08-07 DIAGNOSIS — G4733 Obstructive sleep apnea (adult) (pediatric): Secondary | ICD-10-CM | POA: Diagnosis not present

## 2020-08-25 ENCOUNTER — Other Ambulatory Visit: Payer: Self-pay | Admitting: Family Medicine

## 2020-08-25 DIAGNOSIS — E782 Mixed hyperlipidemia: Secondary | ICD-10-CM

## 2020-08-25 DIAGNOSIS — E781 Pure hyperglyceridemia: Secondary | ICD-10-CM

## 2020-09-07 DIAGNOSIS — G4733 Obstructive sleep apnea (adult) (pediatric): Secondary | ICD-10-CM | POA: Diagnosis not present

## 2020-10-04 DIAGNOSIS — H524 Presbyopia: Secondary | ICD-10-CM | POA: Diagnosis not present

## 2020-10-04 DIAGNOSIS — H52223 Regular astigmatism, bilateral: Secondary | ICD-10-CM | POA: Diagnosis not present

## 2020-10-04 DIAGNOSIS — E119 Type 2 diabetes mellitus without complications: Secondary | ICD-10-CM | POA: Diagnosis not present

## 2020-10-04 DIAGNOSIS — H01022 Squamous blepharitis right lower eyelid: Secondary | ICD-10-CM | POA: Diagnosis not present

## 2020-10-04 LAB — HM DIABETES EYE EXAM

## 2020-10-08 DIAGNOSIS — G4733 Obstructive sleep apnea (adult) (pediatric): Secondary | ICD-10-CM | POA: Diagnosis not present

## 2020-10-24 LAB — HM DIABETES EYE EXAM

## 2020-10-31 ENCOUNTER — Ambulatory Visit (INDEPENDENT_AMBULATORY_CARE_PROVIDER_SITE_OTHER): Payer: BC Managed Care – PPO | Admitting: Family Medicine

## 2020-10-31 ENCOUNTER — Encounter: Payer: Self-pay | Admitting: Family Medicine

## 2020-10-31 ENCOUNTER — Other Ambulatory Visit: Payer: Self-pay

## 2020-10-31 VITALS — BP 136/76 | HR 66 | Temp 97.2°F | Ht 69.0 in | Wt 294.0 lb

## 2020-10-31 DIAGNOSIS — E1169 Type 2 diabetes mellitus with other specified complication: Secondary | ICD-10-CM | POA: Diagnosis not present

## 2020-10-31 DIAGNOSIS — Z125 Encounter for screening for malignant neoplasm of prostate: Secondary | ICD-10-CM

## 2020-10-31 DIAGNOSIS — Z0000 Encounter for general adult medical examination without abnormal findings: Secondary | ICD-10-CM | POA: Diagnosis not present

## 2020-10-31 DIAGNOSIS — E785 Hyperlipidemia, unspecified: Secondary | ICD-10-CM | POA: Diagnosis not present

## 2020-10-31 DIAGNOSIS — Z87891 Personal history of nicotine dependence: Secondary | ICD-10-CM

## 2020-10-31 DIAGNOSIS — G4733 Obstructive sleep apnea (adult) (pediatric): Secondary | ICD-10-CM | POA: Diagnosis not present

## 2020-10-31 DIAGNOSIS — I10 Essential (primary) hypertension: Secondary | ICD-10-CM

## 2020-10-31 LAB — CBC
HCT: 41.4 % (ref 39.0–52.0)
Hemoglobin: 13.6 g/dL (ref 13.0–17.0)
MCHC: 32.9 g/dL (ref 30.0–36.0)
MCV: 86 fl (ref 78.0–100.0)
Platelets: 218 10*3/uL (ref 150.0–400.0)
RBC: 4.81 Mil/uL (ref 4.22–5.81)
RDW: 14.4 % (ref 11.5–15.5)
WBC: 5.9 10*3/uL (ref 4.0–10.5)

## 2020-10-31 LAB — LIPID PANEL
Cholesterol: 139 mg/dL (ref 0–200)
HDL: 33.4 mg/dL — ABNORMAL LOW (ref >39.00)
NonHDL: 105.87
Total CHOL/HDL Ratio: 4
Triglycerides: 268 mg/dL — ABNORMAL HIGH (ref 0.0–149.0)
VLDL: 53.6 mg/dL — ABNORMAL HIGH (ref 0.0–40.0)

## 2020-10-31 LAB — PSA: PSA: 0.49 ng/mL (ref 0.10–4.00)

## 2020-10-31 LAB — LDL CHOLESTEROL, DIRECT: Direct LDL: 71 mg/dL

## 2020-10-31 LAB — COMPREHENSIVE METABOLIC PANEL
ALT: 29 U/L (ref 0–53)
AST: 23 U/L (ref 0–37)
Albumin: 4.7 g/dL (ref 3.5–5.2)
Alkaline Phosphatase: 38 U/L — ABNORMAL LOW (ref 39–117)
BUN: 15 mg/dL (ref 6–23)
CO2: 25 mEq/L (ref 19–32)
Calcium: 9.5 mg/dL (ref 8.4–10.5)
Chloride: 103 mEq/L (ref 96–112)
Creatinine, Ser: 0.95 mg/dL (ref 0.40–1.50)
GFR: 90.71 mL/min (ref >60.00)
Glucose, Bld: 114 mg/dL — ABNORMAL HIGH (ref 70–99)
Potassium: 4.5 mEq/L (ref 3.5–5.1)
Sodium: 138 mEq/L (ref 135–145)
Total Bilirubin: 0.4 mg/dL (ref 0.2–1.2)
Total Protein: 6.8 g/dL (ref 6.0–8.3)

## 2020-10-31 LAB — HEMOGLOBIN A1C: Hgb A1c MFr Bld: 7.1 % — ABNORMAL HIGH (ref 4.6–6.5)

## 2020-10-31 NOTE — Progress Notes (Signed)
Annual Exam   Chief Complaint:  Chief Complaint  Patient presents with   Annual Exam    History of Present Illness:  Larry Edwards is a 56 y.o. presents today for annual examination.     Nutrition/Lifestyle Diet: not good Exercise: not currently He is not sexually active.  Any issues with getting or keeping erection? No  Social History   Tobacco Use  Smoking Status Former   Packs/day: 2.00   Years: 20.00   Pack years: 40.00   Types: Cigarettes   Quit date: 02/25/2011   Years since quitting: 9.6  Smokeless Tobacco Former   Social History   Substance and Sexual Activity  Alcohol Use Yes   Alcohol/week: 0.0 standard drinks   Comment: 1-2 beers per week   Social History   Substance and Sexual Activity  Drug Use No     Safety The patient wears seatbelts: yes.     The patient feels safe at home and in their relationships: yes.  General Health Dentist in the last year: Yes Eye doctor: yes  Weight Wt Readings from Last 3 Encounters:  10/31/20 294 lb (133.4 kg)  04/05/20 291 lb 6.4 oz (132.2 kg)  01/04/20 295 lb (133.8 kg)   Patient has high BMI  BMI Readings from Last 1 Encounters:  10/31/20 43.42 kg/m     Chronic disease screening Blood pressure monitoring:  BP Readings from Last 3 Encounters:  10/31/20 136/76  04/05/20 128/70  01/04/20 140/78    Lipid Monitoring: Indication for screening: age >35, obesity, diabetes, family hx, CV risk factors.  Lipid screening: Yes  Lab Results  Component Value Date   CHOL 137 11/21/2019   HDL 29.40 (L) 11/21/2019   LDLCALC 78 01/11/2019   LDLDIRECT 72.0 11/21/2019   TRIG 259.0 (H) 11/21/2019   CHOLHDL 5 11/21/2019     Diabetes Screening: age >55, overweight, family hx, PCOS, hx of gestational diabetes, at risk ethnicity, elevated blood pressure >135/80.  Diabetes Screening screening: Yes  Lab Results  Component Value Date   HGBA1C 6.3 (A) 07/06/2019     Prostate Cancer Screening: Yes Age  49-69 yo Shared Decision Making Higher Risk: Older age, African American, Family Hx of Prostate Cancer - No Benefits: screening may prevent 1.3 deaths from prostate cancer over 13 years per 1000 men screened and prevent 3 metastatic cases per 1000 men screened. Not enough evidence to support more benefit for AA or [place] Harms: False Positive and psychological harms. 15% of me with false positive over a 2 to 4 year period > resulting in biopsy and complications such as pain, hematospermia, infections. Overdiagnosis - increases with age - found that 20-50% of prostate cancer through screening may have never caused any issues. Harms of treatment include - erectile dysfunction, urinary incontinence, and bothersome bowel symptoms.   After discussion he does want to get a PSA checked today.   Inadequate evidence for screening <55 No mortality benefit for screening >70   Lab Results  Component Value Date   PSA1 0.6 10/14/2017   PSA 0.42 10/24/2018   PSA 0.49 10/09/2016   PSA 0.50 10/08/2015       Colon Cancer Screening:  Age 48-75 yo - benefits outweigh the risk. Adults 59-85 yo who have never been screened benefit.  Benefits: 134000 people in 2016 will be diagnosed and 49,000 will die - early detection helps Harms: Complications 2/2 to colonoscopy High Risk (Colonoscopy): genetic disorder (Lynch syndrome or familial adenomatous polyposis), personal hx of IBD,  previous adenomatous polyp, or previous colorectal cancer, FamHx start 10 years before the age at diagnosis, increased in males and black race  Options:  FIT - looks for hemoglobin (blood in the stool) - specific and fairly sensitive - must be done annually Cologuard - looks for DNA and blood - more sensitive - therefore can have more false positives, every 3 years Colonoscopy - every 10 years if normal - sedation, bowl prep, must have someone drive you  Shared decision making and the patient had decided to do colonoscopy - due  2023.   Social History   Tobacco Use  Smoking Status Former   Packs/day: 2.00   Years: 20.00   Pack years: 40.00   Types: Cigarettes   Quit date: 02/25/2011   Years since quitting: 9.6  Smokeless Tobacco Former    Lung Cancer Screening (Ages 52-80): yes 20 year pack history? Yes Current Tobacco user? No Quit less than 15 years ago? Yes Interested in low dose CT for lung cancer screening? yes  Abdominal Aortic Aneurysm:  Age 40-75, 1 time screening, men who have ever smoked - will consider later    Past Medical History:  Diagnosis Date   Basal cell carcinoma 11/29/2018   left spinal upper back/superficial, right shoulder/superficial   Diabetes mellitus without complication ([place])    Dysplastic nevus 12/04/2019   R abdomen, severe   GERD (gastroesophageal reflux disease)    Hx of dysplastic nevus 12/03/2011   R shoulder, severe   Hyperlipidemia    Hypertension    Melanoma ([place]) 02/07/2009   upper back/in situ/excision   Sleep apnea     Past Surgical History:  Procedure Laterality Date   BACK SURGERY     x 3   CARPAL TUNNEL RELEASE Right 11/16/2019   Procedure: CARPAL TUNNEL RELEASE;  Surgeon: Hessie Knows, MD;  Location: [facility] ORS;  Service: Orthopedics;  Laterality: Right;   CARPAL TUNNEL RELEASE Left 01/04/2020   Procedure: CARPAL TUNNEL RELEASE;  Surgeon: Hessie Knows, MD;  Location: [facility] ORS;  Service: Orthopedics;  Laterality: Left;   CHOLECYSTECTOMY  01/1995   COLONOSCOPY WITH PROPOFOL N/A 10/30/2016   Procedure: COLONOSCOPY WITH PROPOFOL;  Surgeon: [name], MD;  Location: [facility] ENDOSCOPY;  Service: Gastroenterology;  Laterality: N/A;   LUMBAR DISC SURGERY  05/2004   TONSILLECTOMY AND ADENOIDECTOMY     age 64    Prior to Admission medications   Medication Sig Start Date End Date Taking? Authorizing Provider  aspirin EC 81 MG tablet Take 81 mg by mouth daily. Swallow whole.   Yes [provider]  atenolol (TENORMIN) 50 MG tablet TAKE 1 TABLET  DAILY 05/20/20  Yes Lesleigh Noe, MD  cholecalciferol (VITAMIN D) 1000 UNITS tablet Take 2,000 Units by mouth daily.   Yes [provider]  escitalopram (LEXAPRO) 20 MG tablet TAKE 1 TABLET DAILY 08/27/20  Yes Lesleigh Noe, MD  fluticasone (FLONASE) 50 MCG/ACT nasal spray Place 2 sprays into both nostrils daily. Patient taking differently: Place 2 sprays into both nostrils daily as needed for allergies. 07/31/19  Yes Lesleigh Noe, MD  icosapent Ethyl (VASCEPA) 1 g capsule TAKE 2 CAPSULES DAILY 08/27/20  Yes Lesleigh Noe, MD  lisinopril (ZESTRIL) 40 MG tablet TAKE 1 TABLET DAILY 08/27/20  Yes Lesleigh Noe, MD  metFORMIN (GLUCOPHAGE) 1000 MG tablet TAKE 1 TABLET TWICE A DAY WITH MEALS 08/27/20  Yes Lesleigh Noe, MD  rosuvastatin (CRESTOR) 10 MG tablet TAKE 1 TABLET DAILY 08/27/20  Yes  Lesleigh Noe, MD  Turmeric 500 MG CAPS Take 500 mg by mouth in the morning and at bedtime.   Yes [provider]    No Known Allergies   Social History   Socioeconomic History   Marital status: Married    Spouse name: Amy   Number of children: Not on file   Years of education: College   Highest education level: Not on file  Occupational History   Not on file  Tobacco Use   Smoking status: Former    Packs/day: 2.00    Years: 20.00    Pack years: 40.00    Types: Cigarettes    Quit date: 02/25/2011    Years since quitting: 9.6   Smokeless tobacco: Former  Scientific laboratory technician Use: Never used  Substance and Sexual Activity   Alcohol use: Yes    Alcohol/week: 0.0 standard drinks    Comment: 1-2 beers per week   Drug use: No   Sexual activity: Not Currently  Other Topics Concern   Not on file  Social History Narrative   04/05/19   From: the area   Living: with wife, Amy (2006)   Work: Charity fundraiser      Family: sister nearby - not a good relationship      Enjoys: try to relax, [place], home projects      Exercise: not currently   Diet: meat and  2 veggies - tries to do Government social research officer belts: Yes    Guns: Yes  and secure   Safe in relationships: Yes    Social Determinants of Radio broadcast assistant Strain: Not on file  Food Insecurity: Not on file  Transportation Needs: Not on file  Physical Activity: Not on file  Stress: Not on file  Social Connections: Not on file  Intimate Partner Violence: Not on file    Family History  Problem Relation Age of Onset   Diabetes Mother    Ovarian cancer Mother        ovarian   Diabetes Father    Congestive Heart Failure Father    Congestive Heart Failure Maternal Grandmother    Stroke Maternal Grandfather 32   Heart attack Maternal Grandfather    Stroke Paternal Grandfather 3   Heart attack Paternal Grandfather     Review of Systems  Constitutional:  Negative for chills and fever.  HENT:  Negative for congestion and sore throat.   Eyes:  Negative for blurred vision and double vision.  Respiratory:  Negative for shortness of breath.   Cardiovascular:  Negative for chest pain.  Gastrointestinal:  Negative for heartburn, nausea and vomiting.  Genitourinary: Negative.   Musculoskeletal:  Positive for joint pain. Negative for myalgias.  Skin:  Negative for rash.  Neurological:  Negative for dizziness and headaches.  Endo/Heme/Allergies:  Does not bruise/bleed easily.  Psychiatric/Behavioral:  Negative for depression. The patient is not nervous/anxious.     Physical Exam BP 136/76 (BP Location: Left Arm, Patient Position: Sitting, Cuff Size: Large)   Pulse 66   Temp (!) 97.2 F (36.2 C)   Ht 5\' 9"  (1.753 m)   Wt 294 lb (133.4 kg)   SpO2 98%   BMI 43.42 kg/m    BP Readings from Last 3 Encounters:  10/31/20 136/76  04/05/20 128/70  01/04/20 140/78      Physical Exam Constitutional:      General: He is not  in acute distress.    Appearance: He is well-developed. He is obese. He is not diaphoretic.  HENT:     Head: Normocephalic and  atraumatic.     Right Ear: Ear canal and external ear normal.     Left Ear: Ear canal and external ear normal.     Nose: Nose normal.     Mouth/Throat:     Pharynx: Uvula midline.  Eyes:     General: No scleral icterus.    Conjunctiva/sclera: Conjunctivae normal.     Pupils: Pupils are equal, round, and reactive to light.  Cardiovascular:     Rate and Rhythm: Normal rate and regular rhythm.     Heart sounds: Normal heart sounds. No murmur heard. Pulmonary:     Effort: Pulmonary effort is normal. No respiratory distress.     Breath sounds: Normal breath sounds. No wheezing.  Abdominal:     General: Bowel sounds are normal. There is no distension.     Palpations: Abdomen is soft. There is no mass.     Tenderness: There is no abdominal tenderness. There is no guarding.  Musculoskeletal:        General: Normal range of motion.     Cervical back: Normal range of motion and neck supple.  Lymphadenopathy:     Cervical: No cervical adenopathy.  Skin:    General: Skin is warm and dry.     Capillary Refill: Capillary refill takes less than 2 seconds.  Neurological:     Mental Status: He is alert and oriented to person, place, and time.       Results:  PHQ-9:  [place] Visit from 10/24/2018 in [facility]  PHQ-9 Total Score 2         Assessment: 55 y.o. here for routine annual physical examination.  Plan: Problem List Items Addressed This Visit       Cardiovascular and Mediastinum   Essential hypertension - Primary    BP controlled. Cont medication      Relevant Orders   Comprehensive metabolic panel   CBC     Endocrine   Type 2 diabetes mellitus with other specified complication ([place])    Controlled on metformin as of last check. Repeat today      Relevant Orders   Hemoglobin A1c     Other   HLD (hyperlipidemia)   Relevant Orders   Lipid panel   Other Visit Diagnoses     Annual physical exam       Prostate cancer  screening       Relevant Orders   PSA   History of tobacco use       Relevant Orders   Ambulatory Referral for Lung Cancer Scre       Screening: -- Blood pressure screen normal -- cholesterol screening: will obtain -- Weight screening: obese: discussed management options, including lifestyle, dietary, and exercise. -- Diabetes Screening: will obtain -- Nutrition: normal - Encouraged healthy diet  The 10-year ASCVD risk score (Arnett DK, et al., 2019) is: 12.8%   Values used to calculate the score:     Age: 66 years     Sex: Male     Is Non-Hispanic African American: No     Diabetic: Yes     Tobacco smoker: No     Systolic Blood Pressure: 497 mmHg     Is BP treated: Yes     HDL Cholesterol: 29.4 mg/dL     Total Cholesterol: 137 mg/dL  --  ASA 81 mg discussed if CVD risk >10% age 47-59 and willing to take for 10 years -- Statin therapy for Age 53-75 with CVD risk >7.5%  Psych -- Depression screening (PHQ-9): negative  Safety -- tobacco screening: not using -- alcohol screening:  low-risk usage. -- no evidence of domestic violence or intimate partner violence.  Cancer Screening -- Prostate (age 3-69) ordered -- Colon (age 36-75)  up to date -- Lung ordered   Immunizations Immunization History  Administered Date(s) Administered   [employer] Vaccination 03/24/2019, 04/25/2019, 01/01/2020   Pneumococcal Polysaccharide-23 10/14/2015   Td 07/05/2002   Tdap 10/18/2017    -- flu vaccine not up to date - declined -- TDAP q10 years up to date -- Shingles (age >34) not up to date - he will get at pharmacy -- PPSV-23 (19-64 with chronic disease or smoking) up to date -- Covid-19 Vaccine not up to date - encouraged booster  Encouraged regular vision and dental screening. Encouraged healthy exercise and diet.   Lesleigh Noe

## 2020-10-31 NOTE — Patient Instructions (Addendum)
Return for Shingles - or get a pharmacy - and update [place]  Work on getting back on track with diet and exercise

## 2020-10-31 NOTE — Assessment & Plan Note (Signed)
BP controlled. Cont medication

## 2020-10-31 NOTE — Assessment & Plan Note (Signed)
Controlled on metformin as of last check. Repeat today

## 2020-11-08 ENCOUNTER — Encounter: Payer: Self-pay | Admitting: Family Medicine

## 2020-11-13 ENCOUNTER — Telehealth: Payer: Self-pay | Admitting: Family Medicine

## 2020-11-13 DIAGNOSIS — E781 Pure hyperglyceridemia: Secondary | ICD-10-CM

## 2020-11-13 DIAGNOSIS — E782 Mixed hyperlipidemia: Secondary | ICD-10-CM

## 2020-11-15 NOTE — Telephone Encounter (Signed)
Called patient and got him scheduled for 2/9 @2 

## 2020-12-01 DIAGNOSIS — G4733 Obstructive sleep apnea (adult) (pediatric): Secondary | ICD-10-CM | POA: Diagnosis not present

## 2020-12-17 ENCOUNTER — Other Ambulatory Visit: Payer: Self-pay

## 2020-12-17 ENCOUNTER — Ambulatory Visit (INDEPENDENT_AMBULATORY_CARE_PROVIDER_SITE_OTHER): Payer: BC Managed Care – PPO | Admitting: Dermatology

## 2020-12-17 DIAGNOSIS — L820 Inflamed seborrheic keratosis: Secondary | ICD-10-CM

## 2020-12-17 DIAGNOSIS — L814 Other melanin hyperpigmentation: Secondary | ICD-10-CM

## 2020-12-17 DIAGNOSIS — L578 Other skin changes due to chronic exposure to nonionizing radiation: Secondary | ICD-10-CM

## 2020-12-17 DIAGNOSIS — D235 Other benign neoplasm of skin of trunk: Secondary | ICD-10-CM

## 2020-12-17 DIAGNOSIS — Z1283 Encounter for screening for malignant neoplasm of skin: Secondary | ICD-10-CM | POA: Diagnosis not present

## 2020-12-17 DIAGNOSIS — Z86018 Personal history of other benign neoplasm: Secondary | ICD-10-CM

## 2020-12-17 DIAGNOSIS — L821 Other seborrheic keratosis: Secondary | ICD-10-CM

## 2020-12-17 DIAGNOSIS — D229 Melanocytic nevi, unspecified: Secondary | ICD-10-CM

## 2020-12-17 DIAGNOSIS — D1800 Hemangioma unspecified site: Secondary | ICD-10-CM

## 2020-12-17 DIAGNOSIS — L570 Actinic keratosis: Secondary | ICD-10-CM

## 2020-12-17 DIAGNOSIS — Z85828 Personal history of other malignant neoplasm of skin: Secondary | ICD-10-CM

## 2020-12-17 DIAGNOSIS — Z86006 Personal history of melanoma in-situ: Secondary | ICD-10-CM

## 2020-12-17 NOTE — Patient Instructions (Signed)
Seborrheic Keratosis  What causes seborrheic keratoses? Seborrheic keratoses are harmless, common skin growths that first appear during adult life.  As time goes by, more growths appear.  Some people may develop a large number of them.  Seborrheic keratoses appear on both covered and uncovered body parts.  They are not caused by sunlight.  The tendency to develop seborrheic keratoses can be inherited.  They vary in color from skin-colored to gray, brown, or even black.  They can be either smooth or have a rough, warty surface.   Seborrheic keratoses are superficial and look as if they were stuck on the skin.  Under the microscope this type of keratosis looks like layers upon layers of skin.  That is why at times the top layer may seem to fall off, but the rest of the growth remains and re-grows.    Treatment Seborrheic keratoses do not need to be treated, but can easily be removed in the office.  Seborrheic keratoses often cause symptoms when they rub on clothing or jewelry.  Lesions can be in the way of shaving.  If they become inflamed, they can cause itching, soreness, or burning.  Removal of a seborrheic keratosis can be accomplished by freezing, burning, or surgery. If any spot bleeds, scabs, or grows rapidly, please return to have it checked, as these can be an indication of a skin cancer.  Cryotherapy Aftercare  Wash gently with soap and water everyday.   Apply Vaseline and Band-Aid daily until healed.   Melanoma ABCDEs  Melanoma is the most dangerous type of skin cancer, and is the leading cause of death from skin disease.  You are more likely to develop melanoma if you: Have light-colored skin, light-colored eyes, or red or blond hair Spend a lot of time in the sun Tan regularly, either outdoors or in a tanning bed Have had blistering sunburns, especially during childhood Have a close family member who has had a melanoma Have atypical moles or large birthmarks  Early detection of  melanoma is key since treatment is typically straightforward and cure rates are extremely high if we catch it early.   The first sign of melanoma is often a change in a mole or a new dark spot.  The ABCDE system is a way of remembering the signs of melanoma.  A for asymmetry:  The two halves do not match. B for border:  The edges of the growth are irregular. C for color:  A mixture of colors are present instead of an even brown color. D for diameter:  Melanomas are usually (but not always) greater than 65mm - the size of a pencil eraser. E for evolution:  The spot keeps changing in size, shape, and color.  Please check your skin once per month between visits. You can use a small mirror in front and a large mirror behind you to keep an eye on the back side or your body.   If you see any new or changing lesions before your next follow-up, please call to schedule a visit.  Please continue daily skin protection including broad spectrum sunscreen SPF 30+ to sun-exposed areas, reapplying every 2 hours as needed when you're outdoors.    If You Need Anything After Your Visit  If you have any questions or concerns for your doctor, please call our main line at [phone] and press option 4 to reach your doctor's medical assistant. If no one answers, please leave a voicemail as directed and we will return your  call as soon as possible. Messages left after 4 pm will be answered the following business day.   You may also send [place] a message via [place]. We typically respond to MyChart messages within 1-2 business days.  For prescription refills, please ask your pharmacy to contact our office. Our fax number is [phone].  If you have an urgent issue when the clinic is closed that cannot wait until the next business day, you can page your doctor at the number below.    Please note that while we do our best to be available for urgent issues outside of office hours, we are not available 24/7.   If you  have an urgent issue and are unable to reach [place], you may choose to seek medical care at your doctor's office, retail clinic, urgent care center, or emergency room.  If you have a medical emergency, please immediately call 911 or go to the emergency department.  Pager Numbers  - Dr. Nehemiah Massed: [phone]  - Dr. Laurence Ferrari: [phone]  - Dr. Nicole Kindred: [phone]  In the event of inclement weather, please call our main line at [phone] for an update on the status of any delays or closures.  Dermatology Medication Tips: Please keep the boxes that topical medications come in in order to help keep track of the instructions about where and how to use these. Pharmacies typically print the medication instructions only on the boxes and not directly on the medication tubes.   If your medication is too expensive, please contact our office at [phone] option 4 or send [place] a message through [place].   We are unable to tell what your co-pay for medications will be in advance as this is different depending on your insurance coverage. However, we may be able to find a substitute medication at lower cost or fill out paperwork to get insurance to cover a needed medication.   If a prior authorization is required to get your medication covered by your insurance company, please allow [place] 1-2 business days to complete this process.  Drug prices often vary depending on where the prescription is filled and some pharmacies may offer cheaper prices.  The website www.goodrx.com contains coupons for medications through different pharmacies. The prices here do not account for what the cost may be with help from insurance (it may be cheaper with your insurance), but the website can give you the price if you did not use any insurance.  - You can print the associated coupon and take it with your prescription to the pharmacy.  - You may also stop by our office during regular business hours and pick up a GoodRx coupon  card.  - If you need your prescription sent electronically to a different pharmacy, notify our office through [facility] or by phone at [phone] option 4.     Si Usted Necesita Algo Despus de Su Visita  Tambin puede enviarnos un mensaje a travs de Pharmacist, community. Por lo general respondemos a los mensajes de MyChart en el transcurso de 1 a 2 das hbiles.  Para renovar recetas, por favor pida a su farmacia que se ponga en contacto con nuestra oficina. Harland Dingwall de fax es [facility] [phone].  Si tiene un asunto urgente cuando la clnica est cerrada y que no puede esperar hasta el siguiente da hbil, puede llamar/localizar a su doctor(a) al nmero que aparece a continuacin.   Por favor, tenga en cuenta que aunque hacemos todo lo posible para estar disponibles para asuntos urgentes fuera  del horario de oficina, no estamos disponibles las 24 horas del da, los 7 das de la [place].   Si tiene un problema urgente y no puede comunicarse con nosotros, puede optar por buscar atencin mdica  en el consultorio de su doctor(a), en una clnica privada, en un centro de atencin urgente o en una sala de emergencias.  Si tiene Engineering geologist, por favor llame inmediatamente al 911 o vaya a la sala de emergencias.  Nmeros de bper  - Dr. Nehemiah Massed: [phone]  - Dra. Moye: [phone]  - Dra. Nicole Kindred: [phone]  En caso de inclemencias del [place], por favor llame a Johnsie Kindred principal al [phone] para una actualizacin sobre el [place] de cualquier retraso o cierre.  Consejos para la medicacin en dermatologa: Por favor, guarde las cajas en las que vienen los medicamentos de uso tpico para ayudarle a seguir las instrucciones sobre dnde y cmo usarlos. Las farmacias generalmente imprimen las instrucciones del medicamento slo en las cajas y no directamente en los tubos del [place].   Si su medicamento es muy caro, por favor, pngase en contacto con Zigmund Nashaun llamando al [phone] y presione la opcin 4 o envenos un mensaje a travs de Pharmacist, community.   No podemos decirle cul ser su copago por los medicamentos por adelantado ya que esto es diferente dependiendo de la cobertura de su seguro. Sin embargo, es posible que podamos encontrar un medicamento sustituto a Electrical engineer un formulario para que el seguro cubra el medicamento que se considera necesario.   Si se requiere una autorizacin previa para que su compaa de seguros [place] su medicamento, por favor permtanos de 1 a 2 das hbiles para completar este proceso.  Los precios de los medicamentos varan con frecuencia dependiendo del Environmental consultant de dnde se surte la receta y alguna farmacias pueden ofrecer precios ms baratos.  El sitio web www.goodrx.com tiene cupones para medicamentos de Airline pilot. Los precios aqu no tienen en cuenta lo que podra costar con la ayuda del seguro (puede ser ms barato con su seguro), pero el sitio web puede darle el precio si no utiliz Research scientist (physical sciences).  - Puede imprimir el cupn correspondiente y llevarlo con su receta a la farmacia.  - Tambin puede pasar por nuestra oficina durante el horario de atencin regular y Charity fundraiser una tarjeta de cupones de GoodRx.  - Si necesita que su receta se enve electrnicamente a una farmacia diferente, informe a nuestra oficina a travs de MyChart de [place] o por telfono llamando al [phone] y presione la opcin 4.

## 2020-12-17 NOTE — Progress Notes (Signed)
Follow-Up Visit   Subjective  Larry Edwards is a 55 y.o. male who presents for the following: Follow-up (Patient here today for 1 year tbse. Patient has a spot above left eye patient reports that it has become inflammed and irritated. Patient reports dark spot below right knee and some areas behind right knee he would like checked. ). He also has a few other growths on face and neck that are irritated.  He has a h/o melanoma in situ, h/o severe dysplastic nevus, and h/o BCC.  Patient here for full body skin exam and skin cancer screening. The patient has spots, moles and lesions to be evaluated, some may be new or changing and the patient has concerns that these could be cancer.    The following portions of the chart were reviewed this encounter and updated as appropriate:      Review of Systems: No other skin or systemic complaints except as noted in HPI or Assessment and Plan.   Objective  Well appearing patient in no apparent distress; mood and affect are within normal limits.  A full examination was performed including scalp, head, eyes, ears, nose, lips, neck, chest, axillae, abdomen, back, buttocks, bilateral upper extremities, bilateral lower extremities, hands, feet, fingers, toes, fingernails, and toenails. All findings within normal limits unless otherwise noted below.  left temple x 1, left upper neck x 1, left cheek x 1, left upper forehead x 1 (4) Erythematous keratotic or waxy stuck-on papule   Right Zygomatic Area x 1 Pink scaly macule  Assessment & Plan  Inflamed seborrheic keratosis left temple x 1, left upper neck x 1, left cheek x 1, left upper forehead x 1  Destruction of lesion - left temple x 1, left upper neck x 1, left cheek x 1, left upper forehead x 1  Destruction method: cryotherapy   Informed consent: discussed and consent obtained   Lesion destroyed using liquid nitrogen: Yes   Region frozen until ice ball extended beyond lesion: Yes   Outcome:  patient tolerated procedure well with no complications   Post-procedure details: wound care instructions given   Additional details:  Prior to procedure, discussed risks of blister formation, small wound, skin dyspigmentation, or rare scar following cryotherapy. Recommend Vaseline ointment to treated areas while healing.   Actinic keratosis Right Zygomatic Area x 1  Actinic keratoses are precancerous spots that appear secondary to cumulative UV radiation exposure/sun exposure over time. They are chronic with expected duration over 1 year. A portion of actinic keratoses will progress to squamous cell carcinoma of the skin. It is not possible to reliably predict which spots will progress to skin cancer and so treatment is recommended to prevent development of skin cancer.  Recommend daily broad spectrum sunscreen SPF 30+ to sun-exposed areas, reapply every 2 hours as needed.  Recommend staying in the shade or wearing long sleeves, sun glasses (UVA+UVB protection) and wide brim hats (4-inch brim around the entire circumference of the hat). Call for new or changing lesions.  Destruction of lesion - Right Zygomatic Area x 1  Destruction method: cryotherapy   Informed consent: discussed and consent obtained   Lesion destroyed using liquid nitrogen: Yes   Region frozen until ice ball extended beyond lesion: Yes   Outcome: patient tolerated procedure well with no complications   Post-procedure details: wound care instructions given   Additional details:  Prior to procedure, discussed risks of blister formation, small wound, skin dyspigmentation, or rare scar following cryotherapy. Recommend Vaseline  ointment to treated areas while healing.  Lentigines - Scattered tan macules - Due to sun exposure - Benign-appearing, observe - Recommend daily broad spectrum sunscreen SPF 30+ to sun-exposed areas, reapply every 2 hours as needed. - Call for any changes  Seborrheic Keratoses - Stuck-on, waxy,  tan-brown papules and/or plaques at right knee and right lower leg, trunk, neck, face - Benign-appearing - Discussed benign etiology and prognosis. - Observe - Call for any changes  Melanocytic Nevi - Tan-brown and/or pink-flesh-colored symmetric macules and papules - Benign appearing on exam today - Observation - Call clinic for new or changing moles - Recommend daily use of broad spectrum spf 30+ sunscreen to sun-exposed areas.   Dermatofibroma - Firm pink/brown papulenodule with dimple sign right spinal upper back - Benign appearing - Call for any changes  Hemangiomas - Red papules - Discussed benign nature - Observe - Call for any changes  Actinic Damage - Chronic condition, secondary to cumulative UV/sun exposure - diffuse scaly erythematous macules with underlying dyspigmentation - Recommend daily broad spectrum sunscreen SPF 30+ to sun-exposed areas, reapply every 2 hours as needed.  - Staying in the shade or wearing long sleeves, sun glasses (UVA+UVB protection) and wide brim hats (4-inch brim around the entire circumference of the hat) are also recommended for sun protection.  - Call for new or changing lesions.  History of Melanoma in Situ 2011 - No evidence of recurrence today upper back excised  - Recommend regular full body skin exams - Recommend daily broad spectrum sunscreen SPF 30+ to sun-exposed areas, reapply every 2 hours as needed.  - Call if any new or changing lesions are noted between office visits  History of Basal Cell Carcinoma of the Skin 2020 - No evidence of recurrence today at left spinal upper back, right shoulder superficial  - Recommend regular full body skin exams - Recommend daily broad spectrum sunscreen SPF 30+ to sun-exposed areas, reapply every 2 hours as needed.  - Call if any new or changing lesions are noted between office visits  History of Dysplastic Nevi, 2013, 2021 - No evidence of recurrence today R shoulder, right abdomen  both severe and excised  - Recommend regular full body skin exams - Recommend daily broad spectrum sunscreen SPF 30+ to sun-exposed areas, reapply every 2 hours as needed.  - Call if any new or changing lesions are noted between office visits  Skin cancer screening performed today.  Return for April tbse, pt will be moving in May to [place]. I, Ruthell Rummage, CMA, am acting as scribe for [name], MD.  Documentation: I have reviewed the above documentation for accuracy and completeness, and I agree with the above.  [name] MD

## 2020-12-27 ENCOUNTER — Other Ambulatory Visit: Payer: Self-pay | Admitting: *Deleted

## 2020-12-27 DIAGNOSIS — Z87891 Personal history of nicotine dependence: Secondary | ICD-10-CM

## 2020-12-31 DIAGNOSIS — G4733 Obstructive sleep apnea (adult) (pediatric): Secondary | ICD-10-CM | POA: Diagnosis not present

## 2021-01-15 ENCOUNTER — Telehealth: Payer: BC Managed Care – PPO | Admitting: Primary Care

## 2021-01-16 ENCOUNTER — Ambulatory Visit: Payer: BC Managed Care – PPO

## 2021-02-16 ENCOUNTER — Other Ambulatory Visit: Payer: Self-pay | Admitting: Family Medicine

## 2021-02-20 ENCOUNTER — Ambulatory Visit: Payer: BC Managed Care – PPO | Admitting: Family Medicine

## 2021-02-24 ENCOUNTER — Encounter: Payer: Self-pay | Admitting: Family Medicine

## 2021-02-24 ENCOUNTER — Other Ambulatory Visit: Payer: Self-pay

## 2021-02-24 ENCOUNTER — Ambulatory Visit (INDEPENDENT_AMBULATORY_CARE_PROVIDER_SITE_OTHER): Payer: BC Managed Care – PPO | Admitting: Family Medicine

## 2021-02-24 VITALS — BP 120/68 | HR 61 | Temp 98.2°F | Ht 69.0 in | Wt 290.5 lb

## 2021-02-24 DIAGNOSIS — E785 Hyperlipidemia, unspecified: Secondary | ICD-10-CM

## 2021-02-24 DIAGNOSIS — I10 Essential (primary) hypertension: Secondary | ICD-10-CM

## 2021-02-24 DIAGNOSIS — E1169 Type 2 diabetes mellitus with other specified complication: Secondary | ICD-10-CM | POA: Diagnosis not present

## 2021-02-24 LAB — POCT GLYCOSYLATED HEMOGLOBIN (HGB A1C): Hemoglobin A1C: 6.8 % — AB (ref 4.0–5.6)

## 2021-02-24 MED ORDER — METFORMIN HCL 1000 MG PO TABS: 1000.0000 mg | ORAL_TABLET | Freq: Two times a day (BID) | ORAL | 0 refills | Status: DC

## 2021-02-24 NOTE — Patient Instructions (Addendum)
If needed with move can provide 60 days of medication  Would recommend calling for new primary now  Book: Glucose revolution -- Lurena Nida

## 2021-02-24 NOTE — Assessment & Plan Note (Signed)
At goal. Cont crestor 10 mg

## 2021-02-24 NOTE — Assessment & Plan Note (Signed)
Controlled. Cont lisinopril 40 mg and atenolol 50 mg

## 2021-02-24 NOTE — Progress Notes (Signed)
Subjective:     Larry Edwards is a 56 y.o. male presenting for Follow-up (HT & DM )     HPI  #Diabetes Currently taking metformin (Glucophage, Riomet)  Using medications without difficulties: No Hypoglycemic episodes:No  Hyperglycemic episodes:No  Feet problems:No  Blood Sugars averaging: does not check Last HgbA1c:  Lab Results  Component Value Date   HGBA1C 6.8 (A) 02/24/2021    Diabetes Health Maintenance Due:    Diabetes Health Maintenance Due  Topic Date Due   HEMOGLOBIN A1C  08/24/2021   OPHTHALMOLOGY EXAM  10/24/2021   FOOT EXAM  Discontinued   Improved with diet/exercise     Review of Systems   Social History   Tobacco Use  Smoking Status Former   Packs/day: 2.00   Years: 20.00   Pack years: 40.00   Types: Cigarettes   Quit date: 02/25/2011   Years since quitting: 10.0  Smokeless Tobacco Former        Objective:    BP Readings from Last 3 Encounters:  02/24/21 120/68  10/31/20 136/76  04/05/20 128/70   Wt Readings from Last 3 Encounters:  02/24/21 290 lb 8 oz (131.8 kg)  10/31/20 294 lb (133.4 kg)  04/05/20 291 lb 6.4 oz (132.2 kg)    BP 120/68    Pulse 61    Temp 98.2 F (36.8 C) (Oral)    Ht 5\' 9"  (1.753 m)    Wt 290 lb 8 oz (131.8 kg)    SpO2 97%    BMI 42.90 kg/m    Physical Exam Constitutional:      Appearance: Normal appearance. He is not ill-appearing or diaphoretic.  HENT:     Right Ear: External ear normal.     Left Ear: External ear normal.  Eyes:     General: No scleral icterus.    Extraocular Movements: Extraocular movements intact.     Conjunctiva/sclera: Conjunctivae normal.  Cardiovascular:     Rate and Rhythm: Normal rate and regular rhythm.     Heart sounds: No murmur heard. Pulmonary:     Effort: Pulmonary effort is normal. No respiratory distress.     Breath sounds: Normal breath sounds. No wheezing.  Musculoskeletal:     Cervical back: Neck supple.  Skin:    General: Skin is warm and dry.   Neurological:     Mental Status: He is alert. Mental status is at baseline.  Psychiatric:        Mood and Affect: Mood normal.          Assessment & Plan:   Problem List Items Addressed This Visit       Cardiovascular and Mediastinum   Essential hypertension    Controlled. Cont lisinopril 40 mg and atenolol 50 mg        Endocrine   Type 2 diabetes mellitus with other specified complication ([place]) - Primary    Lab Results  Component Value Date   HGBA1C 6.8 (A) 02/24/2021  Controlled. Cont metformin 1000 mg bid. Cont diet/exercise. Cont rosuvastatin 10 mg and lisinopril.       Relevant Medications   metFORMIN (GLUCOPHAGE) 1000 MG tablet   Other Relevant Orders   POCT glycosylated hemoglobin (Hb A1C) (Completed)     Other   HLD (hyperlipidemia)    At goal. Cont crestor 10 mg        Return in about 8 months (around 10/24/2021) for annual.  Lesleigh Noe, MD  This visit occurred during the SARS-CoV-2 public  health emergency.  Safety protocols were in place, including screening questions prior to the visit, additional usage of staff PPE, and extensive cleaning of exam room while observing appropriate contact time as indicated for disinfecting solutions.

## 2021-02-24 NOTE — Assessment & Plan Note (Signed)
Lab Results  Component Value Date   HGBA1C 6.8 (A) 02/24/2021   Controlled. Cont metformin 1000 mg bid. Cont diet/exercise. Cont rosuvastatin 10 mg and lisinopril.

## 2021-02-25 ENCOUNTER — Ambulatory Visit: Payer: BC Managed Care – PPO | Admitting: Family Medicine

## 2021-03-27 ENCOUNTER — Encounter: Payer: Self-pay | Admitting: Dermatology

## 2021-04-28 ENCOUNTER — Ambulatory Visit (INDEPENDENT_AMBULATORY_CARE_PROVIDER_SITE_OTHER): Payer: BC Managed Care – PPO | Admitting: Dermatology

## 2021-04-28 DIAGNOSIS — D1800 Hemangioma unspecified site: Secondary | ICD-10-CM

## 2021-04-28 DIAGNOSIS — Z1283 Encounter for screening for malignant neoplasm of skin: Secondary | ICD-10-CM | POA: Diagnosis not present

## 2021-04-28 DIAGNOSIS — D229 Melanocytic nevi, unspecified: Secondary | ICD-10-CM | POA: Diagnosis not present

## 2021-04-28 DIAGNOSIS — Z85828 Personal history of other malignant neoplasm of skin: Secondary | ICD-10-CM

## 2021-04-28 DIAGNOSIS — L578 Other skin changes due to chronic exposure to nonionizing radiation: Secondary | ICD-10-CM

## 2021-04-28 DIAGNOSIS — L814 Other melanin hyperpigmentation: Secondary | ICD-10-CM | POA: Diagnosis not present

## 2021-04-28 DIAGNOSIS — Z86006 Personal history of melanoma in-situ: Secondary | ICD-10-CM

## 2021-04-28 DIAGNOSIS — L821 Other seborrheic keratosis: Secondary | ICD-10-CM | POA: Diagnosis not present

## 2021-04-28 DIAGNOSIS — Z86018 Personal history of other benign neoplasm: Secondary | ICD-10-CM

## 2021-04-28 DIAGNOSIS — L820 Inflamed seborrheic keratosis: Secondary | ICD-10-CM | POA: Diagnosis not present

## 2021-04-28 NOTE — Patient Instructions (Addendum)

## 2021-04-28 NOTE — Progress Notes (Signed)
? ?Follow-Up Visit ?  ?Subjective  ?Larry Edwards is a 56 y.o. male who presents for the following: Annual Exam (Mole check ). Pt here for mole check, he will be moving to [place] in 2 weeks, this will be his last visit here. ?The patient presents for Total-Body Skin Exam (TBSE) for skin cancer screening and mole check.  The patient has spots, moles and lesions to be evaluated, some may be new or changing and the patient has concerns that these could be cancer. He has a few spots that get irritated. ? ? ?The following portions of the chart were reviewed this encounter and updated as appropriate:  ?  ?  ? ?Review of Systems:  No other skin or systemic complaints except as noted in HPI or Assessment and Plan. ? ?Objective  ?Well appearing patient in no apparent distress; mood and affect are within normal limits. ? ?All skin waist up examined. ? ?left postauricular neck x 1, left upper arm x 1, left forearm below elbow x 1  (3) (3) ?Stuck-on, waxy, tan-brown papules with erythema ? ? ? ?Assessment & Plan  ?Inflamed seborrheic keratosis (3) ?left postauricular neck x 1, left upper arm x 1, left forearm below elbow x 1  (3) ? ?  ? ?Destruction of lesion - left postauricular neck x 1, left upper arm x 1, left forearm below elbow x 1  (3) ? ?Destruction method: cryotherapy   ?Informed consent: discussed and consent obtained   ?Timeout:  patient name, date of birth, surgical site, and procedure verified ?Lesion destroyed using liquid nitrogen: Yes   ?Region frozen until ice ball extended beyond lesion: Yes   ?Outcome: patient tolerated procedure well with no complications   ?Post-procedure details: wound care instructions given   ? ? ?Lentigines ?- Scattered tan macules ?- Due to sun exposure ?- Benign-appearing, observe ?- Recommend daily broad spectrum sunscreen SPF 30+ to sun-exposed areas, reapply every 2 hours as needed. ?- Call for any changes ? ?Seborrheic Keratoses ?- Stuck-on, waxy, tan-brown papules and/or  plaques  ?- Benign-appearing ?- Discussed benign etiology and prognosis. ?- Observe ?- Call for any changes ? ?Melanocytic Nevi ?- Tan-brown and/or pink-flesh-colored symmetric macules and papules ?- Benign appearing on exam today ?- Observation ?- Call clinic for new or changing moles ?- Recommend daily use of broad spectrum spf 30+ sunscreen to sun-exposed areas.  ? ?Hemangiomas ?- Red papules ?- Discussed benign nature ?- Observe ?- Call for any changes ? ?Actinic Damage ?- Chronic condition, secondary to cumulative UV/sun exposure ?- diffuse scaly erythematous macules with underlying dyspigmentation ?- Recommend daily broad spectrum sunscreen SPF 30+ to sun-exposed areas, reapply every 2 hours as needed.  ?- Staying in the shade or wearing long sleeves, sun glasses (UVA+UVB protection) and wide brim hats (4-inch brim around the entire circumference of the hat) are also recommended for sun protection.  ?- Call for new or changing lesions. ? ?History of Dysplastic Nevi ?Right shoulder 12/02/2001, right abdomen 02/05/2020  ?s/p excision ?- No evidence of recurrence today ?- Recommend regular full body skin exams ?- Recommend daily broad spectrum sunscreen SPF 30+ to sun-exposed areas, reapply every 2 hours as needed.  ?- Call if any new or changing lesions are noted between office visits  ?Copy of pathology result given to patient ? ?History of Basal Cell Carcinoma of the Skin ?Left spinal upper back, right shoulder 11/29/2018 s/p EDC ?- No evidence of recurrence today ?- Recommend regular full body skin exams ?-  Recommend daily broad spectrum sunscreen SPF 30+ to sun-exposed areas, reapply every 2 hours as needed.  ?- Call if any new or changing lesions are noted between office visits  ?Copy of pathology result given to patient ? ?History of Melanoma in Situ ?Upper back 02/07/2009 s/p WLE ?- No evidence of recurrence today ?- Recommend regular full body skin exams ?- Recommend daily broad spectrum sunscreen SPF 30+  to sun-exposed areas, reapply every 2 hours as needed.  ?- Call if any new or changing lesions are noted between office visits  ?Copy of pathology result given to patient ? ?Skin cancer screening performed today.  ? ?Return if symptoms worsen or fail to improve, [place] out of [place] in 2 weeks, for advised pt to schedule regular skin checks with dermatologist in new location. ? ?I, Larry Edwards, CMA, am acting as scribe for Larry Patty, MD .  ? ?Documentation: I have reviewed the above documentation for accuracy and completeness, and I agree with the above. ? ?Larry Patty MD  ?

## 2021-04-29 ENCOUNTER — Telehealth: Payer: Self-pay | Admitting: Pulmonary Disease

## 2021-04-29 ENCOUNTER — Encounter: Payer: Self-pay | Admitting: Family Medicine

## 2021-04-29 DIAGNOSIS — G4733 Obstructive sleep apnea (adult) (pediatric): Secondary | ICD-10-CM

## 2021-04-29 NOTE — Telephone Encounter (Signed)
Patient is aware that referral  was need to be placed by PCP, as he was last seen over an year ago. Patient has been provided with medical records contact number per request.  ?Nothing further needed at this time.  ? ?

## 2021-05-27 NOTE — Telephone Encounter (Signed)
Disregard this was handled 05/13/2021 ? ?I could not find the note where this was handled until after I had already sent this a few minutes ago.  ? ?This referral is closed.  ? ?Nothing further needed.  ? ?

## 2021-05-27 NOTE — Telephone Encounter (Signed)
Unable to find or create Provider in Epic ?Unable to fax records via ROI ? ?Please print referral and fax to requested location. ? ?Zenovia Jordan MD ?[place], [place] ?[address], [address] ?[place] [address] ? ?Fax:[phone] ?(Verified with office) ? ?

## 2021-05-28 DIAGNOSIS — G4733 Obstructive sleep apnea (adult) (pediatric): Secondary | ICD-10-CM | POA: Diagnosis not present

## 2021-06-05 ENCOUNTER — Other Ambulatory Visit: Payer: Self-pay | Admitting: Family Medicine

## 2021-06-05 MED ORDER — METFORMIN HCL 1000 MG PO TABS: 1000.0000 mg | ORAL_TABLET | Freq: Two times a day (BID) | ORAL | 0 refills | Status: AC

## 2021-09-29 NOTE — Progress Notes (Signed)
VM left with pre-colonoscopy instructions:     Date/time/location of surgery/procedure  10/02/2021 @ 1100     NPO after MN status     Need for driver      Need to complete bowel prep/instructions per Dr. Francine Graven     Instructed pt to take BP meds with a small sip of water prior to procedure/surgery.    PAT phone number [phone] provided for pt to call with any further questions prior to procedure.

## 2021-10-02 ENCOUNTER — Inpatient Hospital Stay: Payer: BLUE CROSS/BLUE SHIELD | Attending: Surgery

## 2021-10-02 MED ORDER — PROPOFOL 500 MG/50ML IV EMUL
500 MG/50ML | INTRAVENOUS | Status: AC
Start: 2021-10-02 — End: 2021-10-02

## 2021-10-02 MED ORDER — OXYCODONE-ACETAMINOPHEN 5-325 MG PO TABS
5-325 MG | Freq: Once | ORAL | Status: DC | PRN
Start: 2021-10-02 — End: 2021-10-02

## 2021-10-02 MED ORDER — LABETALOL HCL 5 MG/ML IV SOLN
5 MG/ML | INTRAVENOUS | Status: DC | PRN
Start: 2021-10-02 — End: 2021-10-02

## 2021-10-02 MED ORDER — SODIUM CHLORIDE (PF) 0.9 % IJ SOLN
0.9 % | INTRAMUSCULAR | Status: DC | PRN
Start: 2021-10-02 — End: 2021-10-02
  Administered 2021-10-02: 17:00:00 1 via INTRAVENOUS

## 2021-10-02 MED ORDER — PHENYLEPHRINE HCL 10 MG/ML SOLN (MIXTURES ONLY)
10 MG/ML | Status: AC
Start: 2021-10-02 — End: ?

## 2021-10-02 MED ORDER — PROPOFOL 200 MG/20ML IV EMUL
200 MG/20ML | INTRAVENOUS | Status: AC
Start: 2021-10-02 — End: ?

## 2021-10-02 MED ORDER — PROPOFOL 200 MG/20ML IV EMUL
200 MG/20ML | INTRAVENOUS | Status: DC | PRN
Start: 2021-10-02 — End: 2021-10-02
  Administered 2021-10-02 (×3): 50 via INTRAVENOUS

## 2021-10-02 MED ORDER — LACTATED RINGERS IV SOLN
INTRAVENOUS | Status: DC
Start: 2021-10-02 — End: 2021-10-02
  Administered 2021-10-02: 17:00:00 via INTRAVENOUS

## 2021-10-02 MED ORDER — PROPOFOL 500 MG/50ML IV EMUL
500 MG/50ML | INTRAVENOUS | Status: DC | PRN
Start: 2021-10-02 — End: 2021-10-02
  Administered 2021-10-02: 17:00:00 150 via INTRAVENOUS

## 2021-10-02 MED ORDER — NORMAL SALINE FLUSH 0.9 % IV SOLN
0.9 % | Freq: Two times a day (BID) | INTRAVENOUS | Status: DC
Start: 2021-10-02 — End: 2021-10-02

## 2021-10-02 MED ORDER — SODIUM CHLORIDE 0.9 % IV SOLN
0.9 % | INTRAVENOUS | Status: DC | PRN
Start: 2021-10-02 — End: 2021-10-02

## 2021-10-02 MED ORDER — HYDROMORPHONE HCL 1 MG/ML IJ SOLN
1 MG/ML | INTRAMUSCULAR | Status: DC | PRN
Start: 2021-10-02 — End: 2021-10-02

## 2021-10-02 MED ORDER — NORMAL SALINE FLUSH 0.9 % IV SOLN
0.9 % | INTRAVENOUS | Status: DC | PRN
Start: 2021-10-02 — End: 2021-10-02

## 2021-10-02 MED ORDER — GLYCOPYRROLATE 1 MG/5ML IJ SOLN
1 MG/5ML | Freq: Once | INTRAMUSCULAR | Status: DC
Start: 2021-10-02 — End: 2021-10-02

## 2021-10-02 MED ORDER — LIDOCAINE HCL 1 % IJ SOLN
1 % | INTRAMUSCULAR | Status: AC
Start: 2021-10-02 — End: ?

## 2021-10-02 MED ORDER — PROMETHAZINE HCL 25 MG/ML IJ SOLN
25 | INTRAMUSCULAR | Status: DC | PRN
Start: 2021-10-02 — End: 2021-10-02

## 2021-10-02 MED ORDER — PHENYLEPHRINE HCL 10 MG/ML SOLN (MIXTURES ONLY)
10 MG/ML | Status: DC | PRN
Start: 2021-10-02 — End: 2021-10-02
  Administered 2021-10-02: 17:00:00 100 via INTRAVENOUS

## 2021-10-02 MED ORDER — ONDANSETRON HCL 4 MG/2ML IJ SOLN
4 MG/2ML | Freq: Once | INTRAMUSCULAR | Status: DC | PRN
Start: 2021-10-02 — End: 2021-10-02

## 2021-10-02 MED ORDER — MORPHINE SULFATE (PF) 2 MG/ML IV SOLN
2 MG/ML | INTRAVENOUS | Status: DC | PRN
Start: 2021-10-02 — End: 2021-10-02

## 2021-10-02 MED ORDER — LIDOCAINE HCL 1 % IJ SOLN
1 % | INTRAMUSCULAR | Status: DC | PRN
Start: 2021-10-02 — End: 2021-10-02
  Administered 2021-10-02: 17:00:00 40 via INTRAVENOUS

## 2021-10-02 MED ORDER — HYDRALAZINE HCL 20 MG/ML IJ SOLN
20 MG/ML | INTRAMUSCULAR | Status: DC | PRN
Start: 2021-10-02 — End: 2021-10-02

## 2021-10-02 MED ORDER — MEPERIDINE HCL 50 MG/ML IJ SOLN
50 MG/ML | INTRAMUSCULAR | Status: DC | PRN
Start: 2021-10-02 — End: 2021-10-02

## 2021-10-02 MED ORDER — MIDAZOLAM HCL (PF) 2 MG/2ML IJ SOLN
2 MG/ML | Freq: Once | INTRAMUSCULAR | Status: DC | PRN
Start: 2021-10-02 — End: 2021-10-02

## 2021-10-02 MED ORDER — DIPHENHYDRAMINE HCL 50 MG/ML IJ SOLN
50 MG/ML | Freq: Once | INTRAMUSCULAR | Status: DC | PRN
Start: 2021-10-02 — End: 2021-10-02

## 2021-10-02 MED ORDER — METOCLOPRAMIDE HCL 5 MG/ML IJ SOLN
5 MG/ML | Freq: Once | INTRAMUSCULAR | Status: DC | PRN
Start: 2021-10-02 — End: 2021-10-02

## 2021-10-02 MED ORDER — GLYCOPYRROLATE 1 MG/5ML IJ SOLN
1 MG/5ML | INTRAMUSCULAR | Status: DC | PRN
Start: 2021-10-02 — End: 2021-10-02
  Administered 2021-10-02: 17:00:00 .3 via INTRAVENOUS

## 2021-10-02 MED ORDER — IPRATROPIUM-ALBUTEROL 0.5-2.5 (3) MG/3ML IN SOLN
Freq: Once | RESPIRATORY_TRACT | Status: DC | PRN
Start: 2021-10-02 — End: 2021-10-02

## 2021-10-02 MED ORDER — SODIUM CHLORIDE (PF) 0.9 % IJ SOLN
0.9 % | INTRAMUSCULAR | Status: AC
Start: 2021-10-02 — End: ?

## 2021-10-02 MED ORDER — PROPOFOL 500 MG/50ML IV EMUL
500 MG/50ML | INTRAVENOUS | Status: AC
Start: 2021-10-02 — End: ?

## 2021-10-02 MED ORDER — GLYCOPYRROLATE 1 MG/5ML IJ SOLN
1 MG/5ML | INTRAMUSCULAR | Status: AC
Start: 2021-10-02 — End: ?

## 2021-10-02 MED FILL — DIPRIVAN 200 MG/20ML IV EMUL: 200 MG/20ML | INTRAVENOUS | Qty: 20

## 2021-10-02 MED FILL — LIDOCAINE HCL 1 % IJ SOLN: 1 % | INTRAMUSCULAR | Qty: 2

## 2021-10-02 MED FILL — SODIUM CHLORIDE (PF) 0.9 % IJ SOLN: 0.9 % | INTRAMUSCULAR | Qty: 10

## 2021-10-02 MED FILL — DIPRIVAN 500 MG/50ML IV EMUL: 500 MG/50ML | INTRAVENOUS | Qty: 200

## 2021-10-02 MED FILL — GLYCOPYRROLATE 1 MG/5ML IJ SOLN: 1 MG/5ML | INTRAMUSCULAR | Qty: 5

## 2021-10-02 MED FILL — DIPRIVAN 500 MG/50ML IV EMUL: 500 MG/50ML | INTRAVENOUS | Qty: 50

## 2021-10-02 MED FILL — PHENYLEPHRINE HCL (PRESSORS) 10 MG/ML IV SOLN: 10 MG/ML | INTRAVENOUS | Qty: 1

## 2021-10-02 NOTE — Anesthesia Post-Procedure Evaluation (Signed)
Department of Anesthesiology  Postprocedure Note    Patient: Andres Johnson  MRN: [id number]  Birthdate: 1965/12/11  Date of evaluation: 10/02/2021      Procedure Summary     Date: 10/02/21 Room / Location: [facility]    Anesthesia Start: 1243 Anesthesia Stop: 3818    Procedure: COLORECTAL CANCER SCREENING, NOT HIGH RISK Diagnosis:       History of colon polyps      (History of colon polyps [Z86.010])    Surgeons: Georgina Peer, MD Responsible Provider: Mills Koller, MD    Anesthesia Type: MAC ASA Status: 3          Anesthesia Type: No value filed.    Aldrete Phase I: Aldrete Score: 9    Aldrete Phase II:        Anesthesia Post Evaluation    Patient location during evaluation: PACU  Patient participation: complete - patient participated  Level of consciousness: awake and alert  Airway patency: patent  Nausea & Vomiting: no nausea and no vomiting  Complications: no  Cardiovascular status: hemodynamically stable  Respiratory status: room air and spontaneous ventilation  Hydration status: euvolemic  Multimodal analgesia pain management approach  Pain management: adequate

## 2021-10-02 NOTE — Discharge Instructions (Addendum)
[facility]    POST-ENDOSCOPY INSTRUCTIONS    1. ACTIVITY  ____ No driving , operating machinery, or making important decisions for 24 hours.    Resume normal activity after 24 hours.  You may return to work after 24 hours.    __X__ Resume normal activity    2. DIET       _x___ (Colonscopy/Flex Sig):  Resume your usual diet unless specified below.       ____ Diet Modification:    3. MEDICATIONS (Do not consume alcohol, tranquilizers, or sleeping medications for           24 hours unless advised by your physician)       __x___ Resume your usual medications (except if taking blood thinners, hold for:   ____ days   ____week      4. PHYSICIAN FOLLOW-UP        __X__ Please call the office for an appointment/further instructions.    ______3 yrs   ___x___5 yrs   ______10 yrs       5. ADDITIONAL INSTRUCTIONS          6. NORMAL CHANGES YOU MAY EXPERIENCE AFTER ENDOSCOPY:         COLONOSCOPY  Passing of gas for several hours after procedure  Some mild abdominal cramping  If a biopsy/polypectomy was done, you may see some spotting of blood  You may feel fatigued for the next 24-48 hours due to the prep and sedation    7. CALL YOUR PHYSICIAN IF YOU EXPERIENCE ANY OF THE FOLLOWING      A.  Passing blood rectally or vomiting blood (color may be red or black)      B.  Severe abdominal pain or tenderness (that is not relieved by passing air)      C.  Fever, chills, or excessive sweating      D.  Persistent nausea or vomiting      E.  Redness or swelling at the IV site    If you have additional questions, PLEASE call your doctor [phone]

## 2021-10-02 NOTE — H&P (Signed)
Andres Johnson is an 56 y.o. Caucasian male, presents for screening colonoscopy. Last colonoscopy was 5 years ago during which two benign polyps were biopsied. Currently denies abdominal pain, rectal pain, change in bowel habits. Tolerated prep.    History reviewed. No pertinent past medical history.    Allergies: No Known Allergies    Active Problems:    * No active hospital problems. *  Resolved Problems:    * No resolved hospital problems. *    Blood pressure 132/72, pulse 62, temperature 96.9 F (36.1 C), temperature source Temporal, resp. rate 16, height 5\' 10"  (1.778 m), weight 263 lb (119.3 kg), SpO2 96 %.    ROS reviewed and negative unless otherwise mentioned above.    Physical Exam  Constitutional:       Appearance: Normal appearance.   HENT:      Head: Normocephalic and atraumatic.   Pulmonary:      Effort: Pulmonary effort is normal.      Breath sounds: No stridor. No wheezing.   Musculoskeletal:         General: Normal range of motion.   Skin:     General: Skin is warm and dry.   Neurological:      Mental Status: He is alert.   Psychiatric:         Mood and Affect: Mood normal.         Behavior: Behavior normal.         Assessment:  56 yo male presents for screening colonoscopy, possible polypectomy.     Plan:  Agree to proceed to the OR. Risks of the procedure were discussed with the patient, including bleeding, infection, perforation, stroke, MI, death. All questions answered. Informed consent obtained.    [place], DO  10/02/2021

## 2021-10-02 NOTE — Anesthesia Pre-Procedure Evaluation (Signed)
Department of Anesthesiology  Preprocedure Note       Name:  Andres Johnson   Age:  56 y.o.  DOB:  12-24-1965                                          MRN:  [id number]         Date:  10/02/2021      Surgeon: Juliann Mule):  Abed Thedore Mins, MD    Procedure: Procedure(s):  COLORECTAL CANCER SCREENING, NOT HIGH RISK    Medications prior to admission:   Prior to Admission medications    Medication Sig Start Date End Date Taking? Authorizing Provider   atenolol (TENORMIN) 50 MG tablet Take 1 tablet by mouth daily   Yes Historical Provider, MD   lisinopril (PRINIVIL;ZESTRIL) 10 MG tablet Take 1 tablet by mouth daily   Yes Historical Provider, MD   metFORMIN (GLUCOPHAGE) 500 MG tablet Take 1 tablet by mouth 2 times daily (with meals)   Yes Historical Provider, MD   aspirin 81 MG EC tablet Take 1 tablet by mouth daily   Yes Historical Provider, MD       Current medications:    Current Facility-Administered Medications   Medication Dose Route Frequency Provider Last Rate Last Admin   . lactated ringers IV soln infusion   IntraVENous Continuous Colette Ribas, MD       . sodium chloride flush 0.9 % injection 5-40 mL  5-40 mL IntraVENous 2 times per day Colette Ribas, MD       . sodium chloride flush 0.9 % injection 5-40 mL  5-40 mL IntraVENous PRN Colette Ribas, MD       . 0.9 % sodium chloride infusion   IntraVENous PRN Colette Ribas, MD           Allergies:  No Known Allergies    Problem List:  There is no problem list on file for this patient.      Past Medical History:  History reviewed. No pertinent past medical history.    Past Surgical History:  History reviewed. No pertinent surgical history.    Social History:    Social History     Tobacco Use   . Smoking status: Not on file   . Smokeless tobacco: Not on file   Substance Use Topics   . Alcohol use: Not on file                                Counseling given: Not Answered      Vital Signs (Current):   Vitals:    10/02/21 1117   BP: 132/72   Pulse: 62   Resp: 16   Temp: 96.9 F (36.1 C)   TempSrc:  Temporal   SpO2: 96%   Weight: 263 lb (119.3 kg)   Height: _0  (1.778 m)                                              BP Readings from Last 3 Encounters:   10/02/21 132/72       NPO Status: Time of last liquid consumption: 2100  Time of last solid consumption: 2100                        Date of last liquid consumption: 10/01/21                        Date of last solid food consumption: 10/01/21    BMI:   Wt Readings from Last 3 Encounters:   10/02/21 263 lb (119.3 kg)     Body mass index is 37.74 kg/m.    CBC: No results found for: "WBC", "RBC", "HGB", "HCT", "MCV", "RDW", "PLT"    CMP: No results found for: "NA", "K", "CL", "CO2", "BUN", "CREATININE", "GFRAA", "AGRATIO", "LABGLOM", "GLUCOSE", "GLU", "PROT", "CALCIUM", "BILITOT", "ALKPHOS", "AST", "ALT"    POC Tests: No results for input(s): "POCGLU", "POCNA", "POCK", "POCCL", "POCBUN", "POCHEMO", "POCHCT" in the last 72 hours.    Coags: No results found for: "PROTIME", "INR", "APTT"    HCG (If Applicable): No results found for: "PREGTESTUR", "PREGSERUM", "HCG", "HCGQUANT"     ABGs: No results found for: "PHART", "PO2ART", "PCO2ART", "HCO3ART", "BEART", "O2SATART"     Type & Screen (If Applicable):  No results found for: "LABABO", "LABRH"    Drug/Infectious Status (If Applicable):  No results found for: "HIV", "HEPCAB"    COVID-19 Screening (If Applicable): No results found for: "COVID19"        Anesthesia Evaluation  Patient summary reviewed and Nursing notes reviewed  Airway: Mallampati: IV  TM distance: >3 FB   Neck ROM: full  Mouth opening: > = 3 FB   Dental: normal exam         Pulmonary:normal exam    (+) COPD:  sleep apnea: on CPAP,                            ROS comment: -QUIT SMOKING 2014   Cardiovascular:    (+) hypertension:,                   Neuro/Psych:   Negative Neuro/Psych ROS              GI/Hepatic/Renal:   (+) morbid obesity          Endo/Other:    (+) Diabetes, .                  ROS comment: -NPO AFTER  MIDNIGHT  -NKDA Abdominal: normal exam            Vascular: negative vascular ROS.         Other Findings:           Anesthesia Plan      MAC     ASA 3       Induction: intravenous.    MIPS: Postoperative opioids intended and Prophylactic antiemetics administered.  Anesthetic plan and risks discussed with patient.      Plan discussed with CRNA.    Attending anesthesiologist reviewed and agrees with Preprocedure content                Mills Koller, MD   10/02/2021

## 2021-10-02 NOTE — Op Note (Signed)
Operative Note      Patient: Andres Johnson  Date of Birth: 1965-03-14  MRN: [id number]    Date of Procedure: 10/24/21    Pre-Op Diagnosis Codes:     * History of colon polyps [Z86.010]    Post-Op Diagnosis: Same       Procedure(s):  COLORECTAL CANCER SCREENING, NOT HIGH RISK    Surgeon(s):  Abed Thedore Mins, MD    Assistant:   Resident: Johnston Ebbs, DO    Anesthesia: Monitor Anesthesia Care    Estimated Blood Loss (mL): NONE    Complications: None    Specimens:   * No specimens in log *    Implants:  * No implants in log *      Drains: * No LDAs found *    Findings: poor bowel prep, normal appearing colorectal mucosa, grade I internal hemorrhoids    Detailed Description of Procedure:     SPECIMENS: were not obtained    HISTORY: The patient is a 56 y.o. year old male with history of above preop diagnosis.  Colonoscopy with possible biopsy or polypectomy has been recommended and I explained the risk, benefits, expected outcome, and alternatives to the procedure.  Risks included but are not limited to bleeding, infection, respiratory distress, hypotension, and perforation of the colon.  The patient understands and is in agreement.        PROCEDURE: The patient was given monitored anesthesia care. The patient was given oxygen by nasal cannula. The colonoscope was inserted per rectum and advanced under direct vision to the cecum without difficulty.     Findings:  Cecum/Ascending colon: normal    Transverse colon: normal    Descending/Sigmoid colon: normal    Rectum/Anus: normal, Grade I internal hemorrhoids    The colon was decompressed and the scope was removed. The patient tolerated the procedure well.     Recommendations:  Repeat screening colonoscopy in 5 years due to history of polyps.       Electronically signed by Johnston Ebbs, DO on 2021-10-24 at 1:12 PM

## 2022-11-11 LAB — COMPREHENSIVE METABOLIC PANEL
ALT: 30 U/L
AST: 26 U/L
Albumin: 4.6 g/dL
Alk Phosphatase: 56 U/L
BUN: 17 mg/dL
CO2: 21 MMOL/L — ABNORMAL LOW
Calcium: 9.3 mg/dL
Chloride: 105 MMOL/L
Creatinine: 0.79 mg/dL
GFR African American: 122.8 ML/M1.7
GFR Non-African American: 101.5 ML/M1.7
Glucose: 163 mg/dL — ABNORMAL HIGH
Potassium: 4.6 MMOL/L
Sodium: 136 MMOL/L — ABNORMAL LOW
Total Bilirubin: 0.3 mg/dL
Total Protein: 6.9 g/dL

## 2022-11-11 LAB — CBC WITH AUTO DIFFERENTIAL
Basophils %: 0.6 %
Basophils Absolute: 0.04 10*3/uL
Eosinophils %: 3.9 %
Eosinophils Absolute: 0.25 10*3/uL
Hematocrit: 40 % — ABNORMAL LOW
Hemoglobin: 13.3 g/dL — ABNORMAL LOW
IMMATURE GRANULOCYTES ABSOLUTE: 0.01 10*3/uL
Immature Granulocytes %: 0.2 %
Lymphocytes %: 37.2 %
Lymphocytes Absolute: 2.36 10*3/uL
MCH: 29.6 pg
MCHC: 33.3 g/dL
MCV: 89.1 fL
Monocytes %: 9.6 %
Monocytes Absolute: 0.61 10*3/uL
Neutrophils %: 48.5 %
Neutrophils Absolute: 3.07 10*3/uL
Platelets: 206 10*3/uL
RBC: 4.49 10*6/uL — ABNORMAL LOW
RDW-SD: 44.2 fL
WBC: 6.34 10*3/uL

## 2022-11-11 LAB — LIPID PANEL
Chol/HDL Ratio: 5
Cholesterol, Total: 146 mg/dL
HDL: 29 mg/dL — ABNORMAL LOW
LDL Cholesterol: 45 mg/dL
Triglycerides: 358 mg/dL — ABNORMAL HIGH
VLDL: 72 mg/dL — ABNORMAL HIGH

## 2022-11-11 LAB — MICROALBUMIN / CREATININE URINE RATIO
Creatinine, Ur: 83.3 mg/dL
Microalb, Ur: 0.83 mg/dL
Microalb/Creat Ratio: 10 mg/g{creat}

## 2022-11-11 LAB — PSA PROSTATIC SPECIFIC ANTIGEN: PSA: 0.57 ng/mL

## 2023-11-19 LAB — PSA PROSTATIC SPECIFIC ANTIGEN: PSA: 0.87 ng/mL

## 2023-11-19 LAB — CBC WITH AUTO DIFFERENTIAL
Basophils %: 0.8 %
Basophils: 0.05 x10^3ul
Eosinophils %: 3.5 %
Eosinophils: 0.23 x10^3ul
Hematocrit: 41.1 % — ABNORMAL LOW
Hemoglobin: 13.6 g/dL — ABNORMAL LOW
Immature Granulocytes %: 0.01 x10^3ul
Immature Granulocytes %: 0.2 %
Lymphocytes %: 31.3 %
Lymphocytes: 2.05 x10^3ul
MCH: 28.9 pg
MCHC: 33.1 g/dL
MCV: 87.4 fl
Monocytes %: 8.7 %
Monocytes: 0.57 x10^3ul
Neutrophils %: 55.5 %
Neutrophils: 3.63 x10^3ul
Platelets: 199 x10^3ul
RBC: 4.7 x10^6ul
RDW-SD: 43.3 fl
WBC: 6.54 x10^3ul

## 2023-11-19 LAB — COMPREHENSIVE METABOLIC PANEL
ALT: 40 U/L
AST: 37 U/L
Albumin: 4.6 g/dL
Alk Phosphatase: 48 U/L
BUN: 17 mg/dL
CO2: 21 MMOL/L — ABNORMAL LOW
Calcium: 9.4 mg/dL
Chloride: 100 MMOL/L
Creatinine: 0.86 mg/dL
Glucose: 162 mg/dL — ABNORMAL HIGH
Potassium: 4.8 MMOL/L
Sodium: 136 MMOL/L
Total Bilirubin: 0.5 mg/dL
Total Protein: 7.1 g/dL
eGFR (CKD-EPI): 101 ML/M1.7

## 2023-11-19 LAB — LIPID PANEL
Chol/HDL Ratio: 5.3 — ABNORMAL HIGH
Cholesterol, Total: 157 mg/dL
HDL: 29.6 mg/dL — ABNORMAL LOW
Triglycerides: 465 mg/dL — ABNORMAL HIGH
VLDL: 93 mg/dL — ABNORMAL HIGH

## 2023-11-19 LAB — ALBUMIN, RANDOM URINE
Albumin Urine: 10.63 mg/dL — ABNORMAL HIGH
Albumin/Creatinine Ratio: 77 mg/g{creat} — ABNORMAL HIGH
Creatinine, Ur: 138.1 mg/dL
